# Patient Record
Sex: Male | Born: 1955 | Race: White | Hispanic: No | Marital: Married | Smoking: Never smoker
Health system: Southern US, Community
[De-identification: ages and names within clinical notes are randomized; demographics above are authoritative.]

## PROBLEM LIST (undated history)

## (undated) DIAGNOSIS — M1990 Unspecified osteoarthritis, unspecified site: Secondary | ICD-10-CM

## (undated) DIAGNOSIS — K219 Gastro-esophageal reflux disease without esophagitis: Secondary | ICD-10-CM

## (undated) DIAGNOSIS — E781 Pure hyperglyceridemia: Secondary | ICD-10-CM

## (undated) DIAGNOSIS — G4730 Sleep apnea, unspecified: Secondary | ICD-10-CM

## (undated) DIAGNOSIS — E785 Hyperlipidemia, unspecified: Secondary | ICD-10-CM

## (undated) DIAGNOSIS — I10 Essential (primary) hypertension: Secondary | ICD-10-CM

## (undated) DIAGNOSIS — T7840XA Allergy, unspecified, initial encounter: Secondary | ICD-10-CM

## (undated) DIAGNOSIS — E119 Type 2 diabetes mellitus without complications: Secondary | ICD-10-CM

## (undated) HISTORY — DX: Type 2 diabetes mellitus without complications: E11.9

## (undated) HISTORY — DX: Unspecified osteoarthritis, unspecified site: M19.90

## (undated) HISTORY — DX: Pure hyperglyceridemia: E78.1

## (undated) HISTORY — PX: VASECTOMY: SHX75

## (undated) HISTORY — DX: Hyperlipidemia, unspecified: E78.5

## (undated) HISTORY — DX: Allergy, unspecified, initial encounter: T78.40XA

## (undated) HISTORY — DX: Essential (primary) hypertension: I10

## (undated) HISTORY — DX: Sleep apnea, unspecified: G47.30

## (undated) HISTORY — DX: Gastro-esophageal reflux disease without esophagitis: K21.9

---

## 2002-11-18 HISTORY — PX: KNEE SURGERY: SHX244

## 2017-01-31 ENCOUNTER — Encounter: Payer: Self-pay | Admitting: Medical

## 2017-01-31 ENCOUNTER — Ambulatory Visit: Payer: Self-pay | Admitting: Medical

## 2017-01-31 VITALS — BP 124/84 | HR 81 | Temp 97.7°F | Resp 16 | Ht 71.0 in | Wt 240.0 lb

## 2017-01-31 DIAGNOSIS — I10 Essential (primary) hypertension: Secondary | ICD-10-CM

## 2017-01-31 DIAGNOSIS — E119 Type 2 diabetes mellitus without complications: Secondary | ICD-10-CM | POA: Insufficient documentation

## 2017-01-31 DIAGNOSIS — E1165 Type 2 diabetes mellitus with hyperglycemia: Secondary | ICD-10-CM | POA: Insufficient documentation

## 2017-01-31 DIAGNOSIS — Z760 Encounter for issue of repeat prescription: Secondary | ICD-10-CM

## 2017-01-31 DIAGNOSIS — R7303 Prediabetes: Secondary | ICD-10-CM

## 2017-01-31 MED ORDER — RAMIPRIL 5 MG PO CAPS: 5.0000 mg | ORAL_CAPSULE | Freq: Every day | ORAL | 0 refills | Status: DC

## 2017-01-31 MED ORDER — METFORMIN HCL ER 500 MG PO TB24: 500.0000 mg | ORAL_TABLET | Freq: Every day | ORAL | 0 refills | Status: DC

## 2017-01-31 MED ORDER — AMLODIPINE BESYLATE 10 MG PO TABS: 10.0000 mg | ORAL_TABLET | Freq: Every day | ORAL | 0 refills | Status: DC

## 2017-01-31 NOTE — Progress Notes (Signed)
   Subjective:    Patient ID: Cory Houston, male    DOB: 1956-08-12, 61 y.o.   MRN: [id number]  HPI Here for refill on bp pressure and prediabetic medication. New to [place] moving from [place]. 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     Review of Systems  Constitutional: Negative for chills and fever.  HENT: Negative for postnasal drip, rhinorrhea and sinus pressure.   Respiratory: Negative.   Cardiovascular: Negative.        Objective:   Physical Exam  Constitutional: He appears well-developed and well-nourished.  HENT:  Head: Normocephalic and atraumatic.  Eyes: EOM are normal. Pupils are equal, round, and reactive to light.  Neck: Normal range of motion.  Cardiovascular: Normal rate and regular rhythm.  Exam reveals no gallop and no friction rub.   No murmur heard. Pulmonary/Chest: Effort normal and breath sounds normal.          Assessment & Plan:  Refills completed for 90 days. Altace, metformin, amlodipine. All once a day. Pt setting up blood work next Thursday  And then will set up  primary care appointment. Return to the clinic as needed.

## 2017-02-06 ENCOUNTER — Other Ambulatory Visit: Payer: Self-pay

## 2017-02-06 DIAGNOSIS — R7303 Prediabetes: Secondary | ICD-10-CM

## 2017-02-06 DIAGNOSIS — Z0000 Encounter for general adult medical examination without abnormal findings: Secondary | ICD-10-CM

## 2017-02-06 DIAGNOSIS — I10 Essential (primary) hypertension: Secondary | ICD-10-CM

## 2017-02-06 LAB — POCT URINALYSIS DIPSTICK
Bilirubin, UA: NEGATIVE
Blood, UA: NEGATIVE
Glucose, UA: NEGATIVE
Ketones, UA: NEGATIVE
LEUKOCYTES UA: NEGATIVE
NITRITE UA: NEGATIVE
PROTEIN UA: NEGATIVE
Spec Grav, UA: 1.015 (ref 1.030–1.035)
UROBILINOGEN UA: 0.2 (ref ?–2.0)
pH, UA: 5 (ref 5.0–8.0)

## 2017-02-08 LAB — CMP12+LP+TP+TSH+6AC+PSA+CBC…
ALBUMIN: 4.9 g/dL (ref 3.5–5.5)
ALT: 81 IU/L — AB (ref 0–44)
AST: 40 IU/L (ref 0–40)
Albumin/Globulin Ratio: 1.8 (ref 1.2–2.2)
Alkaline Phosphatase: 163 IU/L — ABNORMAL HIGH (ref 39–117)
BILIRUBIN TOTAL: 0.5 mg/dL (ref 0.0–1.2)
BUN/Creatinine Ratio: 11 (ref 9–20)
BUN: 11 mg/dL (ref 6–24)
Basophils Absolute: 0 10*3/uL (ref 0.0–0.2)
Basos: 0 %
CALCIUM: 9.4 mg/dL (ref 8.7–10.2)
CHLORIDE: 98 mmol/L (ref 96–106)
CHOLESTEROL TOTAL: 192 mg/dL (ref 100–199)
Chol/HDL Ratio: 7.4 ratio units — ABNORMAL HIGH (ref 0.0–5.0)
Creatinine, Ser: 0.96 mg/dL (ref 0.76–1.27)
EOS (ABSOLUTE): 0.2 10*3/uL (ref 0.0–0.4)
Eos: 2 %
Estimated CHD Risk: 1.5 times avg. — ABNORMAL HIGH (ref 0.0–1.0)
FREE THYROXINE INDEX: 2.1 (ref 1.2–4.9)
GFR calc Af Amer: 100 mL/min/{1.73_m2} (ref >59)
GFR calc non Af Amer: 87 mL/min/{1.73_m2} (ref >59)
GGT: 29 IU/L (ref 0–65)
GLOBULIN, TOTAL: 2.8 g/dL (ref 1.5–4.5)
Glucose: 226 mg/dL — ABNORMAL HIGH (ref 65–99)
HDL: 26 mg/dL — ABNORMAL LOW (ref >39)
Hematocrit: 48 % (ref 37.5–51.0)
Hemoglobin: 16.6 g/dL (ref 13.0–17.7)
IRON: 96 ug/dL (ref 38–169)
Immature Grans (Abs): 0 10*3/uL (ref 0.0–0.1)
Immature Granulocytes: 0 %
LDH: 235 IU/L — ABNORMAL HIGH (ref 121–224)
LDL Calculated: 112 mg/dL — ABNORMAL HIGH (ref 0–99)
LYMPHS ABS: 1.4 10*3/uL (ref 0.7–3.1)
Lymphs: 21 %
MCH: 30.1 pg (ref 26.6–33.0)
MCHC: 34.6 g/dL (ref 31.5–35.7)
MCV: 87 fL (ref 79–97)
MONOS ABS: 0.5 10*3/uL (ref 0.1–0.9)
Monocytes: 7 %
NEUTROS ABS: 4.6 10*3/uL (ref 1.4–7.0)
Neutrophils: 70 %
PLATELETS: 205 10*3/uL (ref 150–379)
POTASSIUM: 4.5 mmol/L (ref 3.5–5.2)
PROSTATE SPECIFIC AG, SERUM: 0.3 ng/mL (ref 0.0–4.0)
Phosphorus: 2.5 mg/dL (ref 2.5–4.5)
RBC: 5.52 x10E6/uL (ref 4.14–5.80)
RDW: 13.9 % (ref 12.3–15.4)
Sodium: 137 mmol/L (ref 134–144)
T3 UPTAKE RATIO: 26 % (ref 24–39)
T4, Total: 8.1 ug/dL (ref 4.5–12.0)
TOTAL PROTEIN: 7.7 g/dL (ref 6.0–8.5)
TRIGLYCERIDES: 270 mg/dL — AB (ref 0–149)
TSH: 1.61 u[IU]/mL (ref 0.450–4.500)
Uric Acid: 5 mg/dL (ref 3.7–8.6)
VLDL Cholesterol Cal: 54 mg/dL — ABNORMAL HIGH (ref 5–40)
WBC: 6.7 10*3/uL (ref 3.4–10.8)

## 2017-02-08 LAB — VITAMIN D 25 HYDROXY (VIT D DEFICIENCY, FRACTURES): Vit D, 25-Hydroxy: 22.6 ng/mL — ABNORMAL LOW (ref 30.0–100.0)

## 2017-02-08 LAB — HGB A1C W/O EAG: HEMOGLOBIN A1C: 8.8 % — AB (ref 4.8–5.6)

## 2017-02-14 ENCOUNTER — Telehealth: Payer: Self-pay

## 2017-02-14 NOTE — Telephone Encounter (Signed)
See summary above.

## 2017-02-19 ENCOUNTER — Ambulatory Visit: Payer: Self-pay | Admitting: Medical

## 2017-02-19 ENCOUNTER — Encounter: Payer: Self-pay | Admitting: Medical

## 2017-02-19 VITALS — BP 120/78 | HR 93 | Temp 97.2°F | Ht 71.0 in | Wt 240.0 lb

## 2017-02-19 DIAGNOSIS — Z0000 Encounter for general adult medical examination without abnormal findings: Secondary | ICD-10-CM

## 2017-02-19 DIAGNOSIS — E119 Type 2 diabetes mellitus without complications: Secondary | ICD-10-CM

## 2017-02-19 DIAGNOSIS — G4730 Sleep apnea, unspecified: Secondary | ICD-10-CM

## 2017-02-19 DIAGNOSIS — I10 Essential (primary) hypertension: Secondary | ICD-10-CM

## 2017-02-19 DIAGNOSIS — E782 Mixed hyperlipidemia: Secondary | ICD-10-CM

## 2017-02-19 DIAGNOSIS — Z23 Encounter for immunization: Secondary | ICD-10-CM

## 2017-02-19 NOTE — Progress Notes (Addendum)
No addendum made, dx has patient as my primary care patient.  Subjective:    Patient ID: Cory Houston, male    DOB: 04/25/1956, 61 y.o.   MRN: [id number]  HPI 61 yo comes into day for primary physical and review of labs. 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    Review of Systems  Constitutional: Negative for chills, fatigue and fever.  HENT: Positive for nosebleeds. Negative for congestion, dental problem, ear pain and sneezing.   Eyes: Negative.   Respiratory: Negative.   Cardiovascular: Negative.   Gastrointestinal: Negative.   Endocrine: Negative.   Genitourinary: Negative.   Musculoskeletal: Negative.   Skin: Negative.   Neurological: Negative.   Hematological: Negative.   Psychiatric/Behavioral: Negative.   nosebleeds during the winter with dry heat.     Objective:   Physical Exam  Constitutional: He is oriented to person, place, and time. He appears well-developed and well-nourished.  HENT:  Head: Normocephalic and atraumatic.  Right Ear: External ear normal.  Left Ear: External ear normal.  Nose: Nose normal.  Mouth/Throat: Oropharynx is clear and moist.  Eyes: Conjunctivae and EOM are normal. Pupils are equal, round, and reactive to light. No scleral icterus.  Neck: Normal range of motion. Neck supple. No JVD present.  Cardiovascular: Normal rate, regular rhythm, normal heart sounds and intact distal pulses.  Exam reveals no gallop and no friction rub.   No murmur heard. Pulmonary/Chest: Effort normal and breath sounds normal.  Abdominal: Soft. Bowel sounds are normal.  Musculoskeletal: Normal range of motion.  Lymphadenopathy:    He has no cervical adenopathy.  Neurological: He is alert and oriented to person, place, and time.  Skin: Skin is warm and dry.  Nursing note and vitals reviewed.   Ventral hernia nontender , superior to umbilicus, soft and can be reduced.      Assessment & Plan:  Diabetes mellitus type II on  metformin XR 500mg  / day, elevated glucose and A1C 8.8 previously classified as prediabetic. Will set up referral for lifestyles counseling at  [facility]. Hypertension well controlled on Amlodipine and Ramipril. Hypertriglyceridemia  add Fish oil 3000 mg / day recheck in  6 months Obesity to follow heart healthy low cal diet and exercise at least 3 times/week.. Low vitamin D (22.6 ng/ml) to take otc vitamin D3 4000 IU/day recheck in one year. Sleep apnea will get patient retested last time was 15 yrs ago. Will refer for  sleep study to update patient diagnosis and treatment plan. Tdap updated. Recheck of A1C  And lipid panel and Comprehensive panel in 3 months and then follow up in clinic with me for recheck.

## 2017-02-20 MED ORDER — TETANUS-DIPHTH-ACELL PERTUSSIS 5-2.5-18.5 LF-MCG/0.5 IM SUSP
0.5000 mL | Freq: Once | INTRAMUSCULAR | Status: AC
  Administered 2017-02-19: 0.5 mL via INTRAMUSCULAR

## 2017-02-20 NOTE — Telephone Encounter (Signed)
Cory Houston stopped in the clinic to discuss lab results before going on a trip. Reassured lab results were ok and the PA would discuss them in more detail at his visit next week.

## 2017-02-24 NOTE — Progress Notes (Signed)
02/24/17 confirmed with pt that [facility] had contacted him about his sleep study coming up on 4/24. He is aware.  BA

## 2017-03-06 ENCOUNTER — Ambulatory Visit: Payer: Self-pay | Admitting: *Deleted

## 2017-03-10 ENCOUNTER — Encounter: Payer: BLUE CROSS/BLUE SHIELD | Attending: Medical | Admitting: *Deleted

## 2017-03-10 ENCOUNTER — Encounter: Payer: Self-pay | Admitting: *Deleted

## 2017-03-10 VITALS — BP 110/76 | Ht 71.0 in | Wt 235.0 lb

## 2017-03-10 DIAGNOSIS — E119 Type 2 diabetes mellitus without complications: Secondary | ICD-10-CM | POA: Diagnosis not present

## 2017-03-10 DIAGNOSIS — Z713 Dietary counseling and surveillance: Secondary | ICD-10-CM | POA: Diagnosis not present

## 2017-03-10 NOTE — Patient Instructions (Signed)
Check blood sugars 2 x day before breakfast and 2 hrs after supper every day  Exercise: Continue walking and weights for   40  minutes   3-7  days a week  Eat 3 meals day,  1-2  snacks a day Space meals 4-6 hours apart  Make an eye doctor appointment  Bring blood sugar records to the next /class  Return for classes on:

## 2017-03-11 ENCOUNTER — Ambulatory Visit: Payer: BLUE CROSS/BLUE SHIELD

## 2017-03-11 NOTE — Progress Notes (Signed)
Diabetes Self-Management Education  Visit Type: First/Initial  Appt. Start Time: 1605 Appt. End Time: 1705  03/10/2017  Mr. Cory Houston, identified by name and date of birth, is a 61 y.o. male with a diagnosis of Diabetes: Type 2.   ASSESSMENT  Blood pressure 110/76, height 5\' 11"  (1.803 m), weight 235 lb (106.6 kg). Body mass index is 32.78 kg/m.      Diabetes Self-Management Education - 03/10/17 1720      Visit Information   Visit Type First/Initial     Initial Visit   Diabetes Type Type 2   Are you currently following a meal plan? Yes   What type of meal plan do you follow? less carbs   Are you taking your medications as prescribed? Yes   Date Diagnosed 3 weeks     Health Coping   How would you rate your overall health? Good     Psychosocial Assessment   Patient Belief/Attitude about Diabetes Motivated to manage diabetes  "disappointed in myself"   Self-care barriers None   Self-management support Doctor's office;Family   Patient Concerns Nutrition/Meal planning;Medication;Monitoring;Healthy Lifestyle;Problem Solving;Glycemic Control;Weight Control   Special Needs None   Preferred Learning Style Hands on   Learning Readiness Change in progress   How often do you need to have someone help you when you read instructions, pamphlets, or other written materials from your doctor or pharmacy? 1 - Never   What is the last grade level you completed in school? BS degree     Pre-Education Assessment   Patient understands the diabetes disease and treatment process. Needs Instruction   Patient understands incorporating nutritional management into lifestyle. Needs Instruction   Patient undertands incorporating physical activity into lifestyle. Needs Review   Patient understands using medications safely. Needs Instruction   Patient understands monitoring blood glucose, interpreting and using results Needs Review   Patient understands prevention, detection, and treatment of  acute complications. Needs Instruction   Patient understands prevention, detection, and treatment of chronic complications. Needs Instruction   Patient understands how to develop strategies to address psychosocial issues. Needs Instruction   Patient understands how to develop strategies to promote health/change behavior. Needs Instruction     Complications   Last HgB A1C per patient/outside source 8.8 %  02/06/17   How often do you check your blood sugar? 1-2 times/day   Fasting Blood glucose range (mg/dL) 130-179  pt reports FBG's 130-135 mg/dL   Postprandial Blood glucose range (mg/dL) 130-179  pt reports pp 170's mg/dL   Have you had a dilated eye exam in the past 12 months? No   Have you had a dental exam in the past 12 months? Yes   Are you checking your feet? No     Dietary Intake   Breakfast Greek yogurt with berries; egg with cheese and wheat thins   Snack (morning) [place] yogurt with granola and berries   Lunch grilled chicken or pulled pork with zucchini, squash, asparagus, carrots, broccoli   Dinner same as lunch   Snack (evening) nuts   Beverage(s) water, diet Coke     Exercise   Exercise Type Moderate (swimming / aerobic walking);Light (walking / raking leaves)  walks 8-12 miles per day on his job; weight training 3 x week   How many days per week to you exercise? 3   How many minutes per day do you exercise? 40   Total minutes per week of exercise 120     Patient Education   Previous Diabetes  Education No   Disease state  Definition of diabetes, type 1 and 2, and the diagnosis of diabetes;Factors that contribute to the development of diabetes   Nutrition management  Role of diet in the treatment of diabetes and the relationship between the three main macronutrients and blood glucose level;Reviewed blood glucose goals for pre and post meals and how to evaluate the patients' food intake on their blood glucose level.   Physical activity and exercise  Role of exercise on  diabetes management, blood pressure control and cardiac health.   Medications Reviewed patients medication for diabetes, action, purpose, timing of dose and side effects.   Monitoring Taught/discussed recording of test results and interpretation of SMBG.;Identified appropriate SMBG and/or A1C goals.   Chronic complications Relationship between chronic complications and blood glucose control   Psychosocial adjustment Identified and addressed patients feelings and concerns about diabetes     Individualized Goals (developed by patient)   Reducing Risk Improve blood sugars Decrease medications Prevent diabetes complications Lose weight Lead a healthier lifestyle Become more fit     Outcomes   Expected Outcomes Demonstrated interest in learning. Expect positive outcomes   Future DMSE 2 wks      Individualized Plan for Diabetes Self-Management Training:   Learning Objective:  Patient will have a greater understanding of diabetes self-management. Patient education plan is to attend individual and/or group sessions per assessed needs and concerns.   Plan:   Patient Instructions  Check blood sugars 2 x day before breakfast and 2 hrs after supper every day Exercise: Continue walking and weights for   40  minutes   3-7  days a week Eat 3 meals day,  1-2  snacks a day Space meals 4-6 hours apart Make an eye doctor appointment Bring blood sugar records to the next /class  Expected Outcomes:  Demonstrated interest in learning. Expect positive outcomes  Education material provided:  General Meal Planning Guidelines Simple Meal Plan  If problems or questions, patient to contact team via:  Johny Drilling, RN, [place], CDE [phone]  Future DSME appointment: 2 wks  March 17, 2017 for Diabetes Class 1

## 2017-03-17 ENCOUNTER — Encounter: Payer: BLUE CROSS/BLUE SHIELD | Admitting: Dietician

## 2017-03-17 ENCOUNTER — Encounter: Payer: Self-pay | Admitting: Dietician

## 2017-03-17 VITALS — Ht 71.0 in | Wt 233.3 lb

## 2017-03-17 DIAGNOSIS — Z713 Dietary counseling and surveillance: Secondary | ICD-10-CM | POA: Diagnosis not present

## 2017-03-17 DIAGNOSIS — E119 Type 2 diabetes mellitus without complications: Secondary | ICD-10-CM

## 2017-03-17 NOTE — Progress Notes (Signed)

## 2017-03-24 ENCOUNTER — Encounter: Payer: BLUE CROSS/BLUE SHIELD | Attending: Medical | Admitting: Dietician

## 2017-03-24 VITALS — Wt 234.0 lb

## 2017-03-24 DIAGNOSIS — E119 Type 2 diabetes mellitus without complications: Secondary | ICD-10-CM | POA: Diagnosis not present

## 2017-03-24 DIAGNOSIS — Z713 Dietary counseling and surveillance: Secondary | ICD-10-CM | POA: Insufficient documentation

## 2017-03-24 NOTE — Progress Notes (Signed)

## 2017-03-31 ENCOUNTER — Encounter: Payer: Self-pay | Admitting: Dietician

## 2017-03-31 ENCOUNTER — Encounter: Payer: BLUE CROSS/BLUE SHIELD | Admitting: Dietician

## 2017-03-31 VITALS — BP 126/74 | Ht 71.0 in | Wt 228.4 lb

## 2017-03-31 DIAGNOSIS — E119 Type 2 diabetes mellitus without complications: Secondary | ICD-10-CM

## 2017-03-31 DIAGNOSIS — Z713 Dietary counseling and surveillance: Secondary | ICD-10-CM | POA: Diagnosis not present

## 2017-03-31 NOTE — Progress Notes (Signed)

## 2017-04-03 ENCOUNTER — Encounter: Payer: Self-pay | Admitting: *Deleted

## 2017-04-22 ENCOUNTER — Other Ambulatory Visit: Payer: Self-pay | Admitting: Medical

## 2017-04-23 ENCOUNTER — Other Ambulatory Visit: Payer: Self-pay | Admitting: Medical

## 2017-04-23 DIAGNOSIS — E119 Type 2 diabetes mellitus without complications: Secondary | ICD-10-CM

## 2017-04-23 MED ORDER — METFORMIN HCL ER 500 MG PO TB24: 500.0000 mg | ORAL_TABLET | Freq: Two times a day (BID) | ORAL | 0 refills | Status: DC

## 2017-04-23 NOTE — Telephone Encounter (Signed)
Patient returned my call. To have recheck of A1C in July 23 and lipid panel and kidney function ( will most likely start on lipid medication if elevated). He found Lifestyles  Counseling very helpful and has  Lost  20 lbs since his visit with me in April.  He is walking  8-12 miles /day for his exercise and he is watching his diet.appointment scheduled for follow up today.

## 2017-05-05 ENCOUNTER — Other Ambulatory Visit: Payer: Self-pay | Admitting: Medical

## 2017-05-05 DIAGNOSIS — I10 Essential (primary) hypertension: Secondary | ICD-10-CM

## 2017-05-05 MED ORDER — AMLODIPINE BESYLATE 10 MG PO TABS: 10.0000 mg | ORAL_TABLET | Freq: Every day | ORAL | 0 refills | Status: DC

## 2017-05-06 ENCOUNTER — Other Ambulatory Visit: Payer: Self-pay | Admitting: Medical

## 2017-05-06 DIAGNOSIS — I10 Essential (primary) hypertension: Secondary | ICD-10-CM

## 2017-05-06 MED ORDER — RAMIPRIL 5 MG PO CAPS: 5.0000 mg | ORAL_CAPSULE | Freq: Every day | ORAL | 0 refills | Status: DC

## 2017-05-06 NOTE — Telephone Encounter (Signed)
3 month refill on ramipril and amlodipine. It is time to check his A1C and recheck on his bp. Will have Belinda call patient for follow up appointment.

## 2017-05-07 ENCOUNTER — Encounter: Payer: Self-pay | Admitting: Medical

## 2017-06-06 ENCOUNTER — Other Ambulatory Visit: Payer: Self-pay

## 2017-06-09 ENCOUNTER — Ambulatory Visit: Payer: Self-pay | Admitting: Medical

## 2017-06-09 ENCOUNTER — Other Ambulatory Visit: Payer: Self-pay

## 2017-06-12 ENCOUNTER — Other Ambulatory Visit: Payer: Self-pay

## 2017-06-12 DIAGNOSIS — E782 Mixed hyperlipidemia: Secondary | ICD-10-CM

## 2017-06-12 DIAGNOSIS — E119 Type 2 diabetes mellitus without complications: Secondary | ICD-10-CM

## 2017-06-13 LAB — LIPID PANEL
Chol/HDL Ratio: 6.6 ratio — ABNORMAL HIGH (ref 0.0–5.0)
Cholesterol, Total: 178 mg/dL (ref 100–199)
HDL: 27 mg/dL — AB (ref >39)
LDL CALC: 118 mg/dL — AB (ref 0–99)
Triglycerides: 167 mg/dL — ABNORMAL HIGH (ref 0–149)
VLDL CHOLESTEROL CAL: 33 mg/dL (ref 5–40)

## 2017-06-13 LAB — HEMOGLOBIN A1C
ESTIMATED AVERAGE GLUCOSE: 140 mg/dL
Hgb A1c MFr Bld: 6.5 % — ABNORMAL HIGH (ref 4.8–5.6)

## 2017-06-17 ENCOUNTER — Encounter: Payer: Self-pay | Admitting: Medical

## 2017-06-17 ENCOUNTER — Ambulatory Visit: Payer: Self-pay | Admitting: Medical

## 2017-06-17 VITALS — BP 132/84 | HR 95 | Temp 96.9°F | Resp 16 | Ht 70.0 in | Wt 228.0 lb

## 2017-06-17 DIAGNOSIS — E785 Hyperlipidemia, unspecified: Secondary | ICD-10-CM

## 2017-06-17 DIAGNOSIS — E119 Type 2 diabetes mellitus without complications: Secondary | ICD-10-CM

## 2017-06-17 DIAGNOSIS — I10 Essential (primary) hypertension: Secondary | ICD-10-CM

## 2017-06-17 NOTE — Progress Notes (Signed)
   Subjective:    Patient ID: Cory Houston, male    DOB: Dec 22, 1955, 61 y.o.   MRN: [id number]  HPI  61 yo male Here for  Lab review of labs . Walking  8-9 miles through work, lost  12 lbs.since last visit. Has been moving and diet hasnot been the best over the last  2 weeks,  Moving from [place].  Wife now here in [place] and is now doing home cooking. Patient says he feels good.  Review of Systems  Constitutional: Negative.   HENT: Positive for nosebleeds.   Eyes: Negative.   Respiratory: Negative.   Cardiovascular: Negative.   Gastrointestinal: Negative for abdominal pain.  Genitourinary: Negative for dysuria and hematuria.  Musculoskeletal: Negative.   Skin: Negative.   Neurological: Negative.   Psychiatric/Behavioral: Negative.    Last episode one month ago when heat was on. Gets them with dry heat. " my nose just dries out".    Objective:   Physical Exam  Constitutional: He is oriented to person, place, and time. He appears well-developed and well-nourished.  HENT:  Head: Normocephalic and atraumatic.  Eyes: Pupils are equal, round, and reactive to light. Conjunctivae and EOM are normal.  Neck: Normal range of motion.  Neurological: He is alert and oriented to person, place, and time.  Skin: Skin is warm and dry.  Psychiatric: He has a normal mood and affect. His behavior is normal. Judgment and thought content normal.  Nursing note and vitals reviewed.         Assessment & Plan:  Diabetes Mellitus  Type 2 follow up appointment. A1C from 8.8 down to 6.5  Pt with 12 lb weight loss since last visit 3 months ago. Mixed dyslipidemia  improved  Saline Nasal spray for dry nose when using heat in house to moisturize nose.  Using an app called blood glucose tracker ./ blood pressure and HR   yesterday after eating 1. 5 hr was 88   Ate  [place] vegtables with chicken sausage.  Reviewed Heart rate and blood sugars and blood pressure and may have to adjust medication due to  patient losing weight.  Patient denies any weakness, dizziness, headache or presnyncope or syncope. If numbers run low he is to follow up sooner with me than 3 month follow up, reviewed this with the patient. Male exec panel placed in chart for  3 month follow up. Metabolic C panel not done on for this visit patient to return for blood work , will call patient with results.

## 2017-06-17 NOTE — Patient Instructions (Addendum)
Return to clinc for comprehensive metabolic panel.   Diabetes Mellitus and Exercise Exercising regularly is important for your overall health, especially when you have diabetes (diabetes mellitus). Exercising is not only about losing weight. It has many health benefits, such as increasing muscle strength and bone density and reducing body fat and stress. This leads to improved fitness, flexibility, and endurance, all of which result in better overall health. Exercise has additional benefits for people with diabetes, including:  Reducing appetite.  Helping to lower and control blood glucose.  Lowering blood pressure.  Helping to control amounts of fatty substances (lipids) in the blood, such as cholesterol and triglycerides.  Helping the body to respond better to insulin (improving insulin sensitivity).  Reducing how much insulin the body needs.  Decreasing the risk for heart disease by: ? Lowering cholesterol and triglyceride levels. ? Increasing the levels of good cholesterol. ? Lowering blood glucose levels.  What is my activity plan? Your health care provider or certified diabetes educator can help you make a plan for the type and frequency of exercise (activity plan) that works for you. Make sure that you:  Do at least 150 minutes of moderate-intensity or vigorous-intensity exercise each week. This could be brisk walking, biking, or water aerobics. ? Do stretching and strength exercises, such as yoga or weightlifting, at least 2 times a week. ? Spread out your activity over at least 3 days of the week.  Get some form of physical activity every day. ? Do not go more than 2 days in a row without some kind of physical activity. ? Avoid being inactive for more than 90 minutes at a time. Take frequent breaks to walk or stretch.  Choose a type of exercise or activity that you enjoy, and set realistic goals.  Start slowly, and gradually increase the intensity of your exercise over  time.  What do I need to know about managing my diabetes?  Check your blood glucose before and after exercising. ? If your blood glucose is higher than 240 mg/dL (13.3 mmol/L) before you exercise, check your urine for ketones. If you have ketones in your urine, do not exercise until your blood glucose returns to normal.  Know the symptoms of low blood glucose (hypoglycemia) and how to treat it. Your risk for hypoglycemia increases during and after exercise. Common symptoms of hypoglycemia can include: ? Hunger. ? Anxiety. ? Sweating and feeling clammy. ? Confusion. ? Dizziness or feeling light-headed. ? Increased heart rate or palpitations. ? Blurry vision. ? Tingling or numbness around the mouth, lips, or tongue. ? Tremors or shakes. ? Irritability.  Keep a rapid-acting carbohydrate snack available before, during, and after exercise to help prevent or treat hypoglycemia.  Avoid injecting insulin into areas of the body that are going to be exercised. For example, avoid injecting insulin into: ? The arms, when playing tennis. ? The legs, when jogging.  Keep records of your exercise habits. Doing this can help you and your health care provider adjust your diabetes management plan as needed. Write down: ? Food that you eat before and after you exercise. ? Blood glucose levels before and after you exercise. ? The type and amount of exercise you have done. ? When your insulin is expected to peak, if you use insulin. Avoid exercising at times when your insulin is peaking.  When you start a new exercise or activity, work with your health care provider to make sure the activity is safe for you, and to adjust  your insulin, medicines, or food intake as needed.  Drink plenty of water while you exercise to prevent dehydration or heat stroke. Drink enough fluid to keep your urine clear or pale yellow. This information is not intended to replace advice given to you by your health care provider.  Make sure you discuss any questions you have with your health care provider. Document Released: 01/25/2004 Document Revised: 05/24/2016 Document Reviewed: 04/15/2016 Elsevier Interactive Patient Education  2018 [employer].

## 2017-06-20 ENCOUNTER — Other Ambulatory Visit: Payer: Self-pay | Admitting: Medical

## 2017-06-20 DIAGNOSIS — E119 Type 2 diabetes mellitus without complications: Secondary | ICD-10-CM

## 2017-06-25 ENCOUNTER — Other Ambulatory Visit: Payer: Self-pay

## 2017-07-01 ENCOUNTER — Encounter: Payer: Self-pay | Admitting: Medical

## 2017-07-01 NOTE — Telephone Encounter (Signed)
Refilled 1 year supply metformin ER 500mg  po BID #180 RF0 for patient electronic Rx pharmacy of choice.  Patient to follow up with PCM in 6-12 months as recently had visit last month and HgbA1c improved from 8.8 to 6.5.  Discussed diet, continuing exercise and medications as prescribed along with avoiding weight gain via telephone.  Patient verbalized understanding information/instructions, agreed with plan of care and had no further questions at this time.

## 2017-07-08 NOTE — Addendum Note (Signed)
Addended by: RATCLIFFE, Nira Conn R on: 07/08/2017 11:12 AM   Modules accepted: Orders

## 2017-08-06 ENCOUNTER — Other Ambulatory Visit: Payer: Self-pay | Admitting: Medical

## 2017-08-06 DIAGNOSIS — I10 Essential (primary) hypertension: Secondary | ICD-10-CM

## 2017-08-08 ENCOUNTER — Other Ambulatory Visit: Payer: Self-pay | Admitting: Adult Health

## 2017-08-08 DIAGNOSIS — I10 Essential (primary) hypertension: Secondary | ICD-10-CM

## 2017-08-08 MED ORDER — RAMIPRIL 5 MG PO CAPS: 5.0000 mg | ORAL_CAPSULE | Freq: Every day | ORAL | 0 refills | Status: DC

## 2017-08-08 MED ORDER — AMLODIPINE BESYLATE 10 MG PO TABS: 10.0000 mg | ORAL_TABLET | Freq: Every day | ORAL | 0 refills | Status: DC

## 2017-08-08 NOTE — Progress Notes (Signed)
Meds ordered this encounter  Medications  . amLODipine (NORVASC) 10 MG tablet    Sig: Take 1 tablet (10 mg total) by mouth daily.    Dispense:  30 tablet    Refill:  0  . ramipril (ALTACE) 5 MG capsule    Sig: Take 1 capsule (5 mg total) by mouth daily.    Dispense:  30 capsule    Refill:  0    Discussed with provider Heather Ratcliiff  PA and will refill as above - E prescribed to pharmacy on file . Patient will need an appointment for labs and appointment with [name], PA-C .[name], PA-C will have secretary call patient for appointment.

## 2017-08-11 ENCOUNTER — Encounter: Payer: Self-pay | Admitting: Medical

## 2017-08-11 ENCOUNTER — Telehealth: Payer: Self-pay | Admitting: Medical

## 2017-08-11 DIAGNOSIS — I10 Essential (primary) hypertension: Secondary | ICD-10-CM

## 2017-08-11 DIAGNOSIS — E119 Type 2 diabetes mellitus without complications: Secondary | ICD-10-CM

## 2017-08-11 NOTE — Telephone Encounter (Signed)
Patient  3 month follow up needed end of October. MyChart message sent. Labs placed (fasting) , to be done before appointment.

## 2017-08-14 ENCOUNTER — Telehealth: Payer: Self-pay | Admitting: Adult Health

## 2017-08-14 ENCOUNTER — Other Ambulatory Visit: Payer: Self-pay | Admitting: Medical

## 2017-08-14 DIAGNOSIS — I10 Essential (primary) hypertension: Secondary | ICD-10-CM

## 2017-08-14 NOTE — Telephone Encounter (Addendum)
Called patient on 08/14/17 At 2:30 pm received refill for Altace from pharmacy. Called patient he has one week of Altace left.  Per [name], PA-C note patient is due for labs at the end of October. He did not receive my-chart message but is in agreement to schedule labs and follow up appointment one week after with Lily Peer, Estill Dooms, PA-C.   Last labs 02/06/17 as below.  BUN 6 - 24 mg/dL 11   Creatinine, Ser 0.76 - 1.27 mg/dL 0.96   GFR calc non Af Amer >59 mL/min/1.73 87   GFR calc Af Amer >59 mL/min/1.73 100   BUN/Creatinine Ratio 9 - 20 11   Sodium 134 - 144 mmol/L 137   Potassium 3.5 - 5.2 mmol/L 4.5    Refills as below Meds ordered this encounter  Medications  . ramipril (ALTACE) 5 MG capsule    Sig: Take 1 capsule (5 mg total) by mouth daily.    Dispense:  30 capsule    Refill:  0    Appointment next scheduled for 09/12/17  Labs have already been pre-ordered for future in EPIC by [name], PA-C   Patient verbalized understanding to keep his lab appointment and follow up with [name], PA-C  And report any new or changing symptoms of instructions and denies any further questions at this time.

## 2017-08-15 MED ORDER — RAMIPRIL 5 MG PO CAPS: 5.0000 mg | ORAL_CAPSULE | Freq: Every day | ORAL | 0 refills | Status: DC

## 2017-08-19 ENCOUNTER — Telehealth: Payer: Self-pay

## 2017-08-19 NOTE — Telephone Encounter (Signed)
Called patient confirmed that he has lab draw appointment on 10/26 and follow up with provider in November to review results.  Patient has enough medication to last until next appointment.

## 2017-08-19 NOTE — Addendum Note (Signed)
Addended by: Minnie Legros, Nira Conn R on: 08/19/2017 10:29 AM   Modules accepted: Orders

## 2017-09-12 ENCOUNTER — Other Ambulatory Visit: Payer: Self-pay

## 2017-09-12 ENCOUNTER — Telehealth: Payer: Self-pay

## 2017-09-12 DIAGNOSIS — E119 Type 2 diabetes mellitus without complications: Secondary | ICD-10-CM

## 2017-09-12 DIAGNOSIS — I10 Essential (primary) hypertension: Secondary | ICD-10-CM

## 2017-09-12 DIAGNOSIS — E785 Hyperlipidemia, unspecified: Secondary | ICD-10-CM

## 2017-09-12 NOTE — Telephone Encounter (Signed)
Pt states that his CPAP has died and is no longer working. Pt states that he has had a CPAP for 14 years. Pt states that Daryll Drown, PA-C has requested a Sleep study, however; the pt is not able to afford one at this time. He is requesting an Rx for the CPAP.

## 2017-09-12 NOTE — Telephone Encounter (Signed)
Phone call to follow up with patient about broken CPAP machine and sleep study. Pt was instructed to come in Monday morning for an appointment with our PA -  [facility]. Office appointment scheduled for Monday 09/15/2017 at 11:00 am.

## 2017-09-13 LAB — COMPREHENSIVE METABOLIC PANEL
A/G RATIO: 1.8 (ref 1.2–2.2)
ALBUMIN: 4.8 g/dL (ref 3.5–5.5)
ALT: 39 IU/L (ref 0–44)
AST: 24 IU/L (ref 0–40)
Alkaline Phosphatase: 132 IU/L — ABNORMAL HIGH (ref 39–117)
BILIRUBIN TOTAL: 0.4 mg/dL (ref 0.0–1.2)
BUN / CREAT RATIO: 14 (ref 9–20)
BUN: 15 mg/dL (ref 6–24)
CHLORIDE: 101 mmol/L (ref 96–106)
CO2: 23 mmol/L (ref 20–29)
Calcium: 9.2 mg/dL (ref 8.7–10.2)
Creatinine, Ser: 1.04 mg/dL (ref 0.76–1.27)
GFR calc non Af Amer: 78 mL/min/{1.73_m2} (ref >59)
GFR, EST AFRICAN AMERICAN: 90 mL/min/{1.73_m2} (ref >59)
Globulin, Total: 2.7 g/dL (ref 1.5–4.5)
Glucose: 145 mg/dL — ABNORMAL HIGH (ref 65–99)
POTASSIUM: 4.5 mmol/L (ref 3.5–5.2)
SODIUM: 139 mmol/L (ref 134–144)
TOTAL PROTEIN: 7.5 g/dL (ref 6.0–8.5)

## 2017-09-13 LAB — HGB A1C W/O EAG: Hgb A1c MFr Bld: 6.1 % — ABNORMAL HIGH (ref 4.8–5.6)

## 2017-09-15 ENCOUNTER — Ambulatory Visit: Payer: Self-pay | Admitting: Medical

## 2017-09-15 NOTE — Telephone Encounter (Signed)
Patient to make appointment.

## 2017-09-17 ENCOUNTER — Other Ambulatory Visit: Payer: Self-pay

## 2017-09-23 ENCOUNTER — Ambulatory Visit: Payer: Self-pay | Admitting: Medical

## 2017-09-23 ENCOUNTER — Encounter: Payer: Self-pay | Admitting: Medical

## 2017-09-23 VITALS — BP 112/82 | HR 93 | Temp 98.1°F | Resp 16 | Ht 70.0 in | Wt 231.0 lb

## 2017-09-23 DIAGNOSIS — E782 Mixed hyperlipidemia: Secondary | ICD-10-CM

## 2017-09-23 DIAGNOSIS — I10 Essential (primary) hypertension: Secondary | ICD-10-CM

## 2017-09-23 DIAGNOSIS — G4730 Sleep apnea, unspecified: Secondary | ICD-10-CM

## 2017-09-23 DIAGNOSIS — E559 Vitamin D deficiency, unspecified: Secondary | ICD-10-CM

## 2017-09-23 DIAGNOSIS — E119 Type 2 diabetes mellitus without complications: Secondary | ICD-10-CM

## 2017-09-23 NOTE — Patient Instructions (Addendum)
Health Maintenance, Male A healthy lifestyle and preventive care is important for your health and wellness. Ask your health care provider about what schedule of regular examinations is right for you. What should I know about weight and diet? Eat a Healthy Diet  Eat plenty of vegetables, fruits, whole grains, low-fat dairy products, and lean protein.  Do not eat a lot of foods high in solid fats, added sugars, or salt.  Maintain a Healthy Weight Regular exercise can help you achieve or maintain a healthy weight. You should:  Do at least 150 minutes of exercise each week. The exercise should increase your heart rate and make you sweat (moderate-intensity exercise).  Do strength-training exercises at least twice a week.  Watch Your Levels of Cholesterol and Blood Lipids  Have your blood tested for lipids and cholesterol every 5 years starting at 59 years of age. If you are at high risk for heart disease, you should start having your blood tested when you are 59 years old. You may need to have your cholesterol levels checked more often if: ? Your lipid or cholesterol levels are high. ? You are older than 59 years of age. ? You are at high risk for heart disease.  What should I know about cancer screening? Many types of cancers can be detected early and may often be prevented. Lung Cancer  You should be screened every year for lung cancer if: ? You are a current smoker who has smoked for at least 30 years. ? You are a former smoker who has quit within the past 15 years.  Talk to your health care provider about your screening options, when you should start screening, and how often you should be screened.  Colorectal Cancer  Routine colorectal cancer screening usually begins at 59 years of age and should be repeated every 5-10 years until you are 59 years old. You may need to be screened more often if early forms of precancerous polyps or small growths are found. Your health care provider  may recommend screening at an earlier age if you have risk factors for colon cancer.  Your health care provider may recommend using home test kits to check for hidden blood in the stool.  A small camera at the end of a tube can be used to examine your colon (sigmoidoscopy or colonoscopy). This checks for the earliest forms of colorectal cancer.  Prostate and Testicular Cancer  Depending on your age and overall health, your health care provider may do certain tests to screen for prostate and testicular cancer.  Talk to your health care provider about any symptoms or concerns you have about testicular or prostate cancer.  Skin Cancer  Check your skin from head to toe regularly.  Tell your health care provider about any new moles or changes in moles, especially if: ? There is a change in a mole's size, shape, or color. ? You have a mole that is larger than a pencil eraser.  Always use sunscreen. Apply sunscreen liberally and repeat throughout the day.  Protect yourself by wearing long sleeves, pants, a wide-brimmed hat, and sunglasses when outside.  What should I know about heart disease, diabetes, and high blood pressure?  If you are 18-39 years of age, have your blood pressure checked every 3-5 years. If you are 40 years of age or older, have your blood pressure checked every year. You should have your blood pressure measured twice-once when you are at a hospital or clinic, and once when   you are not at a hospital or clinic. Record the average of the two measurements. To check your blood pressure when you are not at a hospital or clinic, you can use: ? An automated blood pressure machine at a pharmacy. ? A home blood pressure monitor.  Talk to your health care provider about your target blood pressure.  If you are between 88-91 years old, ask your health care provider if you should take aspirin to prevent heart disease.  Have regular diabetes screenings by checking your fasting blood  sugar level. ? If you are at a normal weight and have a low risk for diabetes, have this test once every three years after the age of 89. ? If you are overweight and have a high risk for diabetes, consider being tested at a younger age or more often.  A one-time screening for abdominal aortic aneurysm (AAA) by ultrasound is recommended for men aged 24-75 years who are current or former smokers. What should I know about preventing infection? Hepatitis B If you have a higher risk for hepatitis B, you should be screened for this virus. Talk with your health care provider to find out if you are at risk for hepatitis B infection. Hepatitis C Blood testing is recommended for:  Everyone born from [birth year] through 1963.  Anyone with known risk factors for hepatitis C.  Sexually Transmitted Diseases (STDs)  You should be screened each year for STDs including gonorrhea and chlamydia if: ? You are sexually active and are younger than 59 years of age. ? You are older than 59 years of age and your health care provider tells you that you are at risk for this type of infection. ? Your sexual activity has changed since you were last screened and you are at an increased risk for chlamydia or gonorrhea. Ask your health care provider if you are at risk.  Talk with your health care provider about whether you are at high risk of being infected with HIV. Your health care provider may recommend a prescription medicine to help prevent HIV infection.  What else can I do?  Schedule regular health, dental, and eye exams.  Stay current with your vaccines (immunizations).  Do not use any tobacco products, such as cigarettes, chewing tobacco, and e-cigarettes. If you need help quitting, ask your health care provider.  Limit alcohol intake to no more than 2 drinks per day. One drink equals 12 ounces of beer, 5 ounces of wine, or 1 ounces of hard liquor.  Do not use street drugs.  Do not share needles.  Ask your  health care provider for help if you need support or information about quitting drugs.  Tell your health care provider if you often feel depressed.  Tell your health care provider if you have ever been abused or do not feel safe at home. This information is not intended to replace advice given to you by your health care provider. Make sure you discuss any questions you have with your health care provider. Document Released: 05/02/2008 Document Revised: 07/03/2016 Document Reviewed: 08/08/2015 Elsevier Interactive Patient Education  2018 [employer]. Sleep Apnea Sleep apnea is a condition that affects breathing. People with sleep apnea have moments during sleep when their breathing pauses briefly or gets shallow. Sleep apnea can cause these symptoms:  Trouble staying asleep.  Sleepiness or tiredness during the day.  Irritability.  Loud snoring.  Morning headaches.  Trouble concentrating.  Forgetting things.  Less interest in sex.  Being sleepy  for no reason.  Mood swings.  Personality changes.  Depression.  Waking up a lot during the night to pee (urinate).  Dry mouth.  Sore throat.  Follow these instructions at home:  Make any changes in your routine that your doctor recommends.  Eat a healthy, well-balanced diet.  Take over-the-counter and prescription medicines only as told by your doctor.  Avoid using alcohol, calming medicines (sedatives), and narcotic medicines.  Take steps to lose weight if you are overweight.  If you were given a machine (device) to use while you sleep, use it only as told by your doctor.  Do not use any tobacco products, such as cigarettes, chewing tobacco, and e-cigarettes. If you need help quitting, ask your doctor.  Keep all follow-up visits as told by your doctor. This is important. Contact a doctor if:  The machine that you were given to use during sleep is uncomfortable or does not seem to be working.  Your symptoms do not  get better.  Your symptoms get worse. Get help right away if:  Your chest hurts.  You have trouble breathing in enough air (shortness of breath).  You have an uncomfortable feeling in your back, arms, or stomach.  You have trouble talking.  One side of your body feels weak.  A part of your face is hanging down (drooping). These symptoms may be an emergency. Do not wait to see if the symptoms will go away. Get medical help right away. Call your local emergency services (911 in the [place] Do not drive yourself to the hospital. This information is not intended to replace advice given to you by your health care provider. Make sure you discuss any questions you have with your health care provider. Document Released: 08/13/2008 Document Revised: 06/30/2016 Document Reviewed: 08/14/2015 Elsevier Interactive Patient Education  [employer].

## 2017-09-23 NOTE — Progress Notes (Signed)
   Subjective:    Patient ID: Cory Houston, male    DOB: [birth year], 61 y.o.   MRN: [id number]  HPI  Here to review labs Imporved A1C Was in the 115-120 now since beginning of September 125-140's in the mornings. Snacks don't seem to matter, eating cooks and sometimes berries for evening snack.. Checking after dinnner 2 hours post prandial 140-155 depending on what he eats he avoids pasta. Increased stress last 4 weeks with teenagers girl 59 yo/ boy 59 yo  (with some problems of drinking and drugs)  61 yo machine got it when first diagnosed, Broke last week. Has never been retested or titratied since then..     Review of Systems  Constitutional: Negative for chills and fever.  HENT: Negative for congestion.   Eyes: Negative for discharge and itching.  Respiratory: Negative for cough and shortness of breath.   Cardiovascular: Negative for chest pain, palpitations and leg swelling.  Gastrointestinal: Negative for abdominal pain.  Endocrine: Negative for cold intolerance and heat intolerance.  Genitourinary: Negative for dysuria.  Musculoskeletal: Negative for myalgias.  Skin: Negative for rash.  Allergic/Immunologic: Positive for environmental allergies, food allergies and immunocompromised state.  Neurological: Negative for dizziness, speech difficulty, light-headedness and headaches.  Hematological: Negative for adenopathy.  Psychiatric/Behavioral: Negative for behavioral problems, decreased concentration, self-injury and suicidal ideas. The patient is not nervous/anxious.        Objective:   Physical Exam  Constitutional: He is oriented to person, place, and time. He appears well-developed and well-nourished.  HENT:  Head: Normocephalic and atraumatic.  Eyes: Conjunctivae and EOM are normal. Pupils are equal, round, and reactive to light.  Neck: Normal range of motion.  Cardiovascular: Normal rate, regular rhythm and normal heart sounds.  Pulmonary/Chest: Effort normal and  breath sounds normal.  Neurological: He is alert and oriented to person, place, and time.  Skin: Skin is warm and dry.  Psychiatric: He has a normal mood and affect. His behavior is normal. Judgment and thought content normal.  Nursing note and vitals reviewed.         Assessment & Plan:  Hypertension  Well controlled,  reviewed labs. Diabetes Type 2 A1C continuing to trend downward. Sleep Apnea will discuss with Dr. Rosanna Randy.titration study is fine. Form completed and Faxed.  Eye  Exam patient will schedule last eye exam  2 years ago given eye glasses, with bifocals , slight magnification on upper glass.given by [facility] number.  Colonoscopy patient would like to wait till Jan 2019 due to insurance,  Next set of labs needs CMP, Vitamin D level and lipids levels. Reviewed how much in berries patient can have ,  3/4 of a cup sliced or a cup raw. Avoid cookies. Patient Verbalizes understanding and has no questions at discharge.

## 2017-09-24 ENCOUNTER — Telehealth: Payer: Self-pay

## 2017-09-24 NOTE — Telephone Encounter (Signed)
Called patient advised that paperwork for sleep study was faxed to [facility].  He will be contacted by [facility] directly to schedule appointment.  I told him that if he hadn't heard from [facility] in 2 weeks to call our office to follow up.  Patient verbalized understanding.

## 2017-10-07 ENCOUNTER — Encounter: Payer: Self-pay | Admitting: Medical

## 2017-10-31 ENCOUNTER — Encounter: Payer: Self-pay | Admitting: Adult Health

## 2017-10-31 ENCOUNTER — Telehealth: Payer: Self-pay | Admitting: Adult Health

## 2017-10-31 ENCOUNTER — Ambulatory Visit: Payer: Self-pay | Admitting: Adult Health

## 2017-10-31 VITALS — BP 130/78 | HR 65 | Temp 98.2°F | Resp 18 | Wt 236.8 lb

## 2017-10-31 DIAGNOSIS — H6501 Acute serous otitis media, right ear: Secondary | ICD-10-CM

## 2017-10-31 MED ORDER — FLUTICASONE PROPIONATE 50 MCG/ACT NA SUSP: 2.0000 | Freq: Every day | NASAL | 6 refills | Status: DC

## 2017-10-31 MED ORDER — AMOXICILLIN-POT CLAVULANATE 875-125 MG PO TABS: 1.0000 | ORAL_TABLET | Freq: Two times a day (BID) | ORAL | 0 refills | Status: DC

## 2017-10-31 MED ORDER — LORATADINE 10 MG PO TABS: 10.0000 mg | ORAL_TABLET | Freq: Every day | ORAL | 11 refills | Status: DC

## 2017-10-31 NOTE — Progress Notes (Addendum)
Subjective:     Patient ID: Cory Houston, male   DOB: 1955/12/22, 61 y.o.   MRN: [id number]  HPI  Patient is a 61 year old male in no acute distress who presents with post nasal drip, coughing due to postnasal drip. Denies chest congestion.  He reports constantly clearing your throat.  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 He reports doing this near 80 hours this week.   He reports he kept his wife awake last night with symptoms but he reports he slept well.  He wears CPAP machine nightly per his report though he is waiting on a new machine and was referred to sleep medication. His wife reported he was having some mild snoring while wearing his machine last night. He reports he has not heard from sleep center on his referral.   Patient  denies any fever, chills, rash, chest pain, shortness of breath, nausea, vomiting, or diarrhea.   Blood pressure 130/78, pulse 65, temperature 98.2 F (36.8 C), temperature source Tympanic, resp. rate 18, weight 236 lb 12.8 oz (107.4 kg), SpO2 99 %.    Current Outpatient Medications:  .  amLODipine (NORVASC) 10 MG tablet, Take 1 tablet (10 mg total) by mouth daily., Disp: 30 tablet, Rfl: 0 .  Cholecalciferol (VITAMIN D-3) 5000 units TABS, Take 1 tablet by mouth daily., Disp: , Rfl:  .  metFORMIN (GLUCOPHAGE-XR) 500 MG 24 hr tablet, Take 1 tablet (500 mg total) by mouth 2 (two) times daily., Disp: 720 tablet, Rfl: 0 .  Multiple Vitamin (MULTIVITAMIN) tablet, Take 1 tablet by mouth daily., Disp: , Rfl:  .  Omega-3 Fatty Acids (FISH OIL) 1200 MG CAPS, Take 2,400 mg by mouth daily after breakfast. 1200 mg in the evening after supper, Disp: , Rfl:  .  omeprazole (PRILOSEC) 20 MG capsule, Take 20 mg by mouth daily., Disp: , Rfl:  .  ramipril (ALTACE) 5 MG capsule, Take 1 capsule (5 mg total) by mouth daily., Disp: 30 capsule, Rfl: 0  Review of Systems  HENT: Positive for nosebleeds (occaional  with heat in house/ had one yesterday stopped easily he has not used saline yet this year. ), postnasal drip and rhinorrhea. Negative for congestion, dental problem, drooling, ear discharge, ear pain (fullness right ear ), facial swelling, hearing loss, mouth sores, sinus pressure, sinus pain, sneezing, sore throat, tinnitus, trouble swallowing and voice change.   Eyes: Negative.   Respiratory: Negative.   Cardiovascular: Negative.   Gastrointestinal: Negative.   Musculoskeletal: Negative.        Mild upper body aches   Psychiatric/Behavioral: Negative.        Objective:   Physical Exam  Constitutional: He is oriented to person, place, and time. Vital signs are normal. He appears well-developed and well-nourished. He is active.  Non-toxic appearance. He does not have a sickly appearance. He does not appear ill. No distress.  HENT:  Head: Normocephalic and atraumatic.  Right Ear: There is drainage. No swelling. Tympanic membrane is erythematous. A middle ear effusion is present. Decreased hearing (muffled/full  at times / able to hear normally per patient ) is noted.  Left Ear: Hearing, tympanic membrane, external ear and ear canal normal. No swelling. Tympanic membrane is not erythematous.  No middle ear effusion.  Nose: Rhinorrhea present. No mucosal edema. Right sinus exhibits no maxillary sinus tenderness and no frontal sinus tenderness. Left sinus exhibits no maxillary sinus tenderness and no  frontal sinus tenderness.  Mouth/Throat: Uvula is midline and mucous membranes are normal. Posterior oropharyngeal erythema present. No oropharyngeal exudate, posterior oropharyngeal edema or tonsillar abscesses.  Right ear hearing muffled at times per patient for a few weeks.  Large amount of cerumen in ear canal able to partially visualize erythema of right tympanic membrane/ scant amount of yellow drainage around on tympanic membrane in areas   He denies any trauma.  Denies pain in ear just  fullness.   Left ear normal exam     Eyes: Conjunctivae and EOM are normal. Pupils are equal, round, and reactive to light. Right eye exhibits no discharge. Left eye exhibits no discharge. No scleral icterus.  Neck: Normal range of motion. Neck supple. No JVD present. No tracheal deviation present.  Cardiovascular: Normal rate, regular rhythm, normal heart sounds and intact distal pulses. Exam reveals no gallop and no friction rub.  No murmur heard. Pulmonary/Chest: Effort normal and breath sounds normal. No stridor. No respiratory distress. He has no wheezes. He has no rales. He exhibits no tenderness.  Abdominal: Soft. Bowel sounds are normal. He exhibits no distension.  Musculoskeletal: Normal range of motion.  Patient moves on and off of exam table and in room without difficulty. Gait is normal in hall and in room. Patient is oriented to person place time and situation. Patient answers questions appropriately and engages in conversation.   Lymphadenopathy:       Head (right side): Preauricular (minimal enlargement ) adenopathy present.    He has no cervical adenopathy.    He has no axillary adenopathy.  Neurological: He is alert and oriented to person, place, and time. He has normal strength.  Skin: Skin is warm and dry. No rash noted. He is not diaphoretic. No cyanosis or erythema. No pallor. Nails show no clubbing.  Psychiatric: He has a normal mood and affect. His speech is normal and behavior is normal. Judgment and thought content normal. Cognition and memory are normal.  Nursing note and vitals reviewed.      Assessment:     Right acute serous otitis media, recurrence not specified      Plan:     Meds ordered this encounter  Medications  . amoxicillin-clavulanate (AUGMENTIN) 875-125 MG tablet    Sig: Take 1 tablet by mouth 2 (two) times daily.    Dispense:  20 tablet    Refill:  0  . fluticasone (FLONASE) 50 MCG/ACT nasal spray    Sig: Place 2 sprays into both  nostrils daily.    Dispense:  16 g    Refill:  6  . loratadine (CLARITIN) 10 MG tablet    Sig: Take 1 tablet (10 mg total) by mouth daily.    Dispense:  30 tablet    Refill:  11   Right otitis media / large amount of dried was present in ear canal - recommend recheck of right ear and possible ear lavage if present at that time. He will schedule an appointment for after the office reopens after Christmas and otherwise follow the instructions below .Marland Kitchen No sudafed or equivalent with hypertension.  Education sheet ( AVS) given.   Will have nursing follow up on Sleep center referral as he says he has not been contacted by sleep center for appointment. He is advised he needs to be seen by this specialist as untreated sleep apnea or wearing a broken machine can cause serious side effects not limited to cardiomegaly, heart attack, lung damage and even death. Referral  was received by [facility] on 09/29/17.    Advised to return to clinic for an appointment if no improvement within 72 hours or if any symptoms change or worsen. Advised ER or urgent Care if after hours or on weekend. 911 for emergency symptoms at any time.      Patient verbalized understanding of all instructions given and denies any further questions at this time.

## 2017-10-31 NOTE — Progress Notes (Signed)
Pt presents to clinic for c/o post nasal drip for the past week. Denies sore throat, cough, nasal drainage/congestion, or fever.   Subjective:    Patient ID: Cory Houston, male    DOB: 08/24/1956, 61 y.o.   MRN: [id number]  HPI    Review of Systems     Objective:   Physical Exam        Assessment & Plan:

## 2017-10-31 NOTE — Telephone Encounter (Signed)
10/31/17 provider asked clinic manager to follow up on referral  As patient reported he has not received a call from the sleep study referral.  Office note book shows referral was received by  [facility] at time provider sent and form was faxed over as office had requested.   Call was made by Mallory Shirk RN clinical manager  to [facility] [phone] ext [phone]  Reports patient was left a message on  11/12,   11/16 and 11/27 and mailed letter to  patient on 11/30 as he had no responded. Juliann Pulse and provider verified patients contact number is the same in Epic and on referral form and it is correct and the number patient answered on today.   Call was then made to patient by this provider and informed  [facility] staff Mendel Ryder [phone] ext [phone]  Reports patient was left a message on  11/12,   11/16 and 11/27 and mailed letter to  patient on 11/30 as he had no responded.   He also had a referral from April Ratcliffe, [place], [place] and it is documented he did not go due to cost.  Patient states " ummm I will check on this"  He also reports that " my machine is now fixed " . He did report snoring this week on CPAP could be due to post nasal drip however could also be cause by poor mask or poor titration  it is the recommendation of this provider that he be seen by the sleep med center for evaluation.  He tells provider on the phone that he will follow up with [facility]- telephone number and address given to patient. He is advised to contact this office if he has any difficulty scheduling this appointment.   Patient verbalized understanding of all instructions given and denies any further questions at this time.

## 2017-10-31 NOTE — Patient Instructions (Signed)
Cold Sore A cold sore, also called a fever blister, is a skin infection that is caused by a virus. This infection causes small, fluid-filled sores to form inside of the mouth or on the lips, gums, nose, chin, or cheeks. Cold sores can spread to other parts of the body, such as the eyes or fingers. Cold sores can be spread or passed from person to person (contagious) until the sores crust over completely. Cold sores can be spread through close contact, such as kissing or sharing a drinking glass. Follow these instructions at home: Medicines  Take or apply over-the-counter and prescription medicines only as told by your doctor.  Use a cotton-tip swab to apply creams or gels to your sores. Sore Care  Do not touch the sores or pick the scabs.  Wash your hands often. Do not touch your eyes without washing your hands first.  Keep the sores clean and dry.  If directed, apply ice to the sores:  Put ice in a plastic bag.  Place a towel between your skin and the bag.  Leave the ice on for 20 minutes, 2-3 times per day. Lifestyle  Do not kiss, have oral sex, or share personal items until your sores heal.  Eat a soft, bland diet. Avoid eating hot, cold, or salty foods. These can hurt your mouth.  Use a straw if it hurts to drink out of a glass.  Avoid the sun and limit your stress if these things trigger outbreaks. If sun causes cold sores, apply sunscreen on your lips before being out in the sun. Contact a doctor if:  You have symptoms for more than two weeks.  You have pus coming from the sores.  You have redness that is spreading.  You have pain or irritation in your eye.  You get sores on your genitals.  Your sores do not heal within two weeks.  You get cold sores often. Get help right away if:  You have a fever and your symptoms suddenly get worse.  You have a headache and confusion. This information is not intended to replace advice given to you by your health care  provider. Make sure you discuss any questions you have with your health care provider. Document Released: 05/05/2012 Document Revised: 04/11/2016 Document Reviewed: 08/25/2015 Elsevier Interactive Patient Education  2018 [employer]. Otitis Media, Adult Otitis media is redness, soreness, and puffiness (swelling) in the space just behind your eardrum (middle ear). It may be caused by allergies or infection. It often happens along with a cold. Follow these instructions at home:  Take your medicine as told. Finish it even if you start to feel better.  Only take over-the-counter or prescription medicines for pain, discomfort, or fever as told by your doctor.  Follow up with your doctor as told. Contact a doctor if:  You have otitis media only in one ear, or bleeding from your nose, or both.  You notice a lump on your neck.  You are not getting better in 3-5 days.  You feel worse instead of better. Get help right away if:  You have pain that is not helped with medicine.  You have puffiness, redness, or pain around your ear.  You get a stiff neck.  You cannot move part of your face (paralysis).  You notice that the bone behind your ear hurts when you touch it. This information is not intended to replace advice given to you by your health care provider. Make sure you discuss any questions  you have with your health care provider. Document Released: 04/22/2008 Document Revised: 04/11/2016 Document Reviewed: 06/01/2013 Elsevier Interactive Patient Education  2017 [employer].

## 2017-11-03 NOTE — Telephone Encounter (Signed)
Thank you :)

## 2017-12-01 ENCOUNTER — Other Ambulatory Visit: Payer: Self-pay | Admitting: Medical

## 2017-12-01 ENCOUNTER — Telehealth: Payer: Self-pay | Admitting: Medical

## 2017-12-01 DIAGNOSIS — I10 Essential (primary) hypertension: Secondary | ICD-10-CM

## 2017-12-01 MED ORDER — AMLODIPINE BESYLATE 10 MG PO TABS: 10.0000 mg | ORAL_TABLET | Freq: Every day | ORAL | 0 refills | Status: DC

## 2017-12-01 NOTE — Telephone Encounter (Signed)
Refill for Amlodipine 10mg  90d supply given to H. Ratcliff PA-C. Pt notified refill was done.

## 2017-12-03 ENCOUNTER — Ambulatory Visit: Payer: Self-pay | Admitting: Adult Health

## 2017-12-26 ENCOUNTER — Other Ambulatory Visit: Payer: Self-pay | Admitting: Adult Health

## 2017-12-26 DIAGNOSIS — I10 Essential (primary) hypertension: Secondary | ICD-10-CM

## 2017-12-26 MED ORDER — RAMIPRIL 5 MG PO CAPS: 5.0000 mg | ORAL_CAPSULE | Freq: Every day | ORAL | 0 refills | Status: DC

## 2017-12-26 NOTE — Progress Notes (Signed)
Meds ordered this encounter  Medications  . ramipril (ALTACE) 5 MG capsule    Sig: Take 1 capsule (5 mg total) by mouth daily. Please call office to schedule labs now and blood pressure recheck in after    Dispense:  30 capsule    Refill:  0    30 day refill/ Patient is due for labs and  will need blood pressure recheck appointment and evaluation of blood pressure.  Nelda Severe RN called patient to inform and patient verbalized understanding and denies any symptoms currently.   [place] [employer] secretary will call patient on Monday to schedule labs and appointment. He needs labs now and appointment in March.  Heather PA has already placed labs as future order see her last encounter if questions.  Last two office visits blood pressures have been normal.

## 2018-01-06 NOTE — Progress Notes (Signed)
[place] is coming in on Friday for lab draw and appointment to see Sharyn Lull

## 2018-01-09 ENCOUNTER — Other Ambulatory Visit: Payer: Self-pay

## 2018-01-09 ENCOUNTER — Encounter: Payer: Self-pay | Admitting: Adult Health

## 2018-01-09 ENCOUNTER — Ambulatory Visit: Payer: Self-pay | Admitting: Adult Health

## 2018-01-09 VITALS — BP 138/77 | HR 73 | Temp 97.6°F | Resp 16 | Ht 71.0 in | Wt 230.0 lb

## 2018-01-09 DIAGNOSIS — Z9114 Patient's other noncompliance with medication regimen: Secondary | ICD-10-CM

## 2018-01-09 DIAGNOSIS — E559 Vitamin D deficiency, unspecified: Secondary | ICD-10-CM

## 2018-01-09 DIAGNOSIS — E139 Other specified diabetes mellitus without complications: Secondary | ICD-10-CM

## 2018-01-09 DIAGNOSIS — E782 Mixed hyperlipidemia: Secondary | ICD-10-CM

## 2018-01-09 DIAGNOSIS — I10 Essential (primary) hypertension: Secondary | ICD-10-CM

## 2018-01-09 DIAGNOSIS — Z91199 Patient's noncompliance with other medical treatment and regimen due to unspecified reason: Secondary | ICD-10-CM | POA: Insufficient documentation

## 2018-01-09 DIAGNOSIS — H6123 Impacted cerumen, bilateral: Secondary | ICD-10-CM

## 2018-01-09 DIAGNOSIS — Z0000 Encounter for general adult medical examination without abnormal findings: Secondary | ICD-10-CM

## 2018-01-09 NOTE — Progress Notes (Addendum)
Subjective:     Patient ID: Cory Houston, male   DOB: [birth year], 62 y.o.   MRN: [id number]  HPI  Patient is a 62 year old male in no acute distress who returns to the clinic for a blood pressure recheck and wants to have ears rechecked if still need irrigation. He reports his right ear feels clogged at times. Denies any pain or drainage.   He reports his ears are feeling much better since he was treated with Antibiotics. He reports he missed his last appointment to to traveling to [place].  Patient  denies any fever, chills, rash, chest pain, shortness of breath, nausea, vomiting, or diarrhea.   He also reports his Hemoglobin A 1C  may not be great as he did not do with the holidays and has not been walking 10-12 miles a day and now only 2-3 miles in winter with work. He reports he will pick back up his walking when the weather starts to get dry and warmer. He will have A1C checked at [facility] .  He also reports he  Did not go for his  Sleep Study as his machine started working again- he has been and was advised that he should be tested again as it has been " years " and of the importance of the right air flow settings, mask fitting and new/ clean supplies/ and hygiene. He verbalizes understanding though declines at this time due to cost. He is aware and has been educated of risks versus benefits.  He denies any other symptoms or concerns at this time.   Patient Active Problem List   Diagnosis Date Noted  . Prediabetes 01/31/2017  . Hypertension 01/31/2017   Review of Systems  Constitutional: Positive for activity change (decreased walking with winter weather at work still walks 2 to 3 miles daily was walking 10-12 miles ) and appetite change (" ate a little worse for the holidays".). Negative for chills, diaphoresis, fatigue, fever and unexpected weight change.  HENT: Negative.   Respiratory: Negative.   Genitourinary: Negative.   Musculoskeletal: Negative.   Neurological:  Negative.   Hematological: Negative.   Psychiatric/Behavioral: Negative.        Objective:   Physical Exam  Constitutional: He is oriented to person, place, and time. He appears well-developed and well-nourished. No distress.  HENT:  Head: Normocephalic and atraumatic.  Right Ear: Hearing, tympanic membrane, external ear and ear canal normal. No drainage. No foreign bodies. Tympanic membrane is not injected, not perforated, not erythematous and not retracted. No middle ear effusion.  Left Ear: Hearing, tympanic membrane, external ear and ear canal normal. No drainage. No foreign bodies. Tympanic membrane is not injected, not perforated, not erythematous and not retracted.  No middle ear effusion.  Nose: Nose normal.  Mouth/Throat: Oropharynx is clear and moist. No oropharyngeal exudate.  Left ear partial cerumen impaction able to visualize tympanic membrane and is normal as documented.    Right ear full cerumen impaction denies any pain or any discharge. C/O muffled hearing right ear.   Patient aware  of risks and benefits of cerumen irrigation and would like to proceed with bilateral ear irrigation.   Nelda Severe RN irrigated bilateral ears without difficulty. Large amount of wax flushed out and provider was able to remove large amount of hard cerumen sitting at the outer entrance of canal. Patient reports he can now hear. He denies any pain  right tympanic membrane was then clearly visualized and normal as documented.  Eyes:  Conjunctivae and EOM are normal. Pupils are equal, round, and reactive to light. Right eye exhibits no discharge. Left eye exhibits no discharge. No scleral icterus.  Neck: Normal range of motion. Neck supple. No JVD present. No tracheal deviation present.  Cardiovascular: Normal rate, regular rhythm, normal heart sounds and intact distal pulses. Exam reveals no gallop and no friction rub.  No murmur heard. Pulmonary/Chest: Effort normal and breath sounds normal. No  stridor. No respiratory distress. He has no wheezes. He has no rales. He exhibits no tenderness.  Abdominal: Soft. Bowel sounds are normal.  Musculoskeletal: Normal range of motion.  Lymphadenopathy:    He has no cervical adenopathy.  Neurological: He is alert and oriented to person, place, and time. He displays normal reflexes. No cranial nerve deficit. He exhibits normal muscle tone. Coordination normal.  Skin: Skin is warm and dry. No rash noted. He is not diaphoretic. No erythema. No pallor.  Psychiatric: He has a normal mood and affect. His behavior is normal. Judgment and thought content normal.  Vitals reviewed.      Assessment:     Bilateral impacted cerumen - Plan: Ear Lavage, Ear Lavage  Diabetes mellitus of other type without complication, unspecified whether long term insulin use (HCC) - Plan: HgB A1c  Noncompliance with CPAP treatment      Plan:      Will wait for labs to return and call if abnormal.  CMP /lipid panel was completed / drawn today.  He is aware and in agreement to schedule and come for labs and in May for complete physical as last was in April 2018.  He is aware of providers desire for him to have a sleep study but he declines.  He requests 90 days of Ramipril when labs return. Would like 90 days as cheaper. He reports he has all other medications   Advised to return to clinic for an appointment if no improvement within 72 hours or if any symptoms change or worsen. Advised ER or urgent Care if after hours or on weekend. 911 for emergency symptoms at any time.    Patient verbalized understanding of all instructions given and denies any further questions at this time.     Lab appointment scheduled for May 8th and he will schedule physical at that time he says.  I have already ordered this as a future order in the computer. Orders Placed This Encounter  Procedures  . CMP12+LP+TP+TSH+6AC+PSA+CBC.    Standing Status:   Future    Standing Expiration  Date:   05/09/2018  . HgB A1c    Patient should be fasting    Standing Status:   Future    Standing Expiration Date:   05/09/2018  .  Marland Kitchen

## 2018-01-09 NOTE — Patient Instructions (Addendum)
Your yearly physical is due with [name], PA-C after April - you can do labs in May with physical appointment May 2019.    Earwax Buildup, Adult The ears produce a substance called earwax that helps keep bacteria out of the ear and protects the skin in the ear canal. Occasionally, earwax can build up in the ear and cause discomfort or hearing loss. What increases the risk? This condition is more likely to develop in people who:  Are male.  Are elderly.  Naturally produce more earwax.  Clean their ears often with cotton swabs.  Use earplugs often.  Use in-ear headphones often.  Wear hearing aids.  Have narrow ear canals.  Have earwax that is overly thick or sticky.  Have eczema.  Are dehydrated.  Have excess hair in the ear canal.  What are the signs or symptoms? Symptoms of this condition include:  Reduced or muffled hearing.  A feeling of fullness in the ear or feeling that the ear is plugged.  Fluid coming from the ear.  Ear pain.  Ear itch.  Ringing in the ear.  Coughing.  An obvious piece of earwax that can be seen inside the ear canal.  How is this diagnosed? This condition may be diagnosed based on:  Your symptoms.  Your medical history.  An ear exam. During the exam, your health care provider will look into your ear with an instrument called an otoscope.  You may have tests, including a hearing test. How is this treated? This condition may be treated by:  Using ear drops to soften the earwax.  Having the earwax removed by a health care provider. The health care provider may: ? Flush the ear with water. ? Use an instrument that has a loop on the end (curette). ? Use a suction device.  Surgery to remove the wax buildup. This may be done in severe cases.  Follow these instructions at home:  Take over-the-counter and prescription medicines only as told by your health care provider.  Do not put any objects, including cotton  swabs, into your ear. You can clean the opening of your ear canal with a washcloth or facial tissue.  Follow instructions from your health care provider about cleaning your ears. Do not over-clean your ears.  Drink enough fluid to keep your urine clear or pale yellow. This will help to thin the earwax.  Keep all follow-up visits as told by your health care provider. If earwax builds up in your ears often or if you use hearing aids, consider seeing your health care provider for routine, preventive ear cleanings. Ask your health care provider how often you should schedule your cleanings.  If you have hearing aids, clean them according to instructions from the manufacturer and your health care provider. Contact a health care provider if:  You have ear pain.  You develop a fever.  You have blood, pus, or other fluid coming from your ear.  You have hearing loss.  You have ringing in your ears that does not go away.  Your symptoms do not improve with treatment.  You feel like the room is spinning (vertigo). Summary  Earwax can build up in the ear and cause discomfort or hearing loss.  The most common symptoms of this condition include reduced or muffled hearing and a feeling of fullness in the ear or feeling that the ear is plugged.  This condition may be diagnosed based on your symptoms, your medical history, and an ear exam.  This condition may be treated by using ear drops to soften the earwax or by having the earwax removed by a health care provider.  Do not put any objects, including cotton swabs, into your ear. You can clean the opening of your ear canal with a washcloth or facial tissue. This information is not intended to replace advice given to you by your health care provider. Make sure you discuss any questions you have with your health care provider. Document Released: 12/12/2004 Document Revised: 01/15/2017 Document Reviewed: 01/15/2017 Elsevier Interactive Patient  Education  2018 [employer]. Hypertension Hypertension is another name for high blood pressure. High blood pressure forces your heart to work harder to pump blood. This can cause problems over time. There are two numbers in a blood pressure reading. There is a top number (systolic) over a bottom number (diastolic). It is best to have a blood pressure below 120/80. Healthy choices can help lower your blood pressure. You may need medicine to help lower your blood pressure if:  Your blood pressure cannot be lowered with healthy choices.  Your blood pressure is higher than 130/80.  Follow these instructions at home: Eating and drinking  If directed, follow the DASH eating plan. This diet includes: ? Filling half of your plate at each meal with fruits and vegetables. ? Filling one quarter of your plate at each meal with whole grains. Whole grains include whole wheat pasta, brown rice, and whole grain bread. ? Eating or drinking low-fat dairy products, such as skim milk or low-fat yogurt. ? Filling one quarter of your plate at each meal with low-fat (lean) proteins. Low-fat proteins include fish, skinless chicken, eggs, beans, and tofu. ? Avoiding fatty meat, cured and processed meat, or chicken with skin. ? Avoiding premade or processed food.  Eat less than 1,500 mg of salt (sodium) a day.  Limit alcohol use to no more than 1 drink a day for nonpregnant women and 2 drinks a day for men. One drink equals 12 oz of beer, 5 oz of wine, or 1 oz of hard liquor. Lifestyle  Work with your doctor to stay at a healthy weight or to lose weight. Ask your doctor what the best weight is for you.  Get at least 30 minutes of exercise that causes your heart to beat faster (aerobic exercise) most days of the week. This may include walking, swimming, or biking.  Get at least 30 minutes of exercise that strengthens your muscles (resistance exercise) at least 3 days a week. This may include lifting weights or  pilates.  Do not use any products that contain nicotine or tobacco. This includes cigarettes and e-cigarettes. If you need help quitting, ask your doctor.  Check your blood pressure at home as told by your doctor.  Keep all follow-up visits as told by your doctor. This is important. Medicines  Take over-the-counter and prescription medicines only as told by your doctor. Follow directions carefully.  Do not skip doses of blood pressure medicine. The medicine does not work as well if you skip doses. Skipping doses also puts you at risk for problems.  Ask your doctor about side effects or reactions to medicines that you should watch for. Contact a doctor if:  You think you are having a reaction to the medicine you are taking.  You have headaches that keep coming back (recurring).  You feel dizzy.  You have swelling in your ankles.  You have trouble with your vision. Get help right away if:  You get a very bad headache.  You start to feel confused.  You feel weak or numb.  You feel faint.  You get very bad pain in your: ? Chest. ? Belly (abdomen).  You throw up (vomit) more than once.  You have trouble breathing. Summary  Hypertension is another name for high blood pressure.  Making healthy choices can help lower blood pressure. If your blood pressure cannot be controlled with healthy choices, you may need to take medicine. This information is not intended to replace advice given to you by your health care provider. Make sure you discuss any questions you have with your health care provider. Document Released: 04/22/2008 Document Revised: 10/02/2016 Document Reviewed: 10/02/2016 Elsevier Interactive Patient Education  2018 [employer]. Diabetes Mellitus and Nutrition When you have diabetes (diabetes mellitus), it is very important to have healthy eating habits because your blood sugar (glucose) levels are greatly affected by what you eat and drink. Eating healthy  foods in the appropriate amounts, at about the same times every day, can help you:  Control your blood glucose.  Lower your risk of heart disease.  Improve your blood pressure.  Reach or maintain a healthy weight.  Every person with diabetes is different, and each person has different needs for a meal plan. Your health care provider may recommend that you work with a diet and nutrition specialist (dietitian) to make a meal plan that is best for you. Your meal plan may vary depending on factors such as:  The calories you need.  The medicines you take.  Your weight.  Your blood glucose, blood pressure, and cholesterol levels.  Your activity level.  Other health conditions you have, such as heart or kidney disease.  How do carbohydrates affect me? Carbohydrates affect your blood glucose level more than any other type of food. Eating carbohydrates naturally increases the amount of glucose in your blood. Carbohydrate counting is a method for keeping track of how many carbohydrates you eat. Counting carbohydrates is important to keep your blood glucose at a healthy level, especially if you use insulin or take certain oral diabetes medicines. It is important to know how many carbohydrates you can safely have in each meal. This is different for every person. Your dietitian can help you calculate how many carbohydrates you should have at each meal and for snack. Foods that contain carbohydrates include:  Bread, cereal, rice, pasta, and crackers.  Potatoes and corn.  Peas, beans, and lentils.  Milk and yogurt.  Fruit and juice.  Desserts, such as cakes, cookies, ice cream, and candy.  How does alcohol affect me? Alcohol can cause a sudden decrease in blood glucose (hypoglycemia), especially if you use insulin or take certain oral diabetes medicines. Hypoglycemia can be a life-threatening condition. Symptoms of hypoglycemia (sleepiness, dizziness, and confusion) are similar to symptoms  of having too much alcohol. If your health care provider says that alcohol is safe for you, follow these guidelines:  Limit alcohol intake to no more than 1 drink per day for nonpregnant women and 2 drinks per day for men. One drink equals 12 oz of beer, 5 oz of wine, or 1 oz of hard liquor.  Do not drink on an empty stomach.  Keep yourself hydrated with water, diet soda, or unsweetened iced tea.  Keep in mind that regular soda, juice, and other mixers may contain a lot of sugar and must be counted as carbohydrates.  What are tips for following this plan? Reading food labels  Start by checking the serving size on the label. The amount of calories, carbohydrates, fats, and other nutrients listed on the label are based on one serving of the food. Many foods contain more than one serving per package.  Check the total grams (g) of carbohydrates in one serving. You can calculate the number of servings of carbohydrates in one serving by dividing the total carbohydrates by 15. For example, if a food has 30 g of total carbohydrates, it would be equal to 2 servings of carbohydrates.  Check the number of grams (g) of saturated and trans fats in one serving. Choose foods that have low or no amount of these fats.  Check the number of milligrams (mg) of sodium in one serving. Most people should limit total sodium intake to less than 2,300 mg per day.  Always check the nutrition information of foods labeled as "low-fat" or "nonfat". These foods may be higher in added sugar or refined carbohydrates and should be avoided.  Talk to your dietitian to identify your daily goals for nutrients listed on the label. Shopping  Avoid buying canned, premade, or processed foods. These foods tend to be high in fat, sodium, and added sugar.  Shop around the outside edge of the grocery store. This includes fresh fruits and vegetables, bulk grains, fresh meats, and fresh dairy. Cooking  Use low-heat cooking  methods, such as baking, instead of high-heat cooking methods like deep frying.  Cook using healthy oils, such as olive, canola, or sunflower oil.  Avoid cooking with butter, cream, or high-fat meats. Meal planning  Eat meals and snacks regularly, preferably at the same times every day. Avoid going long periods of time without eating.  Eat foods high in fiber, such as fresh fruits, vegetables, beans, and whole grains. Talk to your dietitian about how many servings of carbohydrates you can eat at each meal.  Eat 4-6 ounces of lean protein each day, such as lean meat, chicken, fish, eggs, or tofu. 1 ounce is equal to 1 ounce of meat, chicken, or fish, 1 egg, or 1/4 cup of tofu.  Eat some foods each day that contain healthy fats, such as avocado, nuts, seeds, and fish. Lifestyle   Check your blood glucose regularly.  Exercise at least 30 minutes 5 or more days each week, or as told by your health care provider.  Take medicines as told by your health care provider.  Do not use any products that contain nicotine or tobacco, such as cigarettes and e-cigarettes. If you need help quitting, ask your health care provider.  Work with a Social worker or diabetes educator to identify strategies to manage stress and any emotional and social challenges. What are some questions to ask my health care provider?  Do I need to meet with a diabetes educator?  Do I need to meet with a dietitian?  What number can I call if I have questions?  When are the best times to check my blood glucose? Where to find more information:  American Diabetes Association: diabetes.org/food-and-fitness/food  Academy of Nutrition and Dietetics: PokerClues.dk  [employer] of Diabetes and Digestive and Kidney Diseases (NIH): ContactWire.be Summary  A healthy meal plan will help you control  your blood glucose and maintain a healthy lifestyle.  Working with a diet and nutrition specialist (dietitian) can help you make a meal plan that is best for you.  Keep in mind that carbohydrates and alcohol have immediate effects on your blood glucose levels. It is important to count  carbohydrates and to use alcohol carefully. This information is not intended to replace advice given to you by your health care provider. Make sure you discuss any questions you have with your health care provider. Document Released: 08/01/2005 Document Revised: 12/09/2016 Document Reviewed: 12/09/2016 Elsevier Interactive Patient Education  [employer].

## 2018-01-09 NOTE — Addendum Note (Signed)
Addended by: Doreen Beam on: 01/09/2018 10:25 AM   Modules accepted: Orders

## 2018-01-09 NOTE — Progress Notes (Deleted)
   Allergies  Allergen Reactions  . Codeine Anaphylaxis    Patient Active Problem List   Diagnosis Date Noted  . Prediabetes 01/31/2017  . Hypertension 01/31/2017

## 2018-01-10 LAB — COMPREHENSIVE METABOLIC PANEL
ALBUMIN: 4.7 g/dL (ref 3.5–5.5)
ALK PHOS: 136 IU/L — AB (ref 39–117)
ALT: 47 IU/L — ABNORMAL HIGH (ref 0–44)
AST: 31 IU/L (ref 0–40)
Albumin/Globulin Ratio: 1.9 (ref 1.2–2.2)
BILIRUBIN TOTAL: 0.8 mg/dL (ref 0.0–1.2)
BUN / CREAT RATIO: 13 (ref 9–20)
BUN: 13 mg/dL (ref 6–24)
CO2: 20 mmol/L (ref 20–29)
Calcium: 9.3 mg/dL (ref 8.7–10.2)
Chloride: 102 mmol/L (ref 96–106)
Creatinine, Ser: 0.99 mg/dL (ref 0.76–1.27)
GFR calc Af Amer: 96 mL/min/{1.73_m2} (ref >59)
GFR calc non Af Amer: 83 mL/min/{1.73_m2} (ref >59)
GLUCOSE: 177 mg/dL — AB (ref 65–99)
Globulin, Total: 2.5 g/dL (ref 1.5–4.5)
Potassium: 4.6 mmol/L (ref 3.5–5.2)
Sodium: 136 mmol/L (ref 134–144)
Total Protein: 7.2 g/dL (ref 6.0–8.5)

## 2018-01-10 LAB — LIPID PANEL
CHOL/HDL RATIO: 7.1 ratio — AB (ref 0.0–5.0)
Cholesterol, Total: 200 mg/dL — ABNORMAL HIGH (ref 100–199)
HDL: 28 mg/dL — AB (ref >39)
LDL Calculated: 139 mg/dL — ABNORMAL HIGH (ref 0–99)
Triglycerides: 165 mg/dL — ABNORMAL HIGH (ref 0–149)
VLDL CHOLESTEROL CAL: 33 mg/dL (ref 5–40)

## 2018-01-10 LAB — VITAMIN D 25 HYDROXY (VIT D DEFICIENCY, FRACTURES): VIT D 25 HYDROXY: 42.8 ng/mL (ref 30.0–100.0)

## 2018-01-16 ENCOUNTER — Telehealth: Payer: Self-pay | Admitting: Adult Health

## 2018-01-16 ENCOUNTER — Encounter: Payer: Self-pay | Admitting: Adult Health

## 2018-01-16 DIAGNOSIS — I10 Essential (primary) hypertension: Secondary | ICD-10-CM

## 2018-01-16 MED ORDER — AMLODIPINE BESYLATE 10 MG PO TABS: 10.0000 mg | ORAL_TABLET | Freq: Every day | ORAL | 0 refills | Status: DC

## 2018-01-16 NOTE — Telephone Encounter (Addendum)
7:55 am patient called by provider he was unable to talk at the moment and reports he will return call within 2 minutes.   Called to discuss labs below, patient returned call at 8: 01 am, provider discussed labs below. LDL elevated from last check at 139, Total cholesterol increased 200, HDL remains low at 28. Alk Phos elevated 136, ALT 47 increased.   Vitamin D is improved he will remain on Vitamin D3  At 2,000 IU daily.  He reports he is taking medications as below:   Current Outpatient Medications:  .  amLODipine (NORVASC) 10 MG tablet, Take 1 tablet (10 mg total) by mouth daily., Disp: 90 tablet, Rfl: 0 .  Cholecalciferol (VITAMIN D-3) 5000 units TABS, Take 1 tablet by mouth daily., Disp: , Rfl:  .  fluticasone (FLONASE) 50 MCG/ACT nasal spray, Place 2 sprays into both nostrils daily., Disp: 16 g, Rfl: 6 .  loratadine (CLARITIN) 10 MG tablet, Take 1 tablet (10 mg total) by mouth daily., Disp: 30 tablet, Rfl: 11 .  metFORMIN (GLUCOPHAGE-XR) 500 MG 24 hr tablet, Take 1 tablet (500 mg total) by mouth 2 (two) times daily., Disp: 720 tablet, Rfl: 0 .  Multiple Vitamin (MULTIVITAMIN) tablet, Take 1 tablet by mouth daily., Disp: , Rfl:  .  Omega-3 Fatty Acids (FISH OIL) 1200 MG CAPS, Take 2,400 mg by mouth daily after breakfast. 1200 mg in the evening after supper, Disp: , Rfl:  .  omeprazole (PRILOSEC) 20 MG capsule, Take 20 mg by mouth daily., Disp: , Rfl:  .  ramipril (ALTACE) 5 MG capsule, Take 1 capsule (5 mg total) by mouth daily. Please call office to schedule labs now and blood pressure recheck in after, Disp: 30 capsule, Rfl: 0  He denies any tylenol containing products, reports rare alcohol use.  He is advised he may take 3,000 mg of Fish oil daily, he declines statin therapy at this time.   He also declines increasing dosage of Metformin at this time and reports he is getting back on his diet and exercise program starting now. He prefers to try this and have his labs rechecked in May to  see how he has done. He declines any other medical management at this time.  Provider discussed thoroughly diet and exercise guidelines.   He is advised to keep a diary of diet/ exercise and a log of his blood sugar reading checked daily and blood pressure readings.  He is to call the office for an appointment if any new concerns or medical problems arise. He is advised to seek urgent/ emergent medical care in Emergency room or Urgent care if after hours at anytime.   Patient verbalized understanding of all instructions given and denies any further questions at this time.   Will refill 90 days of amlodipine as patient reports this is the only one he needs refill on at this time.   Meds ordered this encounter  Medications  . amLODipine (NORVASC) 10 MG tablet    Sig: Take 1 tablet (10 mg total) by mouth daily.    Dispense:  90 tablet    Refill:  0    Recent Results (from the past 2160 hour(s))  VITAMIN D 25 Hydroxy (Vit-D Deficiency, Fractures)     Status: None   Collection Time: 01/09/18  8:30 AM  Result Value Ref Range   Vit D, 25-Hydroxy 42.8 30.0 - 100.0 ng/mL    Comment: Vitamin D deficiency has been defined by the Institute of Medicine and an  Endocrine Society practice guideline as a level of serum 25-OH vitamin D less than 20 ng/mL (1,2). The Endocrine Society went on to further define vitamin D insufficiency as a level between 21 and 29 ng/mL (2). 1. IOM (Institute of Medicine). 2010. Dietary reference    intakes for calcium and D. [place]: The    [employer]. 2. Holick MF, Binkley Woodworth, Bischoff-Ferrari HA, et al.    Evaluation, treatment, and prevention of vitamin D    deficiency: an Endocrine Society clinical practice    guideline. JCEM. 2011 Jul; 96(7):1911-30.   Lipid panel     Status: Abnormal   Collection Time: 01/09/18  8:30 AM  Result Value Ref Range   Cholesterol, Total 200 (H) 100 - 199 mg/dL   Triglycerides 165 (H) 0 - 149 mg/dL   HDL 28 (L)  >39 mg/dL   VLDL Cholesterol Cal 33 5 - 40 mg/dL   LDL Calculated 139 (H) 0 - 99 mg/dL   Chol/HDL Ratio 7.1 (H) 0.0 - 5.0 ratio    Comment:                                   T. Chol/HDL Ratio                                             Men  Women                               1/2 Avg.Risk  3.4    3.3                                   Avg.Risk  5.0    4.4                                2X Avg.Risk  9.6    7.1                                3X Avg.Risk 23.4   11.0   Comprehensive metabolic panel     Status: Abnormal   Collection Time: 01/09/18  8:30 AM  Result Value Ref Range   Glucose 177 (H) 65 - 99 mg/dL   BUN 13 6 - 24 mg/dL   Creatinine, Ser 0.99 0.76 - 1.27 mg/dL   GFR calc non Af Amer 83 >59 mL/min/1.73   GFR calc Af Amer 96 >59 mL/min/1.73   BUN/Creatinine Ratio 13 9 - 20   Sodium 136 134 - 144 mmol/L   Potassium 4.6 3.5 - 5.2 mmol/L   Chloride 102 96 - 106 mmol/L   CO2 20 20 - 29 mmol/L   Calcium 9.3 8.7 - 10.2 mg/dL   Total Protein 7.2 6.0 - 8.5 g/dL   Albumin 4.7 3.5 - 5.5 g/dL   Globulin, Total 2.5 1.5 - 4.5 g/dL   Albumin/Globulin Ratio 1.9 1.2 - 2.2   Bilirubin Total 0.8 0.0 - 1.2 mg/dL   Alkaline Phosphatase 136 (H) 39 - 117 IU/L   AST 31 0 - 40 IU/L  ALT 47 (H) 0 - 44 IU/L

## 2018-03-19 ENCOUNTER — Other Ambulatory Visit: Payer: Self-pay | Admitting: Adult Health

## 2018-03-19 DIAGNOSIS — I10 Essential (primary) hypertension: Secondary | ICD-10-CM

## 2018-03-19 MED ORDER — RAMIPRIL 5 MG PO CAPS: 5.0000 mg | ORAL_CAPSULE | Freq: Every day | ORAL | 0 refills | Status: DC

## 2018-03-19 MED ORDER — AMLODIPINE BESYLATE 10 MG PO TABS: 10.0000 mg | ORAL_TABLET | Freq: Every day | ORAL | 0 refills | Status: DC

## 2018-03-19 NOTE — Progress Notes (Signed)
Meds ordered this encounter  Medications  . amLODipine (NORVASC) 10 MG tablet    Sig: Take 1 tablet (10 mg total) by mouth daily.    Dispense:  90 tablet    Refill:  0  . ramipril (ALTACE) 5 MG capsule    Sig: Take 1 capsule (5 mg total) by mouth daily. Please call office to schedule labs now and blood pressure recheck in after    Dispense:  90 capsule    Refill:  0    Refilled blood pressure medications to [address] [facility] per patient request. He spoke with Nelda Severe RN 03/19/18 and reported he is doing well- denies any complaints. He has follow up lab appointment May 2019.  He will call the office if any concerns.

## 2018-03-25 ENCOUNTER — Other Ambulatory Visit: Payer: Self-pay

## 2018-03-25 ENCOUNTER — Ambulatory Visit: Payer: Self-pay | Admitting: Adult Health

## 2018-03-25 DIAGNOSIS — E139 Other specified diabetes mellitus without complications: Secondary | ICD-10-CM

## 2018-03-25 DIAGNOSIS — Y990 Civilian activity done for income or pay: Secondary | ICD-10-CM

## 2018-03-25 DIAGNOSIS — Z0000 Encounter for general adult medical examination without abnormal findings: Secondary | ICD-10-CM

## 2018-03-25 NOTE — Progress Notes (Signed)
Documented on paper chart per Theodis Sato RN  Supervision. See [facility] for paper charting [place].

## 2018-03-26 LAB — CMP12+LP+TP+TSH+6AC+PSA+CBC…
ALT: 34 IU/L (ref 0–44)
AST: 24 IU/L (ref 0–40)
Albumin/Globulin Ratio: 1.6 (ref 1.2–2.2)
Albumin: 4.4 g/dL (ref 3.5–5.5)
Alkaline Phosphatase: 136 IU/L — ABNORMAL HIGH (ref 39–117)
BASOS: 0 %
BUN / CREAT RATIO: 16 (ref 9–20)
BUN: 18 mg/dL (ref 6–24)
Basophils Absolute: 0 10*3/uL (ref 0.0–0.2)
Bilirubin Total: 0.5 mg/dL (ref 0.0–1.2)
CALCIUM: 9 mg/dL (ref 8.7–10.2)
CHLORIDE: 103 mmol/L (ref 96–106)
CHOL/HDL RATIO: 6.9 ratio — AB (ref 0.0–5.0)
CREATININE: 1.12 mg/dL (ref 0.76–1.27)
Cholesterol, Total: 187 mg/dL (ref 100–199)
EOS (ABSOLUTE): 0.1 10*3/uL (ref 0.0–0.4)
EOS: 1 %
Estimated CHD Risk: 1.5 times avg. — ABNORMAL HIGH (ref 0.0–1.0)
Free Thyroxine Index: 2.1 (ref 1.2–4.9)
GFR calc Af Amer: 83 mL/min/{1.73_m2} (ref >59)
GFR, EST NON AFRICAN AMERICAN: 72 mL/min/{1.73_m2} (ref >59)
GGT: 23 IU/L (ref 0–65)
GLUCOSE: 148 mg/dL — AB (ref 65–99)
Globulin, Total: 2.7 g/dL (ref 1.5–4.5)
HDL: 27 mg/dL — ABNORMAL LOW (ref >39)
HEMATOCRIT: 44.3 % (ref 37.5–51.0)
HEMOGLOBIN: 15.1 g/dL (ref 13.0–17.7)
IMMATURE GRANS (ABS): 0 10*3/uL (ref 0.0–0.1)
IMMATURE GRANULOCYTES: 0 %
Iron: 71 ug/dL (ref 38–169)
LDH: 218 IU/L (ref 121–224)
LDL CALC: 126 mg/dL — AB (ref 0–99)
LYMPHS: 22 %
Lymphocytes Absolute: 1.6 10*3/uL (ref 0.7–3.1)
MCH: 29.4 pg (ref 26.6–33.0)
MCHC: 34.1 g/dL (ref 31.5–35.7)
MCV: 86 fL (ref 79–97)
Monocytes Absolute: 0.7 10*3/uL (ref 0.1–0.9)
Monocytes: 9 %
Neutrophils Absolute: 4.9 10*3/uL (ref 1.4–7.0)
Neutrophils: 68 %
PHOSPHORUS: 2.8 mg/dL (ref 2.5–4.5)
POTASSIUM: 4.1 mmol/L (ref 3.5–5.2)
Platelets: 225 10*3/uL (ref 150–379)
Prostate Specific Ag, Serum: 0.2 ng/mL (ref 0.0–4.0)
RBC: 5.14 x10E6/uL (ref 4.14–5.80)
RDW: 14.6 % (ref 12.3–15.4)
SODIUM: 138 mmol/L (ref 134–144)
T3 Uptake Ratio: 29 % (ref 24–39)
T4 TOTAL: 7.2 ug/dL (ref 4.5–12.0)
TSH: 2.29 u[IU]/mL (ref 0.450–4.500)
Total Protein: 7.1 g/dL (ref 6.0–8.5)
Triglycerides: 170 mg/dL — ABNORMAL HIGH (ref 0–149)
URIC ACID: 7.2 mg/dL (ref 3.7–8.6)
VLDL Cholesterol Cal: 34 mg/dL (ref 5–40)
WBC: 7.3 10*3/uL (ref 3.4–10.8)

## 2018-03-26 LAB — HEMOGLOBIN A1C
ESTIMATED AVERAGE GLUCOSE: 154 mg/dL
Hgb A1c MFr Bld: 7 % — ABNORMAL HIGH (ref 4.8–5.6)

## 2018-03-27 ENCOUNTER — Telehealth: Payer: Self-pay | Admitting: Adult Health

## 2018-03-27 NOTE — Telephone Encounter (Signed)
Duplicate. No other contact made.

## 2018-03-27 NOTE — Telephone Encounter (Signed)
Called patient regarding labs received.  He is taking Metformin(Glucophage XR) BID per his reports. Hemoglobin A1C is elevated to 7.0 from previous. He reports his diet has not been as good and he is going to try harder with diet.   Total cholesterol improved, Triglycerides increased slightly. He reports he has been eating increased bacon. He declines statin therapy at this time and reports that he will consider if his total cholesterol increases to 200 again. Discussed guidelines and recommendations and risks versus benefits. Lifestyle modifications.   He will schedule appointment to review labs and for education on diet changes. May benefit from nutritional consult if he is in agreement.   Provider thoroughly discussed in collaboration above plan with supervising physician Dr. Miguel Aschoff who is in agreement with the care plan as above.   Belinda Day will schedule an hour appointment for B/P check, exam and education with [name], PA-C  Patient verbalized understanding of all instructions given and denies any further questions at this time.

## 2018-04-14 ENCOUNTER — Ambulatory Visit: Payer: Self-pay | Admitting: Medical

## 2018-04-14 ENCOUNTER — Encounter: Payer: Self-pay | Admitting: Medical

## 2018-04-14 VITALS — BP 138/75 | HR 72 | Temp 98.4°F | Resp 16 | Wt 232.8 lb

## 2018-04-14 DIAGNOSIS — E669 Obesity, unspecified: Secondary | ICD-10-CM

## 2018-04-14 DIAGNOSIS — I10 Essential (primary) hypertension: Secondary | ICD-10-CM

## 2018-04-14 DIAGNOSIS — Z79899 Other long term (current) drug therapy: Secondary | ICD-10-CM

## 2018-04-14 DIAGNOSIS — Z0000 Encounter for general adult medical examination without abnormal findings: Secondary | ICD-10-CM

## 2018-04-14 DIAGNOSIS — Z6832 Body mass index (BMI) 32.0-32.9, adult: Secondary | ICD-10-CM

## 2018-04-14 DIAGNOSIS — K219 Gastro-esophageal reflux disease without esophagitis: Secondary | ICD-10-CM

## 2018-04-14 DIAGNOSIS — E119 Type 2 diabetes mellitus without complications: Secondary | ICD-10-CM

## 2018-04-14 DIAGNOSIS — J301 Allergic rhinitis due to pollen: Secondary | ICD-10-CM

## 2018-04-14 DIAGNOSIS — Z889 Allergy status to unspecified drugs, medicaments and biological substances status: Secondary | ICD-10-CM

## 2018-04-14 DIAGNOSIS — Z760 Encounter for issue of repeat prescription: Secondary | ICD-10-CM

## 2018-04-14 LAB — POCT URINALYSIS DIPSTICK
Bilirubin, UA: NEGATIVE
Glucose, UA: NEGATIVE
Ketones, UA: NEGATIVE
Leukocytes, UA: NEGATIVE
Nitrite, UA: NEGATIVE
PH UA: 6 (ref 5.0–8.0)
Protein, UA: NEGATIVE
RBC UA: NEGATIVE
SPEC GRAV UA: 1.025 (ref 1.010–1.025)
UROBILINOGEN UA: NEGATIVE U/dL — AB

## 2018-04-14 MED ORDER — AMLODIPINE BESYLATE 10 MG PO TABS: 10.0000 mg | ORAL_TABLET | Freq: Every day | ORAL | 0 refills | Status: DC

## 2018-04-14 MED ORDER — FLUTICASONE PROPIONATE 50 MCG/ACT NA SUSP: 2.0000 | Freq: Every day | NASAL | 6 refills | Status: DC

## 2018-04-14 MED ORDER — RAMIPRIL 5 MG PO CAPS: 5.0000 mg | ORAL_CAPSULE | Freq: Every day | ORAL | 0 refills | Status: DC

## 2018-04-14 MED ORDER — OMEPRAZOLE 20 MG PO CPDR: 20.0000 mg | DELAYED_RELEASE_CAPSULE | Freq: Every day | ORAL | 0 refills | Status: DC

## 2018-04-14 MED ORDER — LORATADINE 10 MG PO TABS: 10.0000 mg | ORAL_TABLET | Freq: Every day | ORAL | 1 refills | Status: DC

## 2018-04-14 NOTE — Patient Instructions (Addendum)
GERD Diabetes Mellitus and Exercise Exercising regularly is important for your overall health, especially when you have diabetes (diabetes mellitus). Exercising is not only about losing weight. It has many health benefits, such as increasing muscle strength and bone density and reducing body fat and stress. This leads to improved fitness, flexibility, and endurance, all of which result in better overall health. Exercise has additional benefits for people with diabetes, including:  Reducing appetite.  Helping to lower and control blood glucose.  Lowering blood pressure.  Helping to control amounts of fatty substances (lipids) in the blood, such as cholesterol and triglycerides.  Helping the body to respond better to insulin (improving insulin sensitivity).  Reducing how much insulin the body needs.  Decreasing the risk for heart disease by: ? Lowering cholesterol and triglyceride levels. ? Increasing the levels of good cholesterol. ? Lowering blood glucose levels.  What is my activity plan? Your health care provider or certified diabetes educator can help you make a plan for the type and frequency of exercise (activity plan) that works for you. Make sure that you:  Do at least 150 minutes of moderate-intensity or vigorous-intensity exercise each week. This could be brisk walking, biking, or water aerobics. ? Do stretching and strength exercises, such as yoga or weightlifting, at least 2 times a week. ? Spread out your activity over at least 3 days of the week.  Get some form of physical activity every day. ? Do not go more than 2 days in a row without some kind of physical activity. ? Avoid being inactive for more than 90 minutes at a time. Take frequent breaks to walk or stretch.  Choose a type of exercise or activity that you enjoy, and set realistic goals.  Start slowly, and gradually increase the intensity of your exercise over time.  What do I need to know about managing my  diabetes?  Check your blood glucose before and after exercising. ? If your blood glucose is higher than 240 mg/dL (13.3 mmol/L) before you exercise, check your urine for ketones. If you have ketones in your urine, do not exercise until your blood glucose returns to normal.  Know the symptoms of low blood glucose (hypoglycemia) and how to treat it. Your risk for hypoglycemia increases during and after exercise. Common symptoms of hypoglycemia can include: ? Hunger. ? Anxiety. ? Sweating and feeling clammy. ? Confusion. ? Dizziness or feeling light-headed. ? Increased heart rate or palpitations. ? Blurry vision. ? Tingling or numbness around the mouth, lips, or tongue. ? Tremors or shakes. ? Irritability.  Keep a rapid-acting carbohydrate snack available before, during, and after exercise to help prevent or treat hypoglycemia.  Avoid injecting insulin into areas of the body that are going to be exercised. For example, avoid injecting insulin into: ? The arms, when playing tennis. ? The legs, when jogging.  Keep records of your exercise habits. Doing this can help you and your health care provider adjust your diabetes management plan as needed. Write down: ? Food that you eat before and after you exercise. ? Blood glucose levels before and after you exercise. ? The type and amount of exercise you have done. ? When your insulin is expected to peak, if you use insulin. Avoid exercising at times when your insulin is peaking.  When you start a new exercise or activity, work with your health care provider to make sure the activity is safe for you, and to adjust your insulin, medicines, or food intake as needed.  Drink plenty of water while you exercise to prevent dehydration or heat stroke. Drink enough fluid to keep your urine clear or pale yellow. This information is not intended to replace advice given to you by your health care provider. Make sure you discuss any questions you have with  your health care provider. Document Released: 01/25/2004 Document Revised: 05/24/2016 Document Reviewed: 04/15/2016 Elsevier Interactive Patient Education  2018 [employer].  Hypertension Hypertension is another name for high blood pressure. High blood pressure forces your heart to work harder to pump blood. This can cause problems over time. There are two numbers in a blood pressure reading. There is a top number (systolic) over a bottom number (diastolic). It is best to have a blood pressure below 120/80. Healthy choices can help lower your blood pressure. You may need medicine to help lower your blood pressure if:  Your blood pressure cannot be lowered with healthy choices.  Your blood pressure is higher than 130/80.  Follow these instructions at home: Eating and drinking  If directed, follow the DASH eating plan. This diet includes: ? Filling half of your plate at each meal with fruits and vegetables. ? Filling one quarter of your plate at each meal with whole grains. Whole grains include whole wheat pasta, brown rice, and whole grain bread. ? Eating or drinking low-fat dairy products, such as skim milk or low-fat yogurt. ? Filling one quarter of your plate at each meal with low-fat (lean) proteins. Low-fat proteins include fish, skinless chicken, eggs, beans, and tofu. ? Avoiding fatty meat, cured and processed meat, or chicken with skin. ? Avoiding premade or processed food.  Eat less than 1,500 mg of salt (sodium) a day.  Limit alcohol use to no more than 1 drink a day for nonpregnant women and 2 drinks a day for men. One drink equals 12 oz of beer, 5 oz of wine, or 1 oz of hard liquor. Lifestyle  Work with your doctor to stay at a healthy weight or to lose weight. Ask your doctor what the best weight is for you.  Get at least 30 minutes of exercise that causes your heart to beat faster (aerobic exercise) most days of the week. This may include walking, swimming, or  biking.  Get at least 30 minutes of exercise that strengthens your muscles (resistance exercise) at least 3 days a week. This may include lifting weights or pilates.  Do not use any products that contain nicotine or tobacco. This includes cigarettes and e-cigarettes. If you need help quitting, ask your doctor.  Check your blood pressure at home as told by your doctor.  Keep all follow-up visits as told by your doctor. This is important. Medicines  Take over-the-counter and prescription medicines only as told by your doctor. Follow directions carefully.  Do not skip doses of blood pressure medicine. The medicine does not work as well if you skip doses. Skipping doses also puts you at risk for problems.  Ask your doctor about side effects or reactions to medicines that you should watch for. Contact a doctor if:  You think you are having a reaction to the medicine you are taking.  You have headaches that keep coming back (recurring).  You feel dizzy.  You have swelling in your ankles.  You have trouble with your vision. Get help right away if:  You get a very bad headache.  You start to feel confused.  You feel weak or numb.  You feel faint.  You get very  bad pain in your: ? Chest. ? Belly (abdomen).  You throw up (vomit) more than once.  You have trouble breathing. Summary  Hypertension is another name for high blood pressure.  Making healthy choices can help lower blood pressure. If your blood pressure cannot be controlled with healthy choices, you may need to take medicine. This information is not intended to replace advice given to you by your health care provider. Make sure you discuss any questions you have with your health care provider. Document Released: 04/22/2008 Document Revised: 10/02/2016 Document Reviewed: 10/02/2016 Elsevier Interactive Patient Education  2018 [place] for Gastroesophageal Reflux Disease, Adult When you have  gastroesophageal reflux disease (GERD), the foods you eat and your eating habits are very important. Choosing the right foods can help ease your discomfort. What guidelines do I need to follow?  Choose fruits, vegetables, whole grains, and low-fat dairy products.  Choose low-fat meat, fish, and poultry.  Limit fats such as oils, salad dressings, butter, nuts, and avocado.  Keep a food diary. This helps you identify foods that cause symptoms.  Avoid foods that cause symptoms. These may be different for everyone.  Eat small meals often instead of 3 large meals a day.  Eat your meals slowly, in a place where you are relaxed.  Limit fried foods.  Cook foods using methods other than frying.  Avoid drinking alcohol.  Avoid drinking large amounts of liquids with your meals.  Avoid bending over or lying down until 2-3 hours after eating. What foods are not recommended? These are some foods and drinks that may make your symptoms worse: Vegetables Tomatoes. Tomato juice. Tomato and spaghetti sauce. Chili peppers. Onion and garlic. Horseradish. Fruits Oranges, grapefruit, and lemon (fruit and juice). Meats High-fat meats, fish, and poultry. This includes hot dogs, ribs, ham, sausage, salami, and bacon. Dairy Whole milk and chocolate milk. Sour cream. Cream. Butter. Ice cream. Cream cheese. Drinks Coffee and tea. Bubbly (carbonated) drinks or energy drinks. Condiments Hot sauce. Barbecue sauce. Sweets/Desserts Chocolate and cocoa. Donuts. Peppermint and spearmint. Fats and Oils High-fat foods. This includes [place] fries and potato chips. Other Vinegar. Strong spices. This includes black pepper, white pepper, red pepper, cayenne, curry powder, cloves, ginger, and chili powder. The items listed above may not be a complete list of foods and drinks to avoid. Contact your dietitian for more information. This information is not intended to replace advice given to you by your health care  provider. Make sure you discuss any questions you have with your health care provider. Document Released: 05/05/2012 Document Revised: 04/11/2016 Document Reviewed: 09/08/2013 Elsevier Interactive Patient Education  2017 [place].  High Cholesterol High cholesterol is a condition in which the blood has high levels of a white, waxy, fat-like substance (cholesterol). The human body needs small amounts of cholesterol. The liver makes all the cholesterol that the body needs. Extra (excess) cholesterol comes from the food that we eat. Cholesterol is carried from the liver by the blood through the blood vessels. If you have high cholesterol, deposits (plaques) may build up on the walls of your blood vessels (arteries). Plaques make the arteries narrower and stiffer. Cholesterol plaques increase your risk for heart attack and stroke. Work with your health care provider to keep your cholesterol levels in a healthy range. What increases the risk? This condition is more likely to develop in people who:  Eat foods that are high in animal fat (saturated fat) or cholesterol.  Are overweight.  Are  not getting enough exercise.  Have a family history of high cholesterol.  What are the signs or symptoms? There are no symptoms of this condition. How is this diagnosed? This condition may be diagnosed from the results of a blood test.  If you are older than age 52, your health care provider may check your cholesterol every 4-6 years.  You may be checked more often if you already have high cholesterol or other risk factors for heart disease.  The blood test for cholesterol measures:  "Bad" cholesterol (LDL cholesterol). This is the main type of cholesterol that causes heart disease. The desired level for LDL is less than 100.  "Good" cholesterol (HDL cholesterol). This type helps to protect against heart disease by cleaning the arteries and carrying the LDL away. The desired level for HDL is 60 or  higher.  Triglycerides. These are fats that the body can store or burn for energy. The desired number for triglycerides is lower than 150.  Total cholesterol. This is a measure of the total amount of cholesterol in your blood, including LDL cholesterol, HDL cholesterol, and triglycerides. A healthy number is less than 200.  How is this treated? This condition is treated with diet changes, lifestyle changes, and medicines. Diet changes  This may include eating more whole grains, fruits, vegetables, nuts, and fish.  This may also include cutting back on red meat and foods that have a lot of added sugar. Lifestyle changes  Changes may include getting at least 40 minutes of aerobic exercise 3 times a week. Aerobic exercises include walking, biking, and swimming. Aerobic exercise along with a healthy diet can help you maintain a healthy weight.  Changes may also include quitting smoking. Medicines  Medicines are usually given if diet and lifestyle changes have failed to reduce your cholesterol to healthy levels.  Your health care provider may prescribe a statin medicine. Statin medicines have been shown to reduce cholesterol, which can reduce the risk of heart disease. Follow these instructions at home: Eating and drinking  If told by your health care provider:  Eat chicken (without skin), fish, veal, shellfish, ground [place] breast, and round or loin cuts of red meat.  Do not eat fried foods or fatty meats, such as hot dogs and salami.  Eat plenty of fruits, such as apples.  Eat plenty of vegetables, such as broccoli, potatoes, and carrots.  Eat beans, peas, and lentils.  Eat grains such as barley, rice, couscous, and bulgur wheat.  Eat pasta without cream sauces.  Use skim or nonfat milk, and eat low-fat or nonfat yogurt and cheeses.  Do not eat or drink whole milk, cream, ice cream, egg yolks, or hard cheeses.  Do not eat stick margarine or tub margarines that contain  trans fats (also called partially hydrogenated oils).  Do not eat saturated tropical oils, such as coconut oil and palm oil.  Do not eat cakes, cookies, crackers, or other baked goods that contain trans fats.  General instructions  Exercise as directed by your health care provider. Increase your activity level with activities such as gardening, walking, and taking the stairs.  Take over-the-counter and prescription medicines only as told by your health care provider.  Do not use any products that contain nicotine or tobacco, such as cigarettes and e-cigarettes. If you need help quitting, ask your health care provider.  Keep all follow-up visits as told by your health care provider. This is important. Contact a health care provider if:  You are  struggling to maintain a healthy diet or weight.  You need help to start on an exercise program.  You need help to stop smoking. Get help right away if:  You have chest pain.  You have trouble breathing. This information is not intended to replace advice given to you by your health care provider. Make sure you discuss any questions you have with your health care provider. Document Released: 11/04/2005 Document Revised: 06/01/2016 Document Reviewed: 05/04/2016 Elsevier Interactive Patient Education  [employer].

## 2018-04-14 NOTE — Progress Notes (Signed)
Subjective:    Patient ID: Cory Houston, male    DOB: June 26, 1956, 62 y.o.   MRN: [id number]  HPI 62 yo male here for annual exam. Here for annual physical exam. Today walked 6.5 miles today since 6 am. 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     Review of Systems  Constitutional: Positive for activity change (walking 8-12 for work).  HENT: Positive for congestion (at night without his nasal spray.) and nosebleeds (right side but uses saline ns ,   relieves symptoms).   Eyes: Negative.   Respiratory: Negative.   Cardiovascular: Negative.   Gastrointestinal: Negative.   Endocrine: Negative.   Genitourinary: Negative.   Musculoskeletal: Negative.   Skin: Negative.   Allergic/Immunologic: Positive for environmental allergies. Negative for food allergies and immunocompromised state.  Neurological: Negative.   Hematological: Negative.   Psychiatric/Behavioral: Negative.    Using CPAP at night. Does not want to start a statin at this time.     Objective:   Physical Exam  Constitutional: He is oriented to person, place, and time. Vital signs are normal. He appears well-developed and well-nourished.  HENT:  Head: Normocephalic and atraumatic.  Right Ear: Hearing, external ear and ear canal normal. A middle ear effusion is present.  Left Ear: Hearing, external ear and ear canal normal. A middle ear effusion is present.  Mouth/Throat: Oropharynx is clear and moist.  Eyes: Pupils are equal, round, and reactive to light. Conjunctivae, EOM and lids are normal.  Neck: Trachea normal and normal range of motion. Neck supple.  Cardiovascular: Normal rate, regular rhythm, normal heart sounds and normal pulses.  Pulses:      Radial pulses are 2+ on the right side, and 2+ on the left side.       Femoral pulses are 2+ on the right side, and 2+ on the left side.      Popliteal pulses are 2+ on the right side, and 2+ on the left  side.       Dorsalis pedis pulses are 2+ on the right side, and 2+ on the left side.       Posterior tibial pulses are 2+ on the right side, and 2+ on the left side.  Pulmonary/Chest: Effort normal and breath sounds normal.  Abdominal: Soft. Normal appearance and bowel sounds are normal. There is no hepatosplenomegaly. A hernia (non tender, comes out when flexing body getting up from exam table reduces once sitting upward or lying back.) is present. Hernia confirmed positive in the ventral area.    Genitourinary: Rectum normal.  Musculoskeletal: Normal range of motion.       Right shoulder: Normal.       Left shoulder: Normal.       Right elbow: Normal.      Left elbow: Normal.       Right wrist: Normal.       Left wrist: Normal.       Right hip: Normal.       Left hip: Normal.       Right knee: Normal.       Left knee: Normal.       Right ankle: Normal.       Left ankle: Normal.       Cervical back: Normal.       Thoracic back: Normal.       Lumbar back: Normal.       Right upper  arm: Normal.       Left upper arm: Normal.       Right forearm: Normal.       Left forearm: Normal.       Right hand: Normal.       Left hand: Normal.       Right upper leg: Normal.       Left upper leg: Normal.       Right lower leg: Normal.       Left lower leg: Normal.       Right foot: Normal. There is normal range of motion and no deformity.       Left foot: Normal. There is normal range of motion and no deformity.  Feet:  Right Foot:  Skin Integrity: Positive for callus (great plantar surface).  Left Foot:  Skin Integrity: Positive for callus (great toe plantar surface).  Lymphadenopathy:    He has no cervical adenopathy.  Neurological: He is alert and oriented to person, place, and time.  Skin: Skin is warm and dry. Capillary refill takes less than 2 seconds.  Psychiatric: He has a normal mood and affect. His behavior is normal. Judgment and thought content normal.  Nursing note and  vitals reviewed.  blood and inflammed right sided nare   Defers genital exam today. Guaiac test negative, rectal tone wnl , prostate palpable , smooth, no nodules.  EKG Sinus Rhythm  87bpm Urine dip  wnl.    Assessment & Plan:   Annual physical exam , Decreased D3 to  2000IU/day recheck in  3 months. Diabetes Mellitus type  II, using two Metformin(Glucophage-XR 500mg ) 2 at  night and currently on his own and one in the morning. He was originally prescribed one in the morning and one at night. I Will discuss with Dr. Rosanna Randy,.Chart sent to Dr. Rosanna Randy for review. Per Dr. Rosanna Randy to go back to one tablet twice daily and lifestyle modification by  diet and exercise. Hypertension , well-controlled. Ventral hernia stable Gerd controlled with Prilosec, prescription given to patient. Seasonal allergies avoid Flonase  X 7 days to heal right side of nose, to restart Loratadine. Reviewed BMI with patient his BMI is  32.4 5'11" at 232. He says he has been eating on the go and not eating as well as he should. Discussed with patient the exercise and diet to help both his glucose and lipid numbers. Reviewed BMI with patient.  Will recheck  Labs in 3 months. He currently does not want to start a statin. He states he will work on eating healthier. Suggested some upper body exercises with weights daily. Patient verbalizes understanding and has no questions at discharge.  Meds ordered this encounter  Medications  . amLODipine (NORVASC) 10 MG tablet    Sig: Take 1 tablet (10 mg total) by mouth daily.    Dispense:  90 tablet    Refill:  0  . fluticasone (FLONASE) 50 MCG/ACT nasal spray    Sig: Place 2 sprays into both nostrils daily.    Dispense:  16 g    Refill:  6  . omeprazole (PRILOSEC) 20 MG capsule    Sig: Take 1 capsule (20 mg total) by mouth daily.    Dispense:  90 capsule    Refill:  0  . ramipril (ALTACE) 5 MG capsule    Sig: Take 1 capsule (5 mg total) by mouth daily. Please call office to  schedule labs now and blood pressure recheck in after    Dispense:  90 capsule    Refill:  0  . loratadine (CLARITIN) 10 MG tablet    Sig: Take 1 tablet (10 mg total) by mouth daily.    Dispense:  90 tablet    Refill:  1  . DISCONTD: metFORMIN (GLUCOPHAGE-XR) 500 MG 24 hr tablet    Sig: Take 1 tablet (500 mg total) by mouth 2 (two) times daily.    Dispense:  720 tablet    Refill:  0  . metFORMIN (GLUCOPHAGE-XR) 500 MG 24 hr tablet    Sig: Take 1 tablet (500 mg total) by mouth 2 (two) times daily.    Dispense:  184 tablet    Refill:  0

## 2018-04-15 ENCOUNTER — Other Ambulatory Visit: Payer: Self-pay | Admitting: Medical

## 2018-04-15 ENCOUNTER — Telehealth: Payer: Self-pay | Admitting: Medical

## 2018-04-15 DIAGNOSIS — E559 Vitamin D deficiency, unspecified: Secondary | ICD-10-CM

## 2018-04-15 DIAGNOSIS — E1165 Type 2 diabetes mellitus with hyperglycemia: Secondary | ICD-10-CM

## 2018-04-15 MED ORDER — METFORMIN HCL ER 500 MG PO TB24
500.0000 mg | ORAL_TABLET | Freq: Two times a day (BID) | ORAL | 0 refills | Status: DC
Start: 2018-04-15 — End: 2018-07-08

## 2018-04-15 MED ORDER — METFORMIN HCL ER 500 MG PO TB24: 500.0000 mg | ORAL_TABLET | Freq: Two times a day (BID) | ORAL | 0 refills | Status: DC

## 2018-04-15 NOTE — Telephone Encounter (Signed)
Called patient about his  Metformin ( Glucophage XR) 500mg  .   Patient was taking one in the morning and two tablets at night.  Reviewed with Dr. Rosanna Randy patient to go back  to his original prescribed dosage which is one tablet twice daily  And to exercise and diet to help lower his  A1C (7.0).  Patient to return in 3 months for recheck of his A1C and labs.  Patient verbalizes understanding and has no questions at the end of our conversation.

## 2018-05-19 ENCOUNTER — Ambulatory Visit: Payer: Self-pay | Admitting: Medical

## 2018-05-19 ENCOUNTER — Encounter: Payer: Self-pay | Admitting: Medical

## 2018-05-19 VITALS — BP 139/80 | HR 76 | Temp 99.0°F | Resp 18 | Wt 233.6 lb

## 2018-05-19 DIAGNOSIS — I776 Arteritis, unspecified: Secondary | ICD-10-CM

## 2018-05-19 MED ORDER — TRIAMCINOLONE ACETONIDE 0.1 % EX CREA: 1.0000 "application " | TOPICAL_CREAM | Freq: Two times a day (BID) | CUTANEOUS | 0 refills | Status: DC

## 2018-05-19 NOTE — Progress Notes (Signed)
   Subjective:    Patient ID: Cory Houston, male    DOB: October 23, 1956, 62 y.o.   MRN: [id number]  HPI 62 yo male in non acute distress , has "rash" on  Anterior lower legs and almost circumferential around ankles.x1. Walked 11 miles yesterday. And noticed at the end of the day when he took off his clothing and socks.  Usually gets this in the summer. Never had a doctor exam him before.. Does not itch or hurt.  Review of Systems  Constitutional: Negative for chills and fever.  HENT: Negative for ear discharge, rhinorrhea and sore throat.   Eyes: Negative for itching and visual disturbance.  Respiratory: Negative for shortness of breath.   Cardiovascular: Negative for chest pain, palpitations and leg swelling.  Gastrointestinal: Negative for abdominal pain.  Endocrine: Negative for polydipsia, polyphagia and polyuria.  Genitourinary: Negative for dysuria and hematuria.  Musculoskeletal: Negative for myalgias.  Skin: Positive for rash.  Allergic/Immunologic: Positive for environmental allergies. Negative for food allergies.  Neurological: Negative for dizziness, syncope and light-headedness.  Hematological: Negative for adenopathy.  Psychiatric/Behavioral: Negative for behavioral problems, self-injury and suicidal ideas.   Years ago  Was on prednisone burst and it seemed to make him "grumpy"    Objective:   Physical Exam  Constitutional: He is oriented to person, place, and time. He appears well-developed and well-nourished.  HENT:  Head: Normocephalic and atraumatic.  Eyes: Pupils are equal, round, and reactive to light. Conjunctivae and EOM are normal.  Neck: Normal range of motion.  Neurological: He is alert and oriented to person, place, and time.  Skin: Skin is warm. Rash (nonblanching rash on lower anterior legs) noted.  Psychiatric: He has a normal mood and affect. His behavior is normal. Judgment and thought content normal.  Nursing note and vitals reviewed.          Assessment & Plan:   Petichiae rash bilateral lower legs.  Picture sent to Dr. Rosanna Randy agrees on diagnosis and  recommends  Triamcinolone cream. Bid x 7-10 days.ordered CBC Met C, PT and PTT and sed rate Reviewed with patient , to return to clinic in one week for recheck. Patient verbalizes in one week and has no questions at discharge.

## 2018-05-19 NOTE — Patient Instructions (Signed)
vaVasculitis Vasculitis is swelling (inflammation) of the blood vessels. With vasculitis, the blood vessels can become thick, narrow, scarred, or weak, and enough blood may not be able to flow through them. This can cause damage to the muscles, kidneys, lungs, brain, and other parts of the body. There are many types of vasculitis. Some last only a short time while others last a long time. What are the causes? The exact cause is unknown, but vasculitis can develop when the body's immune system attacks its own blood vessels. This attack can be caused by:  An infection.  An immune system disease, such as lupus, rheumatoid arthritis, or scleroderma.  An allergic reaction to a medicine.  Cancer that affects blood cells, such as leukemia and lymphoma.  What increases the risk?  Being a smoker.  Being under stress.  Having a physical injury. What are the signs or symptoms? Symptoms vary depending on the type of vasculitis you have. Symptoms that are common to all types of vasculitis include:  Fever.  Poor appetite.  Weight loss.  Feeling very tired.  Having aches and pains.  Weakness.  Numbness in an area of your body.  Symptoms for specific types of vasculitis include:  Skin problems, such as sores, spots, or rashes.  Trouble seeing.  Trouble breathing.  Blood in your urine.  Headaches.  Stomach pain.  Stuffy or bloody nose.  How is this diagnosed? Your health care provider will ask about your symptoms and do a physical exam. You may have tests done, such as:  A complete blood count (CBC).  Erythrocyte sedimentation, also called sed rate test.  C-reactive protein (CRP).  Antineutrophil cytoplasmic antibodies (ANCA).  A urine test.  A biopsy of a blood vessel.  A nerve conduction study.  Imaging tests, such as: ? X-rays. ? A CT scan. ? An ultrasound. ? An MRI. ? Angiography.  How is this treated? Treatment will depend on the type of vasculitis  you have and how severe it is. Sometimes treatment is not needed. Treatment often includes:  Medicines.  Physical therapy or occupational therapy. This helps strengthen muscles that were weakened by the disease.  You will need to see your health care provider while you are being treated. During follow-up visits, your health care provider will:  Perform blood tests and bone density tests.  Check your blood pressure and blood sugar.  Check for side effects of any medicines you are taking.  Vasculitis cannot always be cured. Sometimes symptoms go away but the disease does not (the disease goes in remission). If symptoms return, increased treatment may be needed. Follow these instructions at home:  Take medicines only as directed by your health care provider.  Keep all follow-up visits as directed by your health care provider. This is important.  Exercise. Talk with your health care provider about what exercises are okay for you to do. Usually exercises that increase your heart rate (aerobic exercise), such as walking, are recommended. Aerobic exercise helps control your blood pressure and prevent bone loss.  Follow a healthy diet. Include healthy sources of protein, fruits, vegetables, and whole grains in your diet.  Learn as much as you can about vasculitis, and consider joining a support group. Understanding your condition and talking with others who have it may help you cope. Talk with your health care provider if you feel stressed, anxious, or depressed. Contact a health care provider if:  Your symptoms return, or you have new symptoms.  Your fever, fatigue, headache, or  weight loss gets worse.  You have signs of infection, such as fever, warmth, tenderness, redness, or swelling. Get help right away if:  Your vision gets worse.  Your pain does not go away, even after you take pain medicine.  You have chest or stomach pain.  You have trouble breathing.  One side of your  face or body suddenly becomes weak or numb.  Your nose bleeds.  There is blood in your urine. This information is not intended to replace advice given to you by your health care provider. Make sure you discuss any questions you have with your health care provider. Document Released: 08/31/2009 Document Revised: 04/11/2016 Document Reviewed: 12/29/2013 Elsevier Interactive Patient Education  [employer].

## 2018-05-20 ENCOUNTER — Telehealth: Payer: Self-pay | Admitting: Medical

## 2018-05-20 LAB — CBC WITH DIFFERENTIAL/PLATELET
Basophils Absolute: 0 10*3/uL (ref 0.0–0.2)
Basos: 0 %
EOS (ABSOLUTE): 0.1 10*3/uL (ref 0.0–0.4)
EOS: 1 %
HEMATOCRIT: 43.5 % (ref 37.5–51.0)
HEMOGLOBIN: 15.6 g/dL (ref 13.0–17.7)
IMMATURE GRANULOCYTES: 0 %
Immature Grans (Abs): 0 10*3/uL (ref 0.0–0.1)
LYMPHS: 22 %
Lymphocytes Absolute: 1.7 10*3/uL (ref 0.7–3.1)
MCH: 29.8 pg (ref 26.6–33.0)
MCHC: 35.9 g/dL — ABNORMAL HIGH (ref 31.5–35.7)
MCV: 83 fL (ref 79–97)
MONOCYTES: 8 %
Monocytes Absolute: 0.6 10*3/uL (ref 0.1–0.9)
NEUTROS PCT: 69 %
Neutrophils Absolute: 5.4 10*3/uL (ref 1.4–7.0)
Platelets: 230 10*3/uL (ref 150–450)
RBC: 5.23 x10E6/uL (ref 4.14–5.80)
RDW: 14.8 % (ref 12.3–15.4)
WBC: 7.8 10*3/uL (ref 3.4–10.8)

## 2018-05-20 LAB — COMPREHENSIVE METABOLIC PANEL
ALBUMIN: 4.6 g/dL (ref 3.5–5.5)
ALT: 36 IU/L (ref 0–44)
AST: 23 IU/L (ref 0–40)
Albumin/Globulin Ratio: 1.8 (ref 1.2–2.2)
Alkaline Phosphatase: 135 IU/L — ABNORMAL HIGH (ref 39–117)
BILIRUBIN TOTAL: 0.3 mg/dL (ref 0.0–1.2)
BUN / CREAT RATIO: 13 (ref 9–20)
BUN: 15 mg/dL (ref 6–24)
CALCIUM: 9.2 mg/dL (ref 8.7–10.2)
CO2: 23 mmol/L (ref 20–29)
Chloride: 103 mmol/L (ref 96–106)
Creatinine, Ser: 1.19 mg/dL (ref 0.76–1.27)
GFR calc Af Amer: 77 mL/min/{1.73_m2} (ref >59)
GFR, EST NON AFRICAN AMERICAN: 66 mL/min/{1.73_m2} (ref >59)
GLUCOSE: 142 mg/dL — AB (ref 65–99)
Globulin, Total: 2.5 g/dL (ref 1.5–4.5)
Potassium: 4.3 mmol/L (ref 3.5–5.2)
Sodium: 140 mmol/L (ref 134–144)
TOTAL PROTEIN: 7.1 g/dL (ref 6.0–8.5)

## 2018-05-20 LAB — PT AND PTT
INR: 1.1 (ref 0.8–1.2)
Prothrombin Time: 11.7 s (ref 9.1–12.0)
aPTT: 31 s (ref 24–33)

## 2018-05-20 LAB — SEDIMENTATION RATE: SED RATE: 20 mm/h (ref 0–30)

## 2018-05-20 NOTE — Telephone Encounter (Signed)
Lab reviewed with  Patient, improved since one month ago. PTand PTT and sed rate all wnl.   Patient states he used triamcinolone cream last night and had improvement this morning.  Did use this am. Has all ready walked 4 miles today and it seems like it has returned to its same baseline.   Still no pain. He is to return in onr week for recheck.  Pattent verbalizes understanding and has no questions at discharge.

## 2018-05-26 ENCOUNTER — Encounter: Payer: Self-pay | Admitting: Medical

## 2018-05-26 ENCOUNTER — Ambulatory Visit: Payer: Self-pay | Admitting: Medical

## 2018-05-26 VITALS — BP 131/81 | HR 87 | Temp 98.5°F | Resp 16

## 2018-05-26 DIAGNOSIS — Z0000 Encounter for general adult medical examination without abnormal findings: Secondary | ICD-10-CM

## 2018-05-26 DIAGNOSIS — L959 Vasculitis limited to the skin, unspecified: Secondary | ICD-10-CM

## 2018-05-26 NOTE — Progress Notes (Signed)
   Subjective:    Patient ID: Cory Houston, male    DOB: 07/31/1956, 62 y.o.   MRN: [id number]  HPI  62 yo male in non acute distress. Returns to clinic for recheck of vasculitis of the lower leg skin.  After using the medication ( Trimacinolone cream)  For  2-3 days the vasculitis resolved). He has walked  24,000 steps today. No rash , no redness, skin with in normal limits. No pain.  Review of Systems  Constitutional: Negative for chills and fever.  HENT: Negative for congestion, ear pain and sore throat.   Eyes: Negative for discharge, itching and visual disturbance.  Respiratory: Positive for cough (dry cough thinks it is due to the dust.).   Gastrointestinal: Negative for abdominal pain.  Endocrine: Negative for polydipsia, polyphagia and polyuria.  Genitourinary: Negative for dysuria.  Musculoskeletal: Negative for gait problem and joint swelling.  Skin: Negative for rash.  Neurological: Negative for dizziness, syncope and light-headedness.  Hematological: Negative for adenopathy.  Psychiatric/Behavioral: Negative for behavioral problems, confusion, self-injury and suicidal ideas.       Objective:   Physical Exam  Constitutional: He is oriented to person, place, and time. He appears well-developed and well-nourished.  HENT:  Head: Normocephalic and atraumatic.  Eyes: Pupils are equal, round, and reactive to light. Conjunctivae and EOM are normal.  Neurological: He is alert and oriented to person, place, and time.  Skin: Skin is warm and dry. No rash noted. No erythema.  Psychiatric: He has a normal mood and affect. His behavior is normal. Judgment and thought content normal.    Lower legs cleared up completely of vasculitis rash.  2+DP/PT bilaterally. Full weight bearing , steady gait.     Assessment & Plan:  Vasculitis lower legs resolved. Stop cream may use again if needed, limit  7 days maximum consecutive days.  Return to the clinic as needed. Follow up will be in  6 months with labs.  Last labs  05/19/18 were not fasting.

## 2018-07-08 ENCOUNTER — Other Ambulatory Visit: Payer: Self-pay | Admitting: Medical

## 2018-07-08 DIAGNOSIS — K219 Gastro-esophageal reflux disease without esophagitis: Secondary | ICD-10-CM

## 2018-07-08 DIAGNOSIS — E119 Type 2 diabetes mellitus without complications: Secondary | ICD-10-CM

## 2018-09-07 ENCOUNTER — Encounter: Payer: Self-pay | Admitting: Medical

## 2018-09-07 ENCOUNTER — Ambulatory Visit: Payer: Self-pay | Admitting: Medical

## 2018-09-07 VITALS — BP 140/96 | HR 98 | Temp 98.8°F | Resp 18 | Wt 233.0 lb

## 2018-09-07 DIAGNOSIS — R0982 Postnasal drip: Secondary | ICD-10-CM

## 2018-09-07 DIAGNOSIS — J301 Allergic rhinitis due to pollen: Secondary | ICD-10-CM

## 2018-09-07 MED ORDER — AZELASTINE-FLUTICASONE 137-50 MCG/ACT NA SUSP: 1.0000 ug | Freq: Two times a day (BID) | NASAL | 1 refills | Status: DC

## 2018-09-07 NOTE — Progress Notes (Signed)
   Subjective:    Patient ID: Cory Houston, male    DOB: 03-06-1956, 62 y.o.   MRN: [id number]  HPI 62 yo male in non acute distress. Presents with complaints of  7 days with nasal congestion, having trouble using his sleep apnea machine do to nasal congestion.. Doing  OTC Flonase and   Loratadine. Ater 30 min nasal congestion with the Cpap does clean it  3-4 x  / week. Some mild sinus pressure center of  Forehead, no fever or chills , no cough.   Has been exposed to dust , wearing mask , and he knows the ragweed has been high. No nasal discharge," all post nasal drip".   Blood pressure (!) 140/96, pulse 98, temperature 98.8 F (37.1 C), temperature source Tympanic, resp. rate 18, weight 233 lb (105.7 kg), SpO2 97 %.  Review of Systems  Constitutional: Negative for chills and fever.  HENT: Positive for congestion, postnasal drip and sinus pressure. Negative for ear discharge, ear pain, sinus pain and sore throat.   Eyes: Negative for visual disturbance.  Respiratory: Positive for cough (mild). Negative for shortness of breath.   Cardiovascular: Negative for chest pain.  Neurological: Negative for dizziness, syncope and light-headedness.  Psychiatric/Behavioral: Negative for behavioral problems.   He states he is having a hard time with his supervisor currently and has had an upsetting morning.    Objective:   Physical Exam  Constitutional: He is oriented to person, place, and time. He appears well-developed and well-nourished.  HENT:  Head: Normocephalic and atraumatic.  Right Ear: External ear normal. A middle ear effusion (very) is present.  Left Ear: External ear normal. A middle ear effusion (very) is present.  Mouth/Throat: Oropharynx is clear and moist.  Eyes: Pupils are equal, round, and reactive to light. Conjunctivae and EOM are normal.  Neck: Normal range of motion. Neck supple.  Lymphadenopathy:    He has no cervical adenopathy.  Neurological: He is alert and oriented  to person, place, and time.  Skin: Skin is warm and dry.  Psychiatric: He has a normal mood and affect. His behavior is normal. Judgment and thought content normal.  Nursing note and vitals reviewed.         Assessment & Plan:  Allergic Rhinitis, Post nasal drip Eustachian tube dysfunction bilaterally Stop OTC Flonase Hypertension elevated today. Meds ordered this encounter  Medications  . Azelastine-Fluticasone 137-50 MCG/ACT SUSP    Sig: Place 1 mcg into the nose 2 (two) times daily for 120 doses.    Dispense:  1 Bottle    Refill:  1  return in one week for recheck. Patient verbalizes understanding and has no questions at discharge.  Note given to patient 09/07/18.

## 2018-09-09 ENCOUNTER — Encounter: Payer: Self-pay | Admitting: *Deleted

## 2018-09-14 ENCOUNTER — Other Ambulatory Visit: Payer: Self-pay | Admitting: Medical

## 2018-09-14 DIAGNOSIS — R0982 Postnasal drip: Secondary | ICD-10-CM

## 2018-09-14 DIAGNOSIS — J301 Allergic rhinitis due to pollen: Secondary | ICD-10-CM

## 2018-09-14 MED ORDER — AZELASTINE-FLUTICASONE 137-50 MCG/ACT NA SUSP: 1.0000 ug | Freq: Two times a day (BID) | NASAL | 6 refills | Status: DC

## 2018-09-14 NOTE — Progress Notes (Signed)
Refill of  Dymista nasal spray .  Meds ordered this encounter  Medications  . Azelastine-Fluticasone 137-50 MCG/ACT SUSP    Sig: Place 1 mcg into the nose 2 (two) times daily for 120 doses.    Dispense:  1 Bottle    Refill:  6    Sent to [facility] [place].

## 2018-09-15 ENCOUNTER — Ambulatory Visit: Payer: Self-pay | Admitting: Medical

## 2018-09-17 ENCOUNTER — Telehealth: Payer: Self-pay | Admitting: Adult Health

## 2018-09-17 NOTE — Telephone Encounter (Signed)
Patient called 09/17/18 to report pharmacy has not heard backl regardinig prior authorization for DYMISTA nasal spray. The pharmacist told him it may be 2 weeks. This office as below note states has already sent porir authorization.  Patinet was instructed to call his insurance company- number on back of card and see what is holding up the process. He will call the office back if he has not heard within two weeks as suggested by pharmacy around 11/6.  He will cal this office back if any concerns or further difficulkty.  He will continue to use Flonase until this is approved.   Advised patient call the office or your primary care doctor for an appointment if no improvement within 72 hours or if any symptoms change or worsen at any time  Advised ER or urgent Care if after hours or on weekend. Call 911 for emergency symptoms at any time.Patinet verbalized understanding of all instructions given/reviewed and treatment plan and has no further questions or concerns at this time.    Patient verbalized understanding of all instructions given and denies any further questions at this time.     He reports he received the below message.  Note from nursing 09/09/18 below:  Hi Cory Houston, I wanted to let you know we have been working on the prior authorization from insurance since Monday. They have all the information and [employer] should make a decision in the next 3 days and let us or the pharmacy know. Hopefully sooner rather than later. Feel free to call you pharmacy Friday afternoon if you have not heard anything and check to see if the medication will go through. Let us know if you have any questions [phone]. Have a good rest of the week! - Brandy RN    This MyChart message has not been read.

## 2018-09-23 ENCOUNTER — Encounter: Payer: Self-pay | Admitting: *Deleted

## 2018-09-28 ENCOUNTER — Telehealth: Payer: Self-pay | Admitting: Medical

## 2018-09-28 NOTE — Telephone Encounter (Signed)
Revceved from [place] denial on [place] because  Second line  Medication has not been tried.  Will offer patient Azelastine NS to patient or ENT evaluation. Discussed with RN BCozart she will call him and give him his choices.

## 2018-09-29 ENCOUNTER — Telehealth: Payer: Self-pay | Admitting: *Deleted

## 2018-09-29 ENCOUNTER — Encounter: Payer: Self-pay | Admitting: *Deleted

## 2018-09-29 ENCOUNTER — Other Ambulatory Visit: Payer: Self-pay | Admitting: Medical

## 2018-09-29 DIAGNOSIS — J301 Allergic rhinitis due to pollen: Secondary | ICD-10-CM

## 2018-09-29 MED ORDER — AZELASTINE HCL 137 MCG/SPRAY NA SOLN: 1.0000 | Freq: Two times a day (BID) | NASAL | 0 refills | Status: DC

## 2018-09-29 NOTE — Progress Notes (Signed)
Patient denied  Dymista by [facility] , they would like [place] to try Azelastine for nasal congestion due to pollen year round first. Patient already on Flonase, to continue use.  Both together Flonase and Azelastine NS are equal the concentration of Dymista. [facility] [address]

## 2018-09-29 NOTE — Telephone Encounter (Signed)
See phone note

## 2018-10-07 ENCOUNTER — Telehealth: Payer: Self-pay | Admitting: Adult Health

## 2018-10-07 DIAGNOSIS — R0982 Postnasal drip: Secondary | ICD-10-CM

## 2018-10-07 DIAGNOSIS — J301 Allergic rhinitis due to pollen: Secondary | ICD-10-CM

## 2018-10-07 DIAGNOSIS — Z889 Allergy status to unspecified drugs, medicaments and biological substances status: Secondary | ICD-10-CM

## 2018-10-07 DIAGNOSIS — E1165 Type 2 diabetes mellitus with hyperglycemia: Secondary | ICD-10-CM

## 2018-10-07 NOTE — Telephone Encounter (Signed)
  Orders Placed This Encounter  Procedures  . Ambulatory referral to ENT    Referral Priority:   Urgent    Referral Type:   Consultation    Referral Reason:   Specialty Services Required    Referred to Provider:   Beverly Gust, MD    Requested Specialty:   Otolaryngology    Number of Visits Requested:   [place] note from [place] RN:   HI Delane, we got a notification the Dymista nasal spray was denied. They want you to try another nasal spray first. Nira Conn is going to send in prescription for Azelastine nasal spray, use this ALONG with your Flonase and essentially you will have the ingredients in [place]! So you will be getting the same medication as Dymista, you will just have to use 2 nasal sprays to get it instead of 1. If you have any questions please call us [phone]. Follow up with Heather in 30 days for a recheck so we can evaluate if it is working. Have a good day! -Musician

## 2018-10-08 ENCOUNTER — Telehealth: Payer: Self-pay

## 2018-10-08 NOTE — Telephone Encounter (Signed)
Patient c/o increased congestion during the night but states that he is fine during the day while he is active.  He is waiting for a call from ENT Referral that was placed yesterday.  He will come back in the am to see [place] for a follow up appointment to reevaluate his symptoms.

## 2018-10-09 ENCOUNTER — Ambulatory Visit: Payer: Self-pay | Admitting: Adult Health

## 2018-10-09 ENCOUNTER — Encounter: Payer: Self-pay | Admitting: Adult Health

## 2018-10-09 VITALS — BP 138/88 | HR 75 | Temp 98.2°F | Resp 16 | Ht 72.0 in | Wt 233.0 lb

## 2018-10-09 DIAGNOSIS — J301 Allergic rhinitis due to pollen: Secondary | ICD-10-CM

## 2018-10-09 DIAGNOSIS — Z836 Family history of other diseases of the respiratory system: Secondary | ICD-10-CM

## 2018-10-09 DIAGNOSIS — Z0000 Encounter for general adult medical examination without abnormal findings: Secondary | ICD-10-CM

## 2018-10-09 MED ORDER — CETIRIZINE HCL 10 MG PO TABS: 10.0000 mg | ORAL_TABLET | Freq: Every day | ORAL | 1 refills | Status: DC

## 2018-10-09 MED ORDER — FLUTICASONE PROPIONATE 50 MCG/ACT NA SUSP: 2.0000 | Freq: Every day | NASAL | 6 refills | Status: DC

## 2018-10-09 NOTE — Patient Instructions (Addendum)
November 26th 3pm is your ENT appointment with [facility].     Nasal Allergies Nasal allergies are a reaction to allergens in the air. Allergens are tiny specks (particles) in the air that cause your body to have an allergic reaction. Nasal allergies are not passed from person to person (contagious). They cannot be cured, but they can be controlled. Common causes of nasal allergies include:  Pollen from grasses, trees, and weeds.  House dust mites.  Pet dander.  Mold.  Follow these instructions at home:  Avoid the allergen that is causing your symptoms, if you can.  Keep windows closed. If possible, use air conditioning when there is a lot of pollen in the air.  Do not use fans in your home.  Do not hang clothes outside to dry.  Wear sunglasses to keep pollen out of your eyes.  Wash your hands right away after you touch household pets.  Take over-the-counter and prescription medicines only as told by your doctor.  Keep all follow-up visits as told by your doctor. This is important. Contact a doctor if:  You have a fever.  You have a cough that does not go away (is persistent).  You start to make whistling sounds when you breathe (wheeze).  Your symptoms do not get better with treatment.  You have thick fluid coming from your nose.  You start to have nosebleeds. Get help right away if:  Your tongue or your lips are swollen.  You have trouble breathing.  You feel light-headed or you feel like you are going to pass out (faint).  You have cold sweats. This information is not intended to replace advice given to you by your health care provider. Make sure you discuss any questions you have with your health care provider. Document Released: 03/06/2011 Document Revised: 04/11/2016 Document Reviewed: 05/17/2015 Elsevier Interactive Patient Education  [employer].

## 2018-10-09 NOTE — Progress Notes (Signed)
Subjective:     Patient ID: Cory Houston, male   DOB: 14-Apr-1956, 62 y.o.   MRN: [id number]  HPI  Blood pressure 138/88, pulse 75, temperature 98.2 F (36.8 C), resp. rate 16, height 6' (1.829 m), weight 233 lb (105.7 kg), SpO2 98 %.   Patient is a 62 year old male in no acute distress who comes to the clinic with nasal congestion that is worse at night and he is not able to wear his CPAP properly due to this at times. He is usually wearing 4-5 hours at night.   He says he is waking up at night at around 3 am and feels he can breath and then wears his CPAP machine.   He is currently using Flonase  and Claritin. He did not try the new azelastine spray sent in on November 12th. He said did not see message.  He understands to use Flonase and azelastine together as prescribed/ per package instructions as his insurance has denied Dymista. Nursing is calling now to see if we can get in with ENT earlier.    Post nasal drip in the morning and " stuffy" nasal congestion all night with post nasal drip sitting in throat when he awaked intermittently. Denies bad taste or odor.  No drainage from nose only post nasal he reports. He reports his nasal allergies are worse when he gets home in the evening. He reports he is showering off and changing his clothes as soon as he gets in from outdoor.  He works outdoors all day.  Denies using any other over the counter nasal sprays such as Afrin.  He denies any edema, abdominal bloating or  palpitations. Denies chest pain or shortness of breath with activity or at rest. Denies any arm, jaw or neck discomfort.  He denies ever smoking. He denies pain with inspiration or expiration.   Patient  denies any fever, body aches,chills, rash, chest pain, shortness of breath, nausea, vomiting, or diarrhea.   Urinating normally. Bowels normal he reports. Denies bleeding or dark colored stools.   He reports dad had history of cardiopulmonary edema.- Father 36 deceased  from complications.  family history includes Cataracts in his sister; Congestive Heart Failure in his father and mother; Diabetes in his father and mother; Diverticulitis in his father; Heart attack in his maternal grandmother; Heart disease in his mother; Heart murmur in his son; Hypercholesterolemia in his father and mother; Hypertension in his father, mother, paternal aunt, and paternal uncle; Lung cancer in his maternal grandmother; Obesity in his mother; Stroke in his paternal grandfather and paternal grandmother.   Denies ever smoking. Denies vaping or smokeless tobacco.   Review of Systems  Constitutional: Negative.   HENT: Positive for congestion, postnasal drip and rhinorrhea. Negative for dental problem, drooling, ear discharge, ear pain, facial swelling, hearing loss, mouth sores, nosebleeds, sinus pressure, sinus pain, sneezing, sore throat, tinnitus, trouble swallowing and voice change.   Eyes: Negative.   Respiratory: Positive for cough. Negative for apnea, choking, chest tightness, shortness of breath, wheezing and stridor.   Cardiovascular: Negative.  Negative for chest pain, palpitations and leg swelling.  Gastrointestinal: Negative for abdominal distention, abdominal pain, anal bleeding, blood in stool, constipation, diarrhea, nausea, rectal pain and vomiting.       Denies any GI symptoms. Denies rectal bleeding. Requests colonoscopy referral reports he has never had previous.  Denies family history.   Endocrine: Negative.   Genitourinary: Negative.   Musculoskeletal: Negative.   Skin: Negative.  Allergic/Immunologic: Positive for environmental allergies. Negative for food allergies and immunocompromised state.  Neurological: Negative.   Psychiatric/Behavioral: Negative.        Objective:   Physical Exam  Constitutional: He is oriented to person, place, and time. Vital signs are normal. He appears well-developed and well-nourished. He is active.  Non-toxic appearance. He  does not have a sickly appearance. He does not appear ill. No distress. He is not intubated.  Patient is alert and oriented and responsive to questions Engages in eye contact with provider. Speaks in full sentences without any pauses without any shortness of breath or distress.    HENT:  Head: Normocephalic and atraumatic.  Right Ear: Hearing and external ear normal. No swelling or tenderness. Tympanic membrane is not perforated and not erythematous. A middle ear effusion is present.  Left Ear: Hearing and external ear normal. No swelling or tenderness. Tympanic membrane is not perforated and not erythematous. A middle ear effusion (moderate bilaterally clear fluid. ) is present.  Nose: Mucosal edema and rhinorrhea present. No sinus tenderness. No epistaxis. Right sinus exhibits no maxillary sinus tenderness and no frontal sinus tenderness. Left sinus exhibits no maxillary sinus tenderness and no frontal sinus tenderness.  Mouth/Throat: Uvula is midline, oropharynx is clear and moist and mucous membranes are normal. No uvula swelling. No oropharyngeal exudate, posterior oropharyngeal edema, posterior oropharyngeal erythema or tonsillar abscesses.  Clear postnasal drip  Visualized in  Oral pharynx  Eyes: Pupils are equal, round, and reactive to light. Conjunctivae, EOM and lids are normal. Lids are everted and swept, no foreign bodies found. Right eye exhibits no discharge. Left eye exhibits no discharge. No scleral icterus.  Neck: Normal range of motion. Neck supple. No JVD present. No tracheal deviation present. No thyromegaly present.  Cardiovascular: Normal rate, regular rhythm, normal heart sounds and intact distal pulses. Exam reveals no gallop and no friction rub.  No murmur heard. Pulses:      Radial pulses are 2+ on the right side, and 2+ on the left side.       Posterior tibial pulses are 2+ on the right side, and 2+ on the left side.  No carotid bruit ausculted bilaterally. Negative for  upper or lower extremity edema.    Pulmonary/Chest: Effort normal and breath sounds normal. No accessory muscle usage or stridor. No apnea, no tachypnea and no bradypnea. He is not intubated. No respiratory distress. He has no decreased breath sounds. He has no wheezes. He has no rhonchi. He has no rales. He exhibits no bony tenderness, no edema, no swelling and no retraction.  No adventitious breath sounds ausculted.   Abdominal: Soft. Normal appearance and bowel sounds are normal. He exhibits no ascites and no pulsatile midline mass.  No  Abdominal edema.   Musculoskeletal: Normal range of motion.  Lymphadenopathy:       Head (right side): No submental, no submandibular, no tonsillar, no preauricular, no posterior auricular and no occipital adenopathy present.       Head (left side): No submental, no submandibular, no tonsillar, no preauricular, no posterior auricular and no occipital adenopathy present.    He has no cervical adenopathy.  Neurological: He is alert and oriented to person, place, and time. He has normal strength. He displays a negative Romberg sign.  Skin: Skin is warm. Capillary refill takes less than 2 seconds. No rash noted. He is not diaphoretic. Nails show no clubbing.  Psychiatric: He has a normal mood and affect. His speech is normal and  behavior is normal. Judgment and thought content normal. Cognition and memory are normal.  Vitals reviewed.      Assessment:     Family history of cardio pulmonary edema- father   Non-seasonal allergic rhinitis due to pollen - Plan: fluticasone (FLONASE) 50 MCG/ACT nasal spray, DG Chest 2 View  Healthcare maintenance - Plan: Ambulatory referral to Gastroenterology      Plan:   November 26th 3pm Tuesday is his appointment with ENT- [place].   He will try Zyrtec per package instructions - sent to pharmacy as cheaper for patient. He will pick up Flonase and Azestaline and use as previously directed. Dymista was declined.   He  will go for baseline chest x ray walk in [facility] Exam given family history/ physical exam reassuring discussed with patient.     He also reports he needs colonoscopy. Order placed for referral as below.   Keep already discussed follow up with your Ratcliffe, Estill Dooms, PA-C PCP.    Orders Placed This Encounter  Procedures  . DG Chest 2 View    Standing Status:   Future    Standing Expiration Date:   11/08/2018    Order Specific Question:   Reason for Exam (SYMPTOM  OR DIAGNOSIS REQUIRED)    Answer:   dad history of pulmonary edema/ would like baseline on patient. / asymptomatic    Order Specific Question:   Preferred imaging location?    Answer:   [facility]    Order Specific Question:   Call Results- Best Contact Number?    Answer:   [phone]    Order Specific Question:   Radiology Contrast Protocol - do NOT remove file path    Answer:   \\[email or web address]  . Ambulatory referral to Gastroenterology    Referral Priority:   Routine    Referral Type:   Consultation    Referral Reason:   Specialty Services Required    Referred to Provider:   Lucilla Lame, MD    Number of Visits Requested:   1    Meds ordered this encounter  Medications  . fluticasone (FLONASE) 50 MCG/ACT nasal spray    Sig: Place 2 sprays into both nostrils daily.    Dispense:  16 g    Refill:  6  . cetirizine (ZYRTEC) 10 MG tablet    Sig: Take 1 tablet (10 mg total) by mouth daily.    Dispense:  90 tablet    Refill:  1  He requested refill on above Flonase and requested Zyrtec sent to pharmacy. Discontinue Claritin. ( has been on Claritin for 8 + months he reports)   He will call if he has not heard from colonoscopy referral within one week from today. Advised patient call the office or your primary care doctor for an appointment if no improvement within 72 hours or if any symptoms change or worsen at any time  Advised ER or urgent Care if after  hours or on weekend. Call 911 for emergency symptoms at any time.Patinet verbalized understanding of all instructions given/reviewed and treatment plan and has no further questions or concerns at this time.    Patient verbalized understanding of all instructions given and denies any further questions at this time.

## 2018-11-21 ENCOUNTER — Other Ambulatory Visit: Payer: Self-pay | Admitting: Medical

## 2018-11-21 DIAGNOSIS — I10 Essential (primary) hypertension: Secondary | ICD-10-CM

## 2018-11-23 ENCOUNTER — Other Ambulatory Visit: Payer: Self-pay | Admitting: Medical

## 2018-11-23 DIAGNOSIS — Z889 Allergy status to unspecified drugs, medicaments and biological substances status: Secondary | ICD-10-CM

## 2018-11-23 DIAGNOSIS — K219 Gastro-esophageal reflux disease without esophagitis: Secondary | ICD-10-CM

## 2018-11-24 ENCOUNTER — Encounter: Payer: Self-pay | Admitting: *Deleted

## 2018-12-29 ENCOUNTER — Ambulatory Visit: Payer: Self-pay | Admitting: Medical

## 2018-12-29 ENCOUNTER — Encounter: Payer: Self-pay | Admitting: Medical

## 2018-12-29 VITALS — BP 133/82 | HR 98 | Temp 97.8°F | Resp 18 | Wt 235.4 lb

## 2018-12-29 DIAGNOSIS — Z20828 Contact with and (suspected) exposure to other viral communicable diseases: Secondary | ICD-10-CM

## 2018-12-29 DIAGNOSIS — J101 Influenza due to other identified influenza virus with other respiratory manifestations: Secondary | ICD-10-CM

## 2018-12-29 LAB — POCT INFLUENZA A/B
INFLUENZA A, POC: NEGATIVE
Influenza B, POC: POSITIVE — AB

## 2018-12-29 MED ORDER — OSELTAMIVIR PHOSPHATE 75 MG PO CAPS: 75.0000 mg | ORAL_CAPSULE | Freq: Two times a day (BID) | ORAL | 0 refills | Status: DC

## 2018-12-29 NOTE — Progress Notes (Signed)
Subjective:    Patient ID: [name], male    DOB: 1956-03-08, 63 y.o.   MRN: [id number]  HPI 63 yo male in non acute distress. Presents today  Feels better than yesterday. Cough non productive, sore throat some mild fatigue  Symptoms starting Monday. Exposure to the Flu from daughter who has the Flu. diagnosed by on Monday and felt better  By Monday. No antipyretics today. Traveling to [place] for brothers funeral .Died in his sleep , unsure of reason  death  ENT referral on Azelastine  NS using this with Flonase.  Blood pressure 133/82, pulse 98, temperature 97.8 F (36.6 C), temperature source Tympanic, resp. rate 18, weight 235 lb 6.4 oz (106.8 kg), SpO2 97 %. Allergies  Allergen Reactions  . Codeine Anaphylaxis    Review of Systems  Constitutional: Negative for appetite change, chills and fever.  HENT: Positive for congestion, postnasal drip, sneezing and sore throat. Negative for ear pain, rhinorrhea, sinus pressure and sinus pain.   Eyes: Negative for discharge and itching.  Respiratory: Positive for cough, chest tightness and shortness of breath.   Cardiovascular: Negative for chest pain.  Gastrointestinal: Positive for diarrhea (yesterday  x 2). Negative for abdominal pain, nausea and vomiting.  Genitourinary: Negative for dysuria.  Musculoskeletal: Negative for myalgias.  Skin: Negative for rash.  Neurological: Positive for light-headedness (forehead last night took nothing for it.). Negative for dizziness.  Hematological: Negative for adenopathy.  Psychiatric/Behavioral: Negative for behavioral problems, self-injury and suicidal ideas.      Flu vaccine  2019    Physical Exam Vitals signs and nursing note reviewed.  Constitutional:      Appearance: Normal appearance.  HENT:     Head: Normocephalic and atraumatic.     Jaw: There is normal jaw occlusion.     Right Ear: Ear canal and external ear normal. A middle ear effusion is present.     Left Ear: Ear  canal and external ear normal. A middle ear effusion is present.     Nose: Congestion and rhinorrhea present. Rhinorrhea is clear.     Right Turbinates: Enlarged and swollen.     Left Turbinates: Not enlarged or swollen.     Mouth/Throat:     Lips: Pink.     Mouth: Mucous membranes are moist.     Pharynx: Posterior oropharyngeal erythema (mild) present. No pharyngeal swelling, oropharyngeal exudate or uvula swelling.     Tonsils: No tonsillar exudate or tonsillar abscesses. Swelling: 2+ on the right. 2+ on the left.  Eyes:     Extraocular Movements: Extraocular movements intact.     Conjunctiva/sclera: Conjunctivae normal.     Pupils: Pupils are equal, round, and reactive to light.  Neck:     Musculoskeletal: Normal range of motion and neck supple.  Cardiovascular:     Rate and Rhythm: Normal rate and regular rhythm.  Pulmonary:     Effort: Pulmonary effort is normal.     Breath sounds: Normal breath sounds.  Musculoskeletal: Normal range of motion.  Skin:    General: Skin is warm and dry.  Neurological:     General: No focal deficit present.     Mental Status: He is alert and oriented to person, place, and time.  Psychiatric:        Mood and Affect: Mood normal.        Behavior: Behavior normal.        Thought Content: Thought content normal.  Judgment: Judgment normal.     Lateral right nare with small area of crusting  Flu test B positve    Assessment & Plan:  Influenza B Meds ordered this encounter  Medications  . oseltamivir (TAMIFLU) 75 MG capsule    Sig: Take 1 capsule (75 mg total) by mouth 2 (two) times daily.    Dispense:  10 capsule    Refill:  0  Rest increase fluids, OTC Motrin or Tylenol per packaage for fever/ pain/ aches.  Rest and increase fluids. Return in  3-5 days if not improving or if worsening.  Patient verbalizes understanding and has no questions at discharge.

## 2019-01-22 ENCOUNTER — Ambulatory Visit: Payer: Self-pay | Admitting: Nurse Practitioner

## 2019-01-22 ENCOUNTER — Encounter: Payer: Self-pay | Admitting: Nurse Practitioner

## 2019-01-22 ENCOUNTER — Other Ambulatory Visit: Payer: Self-pay

## 2019-01-22 VITALS — BP 128/90 | HR 87 | Temp 98.2°F | Resp 18 | Ht 71.0 in | Wt 236.0 lb

## 2019-01-22 DIAGNOSIS — R0982 Postnasal drip: Secondary | ICD-10-CM

## 2019-01-22 DIAGNOSIS — J3089 Other allergic rhinitis: Secondary | ICD-10-CM

## 2019-01-22 NOTE — Patient Instructions (Signed)
Get over the counter normal saline spray/mist with aloe and use as directed but at least twice daily. After pm dose of normal saline use Flonase 1 spray Hold Azelestine spray for now Change Zyrtec to Claritin since you felt that worked better for you and you've been on it for a while now Follow up with primary care provider in 2 weeks to recheck blood pressure Encouraged patient to call the office or primary care doctor for an appointment if no improvement in symptoms or if symptoms change or worsen after 72 hours of planned treatment. Patient verbalized understanding of all instructions given/reviewed and has no further questions or concerns at this time.

## 2019-01-22 NOTE — Progress Notes (Addendum)
   Subjective:    Patient ID: Cory Houston, male    DOB: [birth year], 63 y.o.   MRN: [id number]  HPI Sumit comes to the [facility] health and wellness clinic today with complaints of postnasal drip and a cough that started 2 weeks ago.  He is followed by PCP in office for chronic care.  Blood pressure today is elevated but reports he is under stress at work. He has taken Zyrtec with little relief, and Azelatine every PM. He is not currently taking Flonase per provider's recommendation. Took [place] HBP last night with no relief. Last chart review b/p was lower. He denies any other symptoms, no SOB, wheezing, chest pain, dizziness or sore throat.    Review of Systems  Constitutional: Negative for fatigue and fever.  HENT: Positive for nosebleeds and postnasal drip. Negative for ear discharge, ear pain and sneezing.        Right nostril in distal nasal cavity  Respiratory: Positive for cough.   Cardiovascular: Negative for chest pain.       Objective:   Physical Exam Vitals signs reviewed.  Constitutional:      Appearance: Normal appearance. He is well-developed.  HENT:     Head: Normocephalic and atraumatic.     Comments: No maxillary or frontal sinus tenderness    Right Ear: Tympanic membrane and ear canal normal. There is no impacted cerumen.     Left Ear: Tympanic membrane and ear canal normal. There is no impacted cerumen.     Nose: Nose normal. No rhinorrhea.     Comments: Left nares moist with light yellow discharge sitting on inferior turbinate. Right nares with dried up blood discharge to distal medial aspect of nares.    Mouth/Throat:     Mouth: Mucous membranes are moist.     Comments: Mildly injected Neck:     Musculoskeletal: Normal range of motion and neck supple.  Cardiovascular:     Rate and Rhythm: Normal rate and regular rhythm.     Heart sounds: Normal heart sounds.  Pulmonary:     Effort: Pulmonary effort is normal. No respiratory distress.     Breath sounds:  Normal breath sounds.  Abdominal:     General: Abdomen is flat. Bowel sounds are normal.     Palpations: Abdomen is soft.  Musculoskeletal: Normal range of motion.  Lymphadenopathy:     Cervical: Cervical adenopathy present.  Skin:    General: Skin is warm and dry.  Neurological:     Mental Status: He is alert and oriented to person, place, and time.  Psychiatric:        Mood and Affect: Mood normal.           Assessment & Plan:

## 2019-02-03 ENCOUNTER — Encounter: Payer: Self-pay | Admitting: Medical

## 2019-02-03 ENCOUNTER — Ambulatory Visit: Payer: Self-pay | Admitting: Medical

## 2019-02-03 ENCOUNTER — Other Ambulatory Visit: Payer: Self-pay

## 2019-02-03 VITALS — BP 119/81 | HR 58 | Temp 97.7°F | Resp 16 | Wt 236.4 lb

## 2019-02-03 DIAGNOSIS — Z0000 Encounter for general adult medical examination without abnormal findings: Secondary | ICD-10-CM

## 2019-02-03 DIAGNOSIS — Z8679 Personal history of other diseases of the circulatory system: Secondary | ICD-10-CM

## 2019-02-03 DIAGNOSIS — Z0130 Encounter for examination of blood pressure without abnormal findings: Secondary | ICD-10-CM

## 2019-02-03 DIAGNOSIS — E559 Vitamin D deficiency, unspecified: Secondary | ICD-10-CM

## 2019-02-03 NOTE — Progress Notes (Signed)
   Subjective:    Patient ID: Cory Houston, male    DOB: 1956-04-30, 63 y.o.   MRN: [id number]  HPI 63 yo male in non acute distress her for blood pressure check. Using nasal rinses every other day. Which has helped his nasal congestion and Allegies.  Blood pressure was elevated last visit and  Provider wanted a blood pressure recheck.  Patient heard an [place] radio medical study that  taking pills at night which he feels have helped him.   Due for physical in May. Patient states he is  going to schedule colonoscopey.  Blood pressure 119/81, pulse (!) 58, temperature 97.7 F (36.5 C), temperature source Oral, resp. rate 16, weight 236 lb 6.4 oz (107.2 kg), SpO2 99 %. Allergies  Allergen Reactions  . Codeine Anaphylaxis      Review of Systems  Constitutional: Negative for chills and fever.  HENT: Negative for congestion, ear pain, rhinorrhea, sinus pressure, sinus pain, sneezing, sore throat and voice change.   Respiratory: Negative for cough and shortness of breath.   Cardiovascular: Negative for chest pain.  Musculoskeletal: Negative for myalgias.  Skin: Negative for rash.  Neurological: Negative for dizziness, syncope and light-headedness.  Psychiatric/Behavioral: Negative for behavioral problems, confusion, self-injury and suicidal ideas.       Objective:   Physical Exam Vitals signs and nursing note reviewed.  Constitutional:      Appearance: Normal appearance.  HENT:     Head: Normocephalic and atraumatic.  Eyes:     Extraocular Movements: Extraocular movements intact.     Conjunctiva/sclera: Conjunctivae normal.     Pupils: Pupils are equal, round, and reactive to light.  Pulmonary:     Effort: Pulmonary effort is normal.  Musculoskeletal: Normal range of motion.  Skin:    General: Skin is warm and dry.  Neurological:     Mental Status: He is oriented to person, place, and time.  Psychiatric:        Mood and Affect: Mood normal.        Behavior: Behavior normal.         Thought Content: Thought content normal.           Assessment & Plan:  Blood pressure recheck Hypertension history Set up a time for labs and then a physical a week later in May as discussed in clinic to day. Continue medications as prescribed. Patient verbalizes understanding and has no questions at discharge.

## 2019-02-16 ENCOUNTER — Other Ambulatory Visit: Payer: Self-pay | Admitting: Medical

## 2019-02-16 DIAGNOSIS — K219 Gastro-esophageal reflux disease without esophagitis: Secondary | ICD-10-CM

## 2019-02-16 DIAGNOSIS — I10 Essential (primary) hypertension: Secondary | ICD-10-CM

## 2019-02-19 ENCOUNTER — Other Ambulatory Visit: Payer: Self-pay | Admitting: Medical

## 2019-02-19 DIAGNOSIS — E119 Type 2 diabetes mellitus without complications: Secondary | ICD-10-CM

## 2019-03-06 ENCOUNTER — Other Ambulatory Visit: Payer: Self-pay | Admitting: Medical

## 2019-03-06 DIAGNOSIS — I10 Essential (primary) hypertension: Secondary | ICD-10-CM

## 2019-03-08 ENCOUNTER — Encounter: Payer: Self-pay | Admitting: Medical

## 2019-03-08 ENCOUNTER — Telehealth: Payer: Self-pay

## 2019-03-08 MED ORDER — LORATADINE 10 MG PO TABS: 10.0000 mg | ORAL_TABLET | Freq: Every day | ORAL | 3 refills | Status: DC

## 2019-03-08 NOTE — Telephone Encounter (Signed)
Contacted patient to review medications for refill.  We will add Claritin along with the Amlodipine. Patient will pick up prescription this afternoon.

## 2019-03-08 NOTE — Telephone Encounter (Signed)
Approved electronic Rx request for amlodipine 10mg  po daily #90 RF0.  Patient has lab draw scheduled for 04/06/2019 at 0800 and follow up appt with PA Ratcliffe at 04/20/2019.  RN Carlota Raspberry to verify with patient if any other Rx running low and let him know Rx has been sent and validate with pharmacy when ready for pick up.  Last labs 2019 renal function WNL and LFTs stable

## 2019-03-17 ENCOUNTER — Telehealth: Payer: Self-pay

## 2019-03-17 NOTE — Telephone Encounter (Signed)
      Cory Houston Male, 63 y.o., Jul 23, 1956 MRN:  [id number] Phone:  [phone] Jerilynn Mages) PCP:  [name], PA-C Coverage:  BLUE CROSS BLUE SHIELD/BCBS OTHER Next Appt With Occupational Medicine (ESW-Clinical Visit) 04/06/2019 at 8:00 AM Approved Prescriptions    amLODipine (NORVASC) 10 MG tablet           Sig: TAKE 1 TABLET BY MOUTH EVERY DAY   Disp:  90 tablet  Refills:  0   Start: 03/08/2019   Class: Normal   Authorized by: Olen Cordial, NP   Non-formulary For: Hypertension, unspecified type      To be filled at: [id number] - [place], [place] - [address]      Medication Refill   Betancourt, Aura Fey, NP routed conversation to You 9 days ago    Betancourt, Aura Fey, NP 9 days ago      Approved electronic Rx request for amlodipine 10mg  po daily #90 RF0.  Patient has lab draw scheduled for 04/06/2019 at 0800 and follow up appt with PA Ratcliffe at 04/20/2019.  RN Carlota Raspberry to verify with patient if any other Rx running low and let him know Rx has been sent and validate with pharmacy when ready for pick up.  Last labs 2019 renal function WNL and LFTs stable      Documentation     Interface, Surescripts Out  Esw-Rx Refill 11 days ago    Request received via interface.   Routing comment

## 2019-04-06 ENCOUNTER — Other Ambulatory Visit: Payer: Self-pay

## 2019-04-06 DIAGNOSIS — Z0000 Encounter for general adult medical examination without abnormal findings: Secondary | ICD-10-CM

## 2019-04-06 DIAGNOSIS — E1165 Type 2 diabetes mellitus with hyperglycemia: Secondary | ICD-10-CM

## 2019-04-06 DIAGNOSIS — E559 Vitamin D deficiency, unspecified: Secondary | ICD-10-CM

## 2019-04-07 LAB — CMP12+LP+TP+TSH+6AC+PSA+CBC…
ALT: 42 IU/L (ref 0–44)
AST: 26 IU/L (ref 0–40)
Albumin/Globulin Ratio: 1.7 (ref 1.2–2.2)
Alkaline Phosphatase: 151 IU/L — ABNORMAL HIGH (ref 39–117)
BUN/Creatinine Ratio: 18 (ref 10–24)
BUN: 17 mg/dL (ref 8–27)
Basophils Absolute: 0 10*3/uL (ref 0.0–0.2)
Basos: 0 %
Bilirubin Total: 0.5 mg/dL (ref 0.0–1.2)
Calcium: 8.6 mg/dL (ref 8.6–10.2)
Chloride: 101 mmol/L (ref 96–106)
Chol/HDL Ratio: 7.7 ratio — ABNORMAL HIGH (ref 0.0–5.0)
Cholesterol, Total: 146 mg/dL (ref 100–199)
Creatinine, Ser: 0.92 mg/dL (ref 0.76–1.27)
EOS (ABSOLUTE): 0.1 10*3/uL (ref 0.0–0.4)
Eos: 2 %
Estimated CHD Risk: 1.6 times avg. — ABNORMAL HIGH (ref 0.0–1.0)
Free Thyroxine Index: 2 (ref 1.2–4.9)
GFR calc non Af Amer: 90 mL/min/{1.73_m2} (ref >59)
GGT: 31 IU/L (ref 0–65)
Globulin, Total: 2.6 g/dL (ref 1.5–4.5)
Glucose: 248 mg/dL — ABNORMAL HIGH (ref 65–99)
HDL: 19 mg/dL — ABNORMAL LOW (ref >39)
Hematocrit: 44.6 % (ref 37.5–51.0)
Hemoglobin: 15.8 g/dL (ref 13.0–17.7)
Immature Grans (Abs): 0 10*3/uL (ref 0.0–0.1)
Immature Granulocytes: 0 %
LDH: 203 IU/L (ref 121–224)
Lymphocytes Absolute: 1.4 10*3/uL (ref 0.7–3.1)
Lymphs: 22 %
MCH: 29.4 pg (ref 26.6–33.0)
MCHC: 35.4 g/dL (ref 31.5–35.7)
MCV: 83 fL (ref 79–97)
Monocytes Absolute: 0.5 10*3/uL (ref 0.1–0.9)
Monocytes: 9 %
Neutrophils: 67 %
Phosphorus: 2.5 mg/dL — ABNORMAL LOW (ref 2.8–4.1)
Platelets: 229 10*3/uL (ref 150–450)
Potassium: 4.4 mmol/L (ref 3.5–5.2)
Prostate Specific Ag, Serum: 0.2 ng/mL (ref 0.0–4.0)
RBC: 5.38 x10E6/uL (ref 4.14–5.80)
RDW: 14 % (ref 11.6–15.4)
Sodium: 132 mmol/L — ABNORMAL LOW (ref 134–144)
T4, Total: 7.4 ug/dL (ref 4.5–12.0)
TSH: 1.78 u[IU]/mL (ref 0.450–4.500)
Triglycerides: 476 mg/dL — ABNORMAL HIGH (ref 0–149)
Uric Acid: 5.1 mg/dL (ref 3.7–8.6)
WBC: 6.3 10*3/uL (ref 3.4–10.8)

## 2019-04-07 LAB — HGB A1C W/O EAG: Hgb A1c MFr Bld: 8.9 % — ABNORMAL HIGH (ref 4.8–5.6)

## 2019-04-07 LAB — CMP12+LP+TP+TSH+6AC+PSA+CBC?
Albumin: 4.4 g/dL (ref 3.8–4.9)
GFR calc Af Amer: 104 mL/min/{1.73_m2} (ref >59)
Iron: 108 ug/dL (ref 38–169)
Neutrophils Absolute: 4.3 10*3/uL (ref 1.4–7.0)
T3 Uptake Ratio: 27 % (ref 24–39)
Total Protein: 7 g/dL (ref 6.0–8.5)

## 2019-04-07 LAB — VITAMIN D 25 HYDROXY (VIT D DEFICIENCY, FRACTURES): Vit D, 25-Hydroxy: 27.1 ng/mL — ABNORMAL LOW (ref 30.0–100.0)

## 2019-04-20 ENCOUNTER — Ambulatory Visit: Payer: Self-pay | Admitting: Medical

## 2019-04-20 ENCOUNTER — Encounter: Payer: Self-pay | Admitting: Medical

## 2019-04-20 ENCOUNTER — Other Ambulatory Visit: Payer: Self-pay

## 2019-04-20 VITALS — BP 123/80 | HR 80 | Temp 97.1°F | Resp 16 | Wt 236.4 lb

## 2019-04-20 DIAGNOSIS — E559 Vitamin D deficiency, unspecified: Secondary | ICD-10-CM

## 2019-04-20 DIAGNOSIS — E781 Pure hyperglyceridemia: Secondary | ICD-10-CM

## 2019-04-20 DIAGNOSIS — E119 Type 2 diabetes mellitus without complications: Secondary | ICD-10-CM

## 2019-04-20 DIAGNOSIS — R7301 Impaired fasting glucose: Secondary | ICD-10-CM

## 2019-04-20 DIAGNOSIS — Z0000 Encounter for general adult medical examination without abnormal findings: Secondary | ICD-10-CM

## 2019-04-20 DIAGNOSIS — R7309 Other abnormal glucose: Secondary | ICD-10-CM

## 2019-04-20 LAB — POCT URINALYSIS DIPSTICK
Bilirubin, UA: NEGATIVE
Blood, UA: NEGATIVE
Glucose, UA: POSITIVE — AB
Ketones, UA: NEGATIVE
Leukocytes, UA: NEGATIVE
Nitrite, UA: NEGATIVE
Protein, UA: POSITIVE — AB
Spec Grav, UA: 1.02 (ref 1.010–1.025)
Urobilinogen, UA: 0.2 E.U./dL
pH, UA: 6 (ref 5.0–8.0)

## 2019-04-20 NOTE — Progress Notes (Signed)
Subjective:    Patient ID: Cory Houston, male    DOB: [birth year], 63 y.o.   MRN: [id number]  HPI Patient here for annual physical and review of labs. On average worked 22 hours during [employer] leave. No compliance. Claritin and  flonase daily for allergies.   Blood pressure 123/80, pulse 80, temperature (!) 97.1 F (36.2 C), temperature source Tympanic, resp. rate 16, weight 236 lb 6.4 oz (107.2 kg), SpO2 99 %.  Allergies  Allergen Reactions  . Codeine Anaphylaxis    Current Outpatient Medications:  .  amLODipine (NORVASC) 10 MG tablet, TAKE 1 TABLET BY MOUTH EVERY DAY, Disp: 90 tablet, Rfl: 0 .  Azelastine HCl 137 MCG/SPRAY SOLN, Place 1 spray into the nose 2 (two) times daily., Disp: 1 Bottle, Rfl: 0 .  Cholecalciferol (VITAMIN D-3) 5000 units TABS, Take 1 tablet by mouth every other day. , Disp: , Rfl:  .  fluticasone (FLONASE) 50 MCG/ACT nasal spray, Place 2 sprays into both nostrils daily., Disp: 16 g, Rfl: 6 .  loratadine (CLARITIN) 10 MG tablet, Take 1 tablet (10 mg total) by mouth daily., Disp: 90 tablet, Rfl: 3 .  metFORMIN (GLUCOPHAGE-XR) 500 MG 24 hr tablet, TAKE 1 TABLET BY MOUTH TWICE A DAY, Disp: 180 tablet, Rfl: 1 .  Multiple Vitamin (MULTIVITAMIN) tablet, Take 1 tablet by mouth daily., Disp: , Rfl:  .  Omega-3 Fatty Acids (FISH OIL) 1200 MG CAPS, Take 2,400 mg by mouth daily after breakfast. 1200 mg in the evening after supper, Disp: , Rfl:  .  omeprazole (PRILOSEC) 20 MG capsule, TAKE 1 CAPSULE BY MOUTH EVERY DAY, Disp: 90 capsule, Rfl: 0 .  ramipril (ALTACE) 5 MG capsule, TAKE 1 CAPSULE BY MOUTH DAILY PLEASE CALL OFFICE TO SCHEDULE LABS NOW & BLOOD PRESSURE RECHECK AFTER, Disp: 90 capsule, Rfl: 0 Review of Systems  Allergic/Immunologic: Positive for environmental allergies.  All other systems reviewed and are negative. Dribbling  afater on urination some of the time . "I just wait a little longer and I then urinate a little more" :if I don't wait I will  leak on my underwear". Has put on  5 lbs.   Colonoscopy needed was working on scheduling and then pandemic Covid-19 occurred.  Eye exam  6 months ago  normal eyes per patient he says he got a diabetic exam, he  got new glasses.   Recent Results (from the past 2160 hour(s))  VITAMIN D 25 Hydroxy (Vit-D Deficiency, Fractures)     Status: Abnormal   Collection Time: 04/06/19  8:09 AM  Result Value Ref Range   Vit D, 25-Hydroxy 27.1 (L) 30.0 - 100.0 ng/mL    Comment: Vitamin D deficiency has been defined by the [place] practice guideline as a level of serum 25-OH vitamin D less than 20 ng/mL (1,2). The Endocrine Society went on to further define vitamin D insufficiency as a level between 21 and 29 ng/mL (2). 1. IOM (Institute of Medicine). 2010. Dietary reference    intakes for calcium and D. [place]: The    [employer]. 2. Holick MF, [place], Bischoff-Ferrari HA, et al.    Evaluation, treatment, and prevention of vitamin D    deficiency: an Endocrine Society clinical practice    guideline. JCEM. 2011 Jul; 96(7):1911-30.   CMP12+LP+TP+TSH+6AC+PSA+CBC.     Status: Abnormal   Collection Time: 04/06/19  8:09 AM  Result Value Ref Range   Glucose 248 (H) 65 -  99 mg/dL   Uric Acid 5.1 3.7 - 8.6 mg/dL    Comment:            Therapeutic target for gout patients: <6.0   BUN 17 8 - 27 mg/dL   Creatinine, Ser 0.92 0.76 - 1.27 mg/dL   GFR calc non Af Amer 90 >59 mL/min/1.73   GFR calc Af Amer 104 >59 mL/min/1.73   BUN/Creatinine Ratio 18 10 - 24   Sodium 132 (L) 134 - 144 mmol/L   Potassium 4.4 3.5 - 5.2 mmol/L   Chloride 101 96 - 106 mmol/L   Calcium 8.6 8.6 - 10.2 mg/dL   Phosphorus 2.5 (L) 2.8 - 4.1 mg/dL   Total Protein 7.0 6.0 - 8.5 g/dL   Albumin 4.4 3.8 - 4.9 g/dL   Globulin, Total 2.6 1.5 - 4.5 g/dL   Albumin/Globulin Ratio 1.7 1.2 - 2.2   Bilirubin Total 0.5 0.0 - 1.2 mg/dL   Alkaline Phosphatase 151 (H) 39 - 117 IU/L    LDH 203 121 - 224 IU/L   AST 26 0 - 40 IU/L   ALT 42 0 - 44 IU/L   GGT 31 0 - 65 IU/L   Iron 108 38 - 169 ug/dL   Cholesterol, Total 146 100 - 199 mg/dL   Triglycerides 476 (H) 0 - 149 mg/dL   HDL 19 (L) >39 mg/dL   VLDL Cholesterol Cal Comment 5 - 40 mg/dL    Comment: The calculation for the VLDL cholesterol is not valid when triglyceride level is >400 mg/dL.    LDL Calculated Comment 0 - 99 mg/dL    Comment: Triglyceride result indicated is too high for an accurate LDL cholesterol estimation.    Chol/HDL Ratio 7.7 (H) 0.0 - 5.0 ratio    Comment:                                   T. Chol/HDL Ratio                                             Men  Women                               1/2 Avg.Risk  3.4    3.3                                   Avg.Risk  5.0    4.4                                2X Avg.Risk  9.6    7.1                                3X Avg.Risk 23.4   11.0    Estimated CHD Risk 1.6 (H) 0.0 - 1.0 times avg.    Comment:                                   T. Chol/HDL Ratio  Men  Women                               1/2 Avg.Risk  3.4    3.3                                   Avg.Risk  5.0    4.4                                2X Avg.Risk  9.6    7.1                                3X Avg.Risk 23.4   11.0 The CHD Risk is based on the T. Chol/HDL ratio.  Other factors affect CHD Risk such as hypertension, smoking, diabetes, severe obesity, and family history of pre- mature CHD.    TSH 1.780 0.450 - 4.500 uIU/mL   T4, Total 7.4 4.5 - 12.0 ug/dL   T3 Uptake Ratio 27 24 - 39 %   Free Thyroxine Index 2.0 1.2 - 4.9   Prostate Specific Ag, Serum 0.2 0.0 - 4.0 ng/mL    Comment: Roche ECLIA methodology. According to the American Urological Association, Serum PSA should decrease and remain at undetectable levels after radical prostatectomy. The AUA defines biochemical recurrence as an initial PSA value 0.2 ng/mL or greater followed  by a subsequent confirmatory PSA value 0.2 ng/mL or greater. Values obtained with different assay methods or kits cannot be used interchangeably. Results cannot be interpreted as absolute evidence of the presence or absence of malignant disease.    WBC 6.3 3.4 - 10.8 x10E3/uL   RBC 5.38 4.14 - 5.80 x10E6/uL   Hemoglobin 15.8 13.0 - 17.7 g/dL   Hematocrit 44.6 37.5 - 51.0 %   MCV 83 79 - 97 fL   MCH 29.4 26.6 - 33.0 pg   MCHC 35.4 31.5 - 35.7 g/dL   RDW 14.0 11.6 - 15.4 %   Platelets 229 150 - 450 x10E3/uL   Neutrophils 67 Not Estab. %   Lymphs 22 Not Estab. %   Monocytes 9 Not Estab. %   Eos 2 Not Estab. %   Basos 0 Not Estab. %   Neutrophils Absolute 4.3 1.4 - 7.0 x10E3/uL   Lymphocytes Absolute 1.4 0.7 - 3.1 x10E3/uL   Monocytes Absolute 0.5 0.1 - 0.9 x10E3/uL   EOS (ABSOLUTE) 0.1 0.0 - 0.4 x10E3/uL   Basophils Absolute 0.0 0.0 - 0.2 x10E3/uL   Immature Granulocytes 0 Not Estab. %   Immature Grans (Abs) 0.0 0.0 - 0.1 x10E3/uL  Hgb A1c w/o eAG     Status: Abnormal   Collection Time: 04/06/19  8:09 AM  Result Value Ref Range   Hgb A1c MFr Bld 8.9 (H) 4.8 - 5.6 %    Comment:          Prediabetes: 5.7 - 6.4          Diabetes: >6.4          Glycemic control for adults with diabetes: <7.0   Urinalysis, Routine w reflex microscopic     Status: Abnormal   Collection Time: 04/20/19  9:07 AM  Result Value Ref Range   Specific Gravity, UA 1.023 1.005 - 1.030  pH, UA 5.0 5.0 - 7.5   Color, UA Yellow Yellow   Appearance Ur Clear Clear   Leukocytes,UA Negative Negative   Protein,UA Negative Negative/Trace   Glucose, UA 3+ (A) Negative   Ketones, UA Trace (A) Negative   RBC, UA Negative Negative   Bilirubin, UA Negative Negative   Urobilinogen, Ur 0.2 0.2 - 1.0 mg/dL   Nitrite, UA Negative Negative   Microscopic Examination Comment     Comment: Microscopic not indicated and not performed.  POCT urinalysis dipstick     Status: Abnormal   Collection Time: 04/20/19  9:34 AM   Result Value Ref Range   Color, UA straw    Clarity, UA clear    Glucose, UA Positive (A) Negative    Comment: 3+   Bilirubin, UA neg    Ketones, UA neg    Spec Grav, UA 1.020 1.010 - 1.025   Blood, UA neg    pH, UA 6.0 5.0 - 8.0   Protein, UA Positive (A) Negative    Comment: trace   Urobilinogen, UA 0.2 0.2 or 1.0 E.U./dL   Nitrite, UA neg    Leukocytes, UA Negative Negative   Appearance     Odor     Objective:   Physical Exam Vitals signs and nursing note reviewed. Exam conducted with a chaperone present.  Constitutional:      Appearance: Normal appearance. He is obese.     Interventions: Face mask in place.  HENT:     Head: Normocephalic and atraumatic.     Jaw: There is normal jaw occlusion.     Salivary Glands: Right salivary gland is not diffusely enlarged. Left salivary gland is not diffusely enlarged.     Right Ear: Ear canal and external ear normal.     Left Ear: Tympanic membrane, ear canal and external ear normal.     Nose: Nose normal.     Mouth/Throat:     Mouth: Mucous membranes are dry.  Eyes:     General: Lids are normal.     Extraocular Movements: Extraocular movements intact.     Conjunctiva/sclera: Conjunctivae normal.     Pupils: Pupils are equal, round, and reactive to light.  Neck:     Musculoskeletal: Normal range of motion and neck supple. No neck rigidity or muscular tenderness.  Cardiovascular:     Rate and Rhythm: Normal rate and regular rhythm.     Pulses: Normal pulses.          Carotid pulses are 2+ on the right side and 2+ on the left side.      Radial pulses are 2+ on the right side and 2+ on the left side.       Femoral pulses are 2+ on the right side and 2+ on the left side.      Dorsalis pedis pulses are 2+ on the right side and 2+ on the left side.       Posterior tibial pulses are 2+ on the right side and 2+ on the left side.     Heart sounds: Normal heart sounds.  Pulmonary:     Effort: Pulmonary effort is normal.     Breath  sounds: Normal breath sounds.  Chest:     Chest wall: No mass, deformity or tenderness.     Breasts: Breasts are symmetrical.        Right: Normal.        Left: Normal.  Abdominal:     General: Bowel sounds  are normal.     Palpations: Abdomen is soft.     Tenderness: There is no abdominal tenderness. There is no right CVA tenderness or left CVA tenderness.     Hernia: A hernia is present.     Comments: Noted ventral hernia superior to umbilicus  Genitourinary:    Pubic Area: No pubic lice.      Penis: Normal and circumcised.      Scrotum/Testes: Normal.     Epididymis:     Right: Normal.     Left: Normal.  Musculoskeletal: Normal range of motion.     Right shoulder: Normal.     Left shoulder: Normal.     Right elbow: Normal.    Left elbow: Normal.     Left wrist: Normal.     Right knee: Normal.     Left knee: Normal.     Right ankle: Normal.     Left ankle: Normal.     Cervical back: Normal.     Thoracic back: Normal.     Lumbar back: Normal.     Right upper arm: Normal.     Left upper arm: Normal.     Right forearm: Normal.     Right hand: Normal.     Left hand: Normal.     Left upper leg: Normal.     Right lower leg: Normal.     Left lower leg: Normal.     Right foot: Normal. Normal range of motion. No deformity, bunion, Charcot foot or foot drop.     Left foot: Normal range of motion. No deformity, bunion, Charcot foot or foot drop.  Feet:     Right foot:     Skin integrity: Skin integrity normal. No ulcer, blister, skin breakdown, erythema, warmth, callus, dry skin or fissure.     Toenail Condition: Fungal disease present.    Left foot:     Skin integrity: Skin integrity normal. No ulcer, blister, skin breakdown, erythema, warmth, callus, dry skin or fissure.     Toenail Condition: Fungal disease present.    Comments: Mild Decreased hair noted on ankle areas bilaterally Lymphadenopathy:     Head:     Right side of head: No submental, submandibular, tonsillar,  preauricular, posterior auricular or occipital adenopathy.     Left side of head: No submental, submandibular, tonsillar, preauricular, posterior auricular or occipital adenopathy.     Cervical: No cervical adenopathy.     Right cervical: No superficial, deep or posterior cervical adenopathy.    Left cervical: No superficial, deep or posterior cervical adenopathy.     Upper Body:     Right upper body: No supraclavicular, axillary or pectoral adenopathy.     Left upper body: No supraclavicular, axillary or pectoral adenopathy.     Lower Body: No right inguinal adenopathy. No left inguinal adenopathy.  Skin:    General: Skin is warm and dry.     Capillary Refill: Capillary refill takes less than 2 seconds.  Neurological:     General: No focal deficit present.     Mental Status: He is alert and oriented to person, place, and time.     GCS: GCS eye subscore is 4. GCS verbal subscore is 5. GCS motor subscore is 6.     Cranial Nerves: Cranial nerves are intact.     Sensory: Sensation is intact.     Motor: Motor function is intact.     Coordination: Coordination is intact.     Deep Tendon Reflexes:  Reflex Scores:      Achilles reflexes are 2+ on the right side and 2+ on the left side. Psychiatric:        Attention and Perception: Attention and perception normal.        Mood and Affect: Mood normal.        Speech: Speech normal.        Behavior: Behavior normal.        Thought Content: Thought content normal.        Cognition and Memory: Cognition and memory normal.        Judgment: Judgment normal.    guiaic test negative   Rectal exam good sphincer tone Prostate non tender and smooth. Assessment & Plan:  Routine annual exam and review of labs with patient. Health maintenance, Has changed diet since  5 days ago he knows he has not made good choices in the last 3 months.. Elevated fasting glucose and elevate A1C Elevated Triglycerides History of Hypertension well controlled. On  Fish Oil 3000mg  a day. To increase vitamin D3 2000IU/daily Discussed with patient on changing his diabetic medication .He is firm that he does not want to try a new medication and he does not want to increase his current medication. He is aware of the risks/signs/symptoms of worsening diabetes. Enocuraged/ reviewed the importance of diet and exercise. Patient verbalizes understanding plan and has no questions at discharge.He also does not want to see urology about urine dribbling at this time. Given Pneumoccal 23 vaccine. Referral made to GI for colonoscopy.  PSA not done with annual labs will do PSA with labs in  3 months. 3 month follow up with labs.  Lab Orders     Urinalysis, Routine w reflex microscopic     Comprehensive metabolic panel     Lipid panel     VITAMIN D 25 Hydroxy (Vit-D Deficiency, Fractures)     Hgb A1c w/o eAG     PSA     POCT urinalysis dipstick  MyChart message to see if he needs any refills.

## 2019-04-20 NOTE — Patient Instructions (Addendum)
Diabetes Mellitus and Nutrition, Adult When you have diabetes (diabetes mellitus), it is very important to have healthy eating habits because your blood sugar (glucose) levels are greatly affected by what you eat and drink. Eating healthy foods in the appropriate amounts, at about the same times every day, can help you:  Control your blood glucose.  Lower your risk of heart disease.  Improve your blood pressure.  Reach or maintain a healthy weight. Every person with diabetes is different, and each person has different needs for a meal plan. Your health care provider may recommend that you work with a diet and nutrition specialist (dietitian) to make a meal plan that is best for you. Your meal plan may vary depending on factors such as:  The calories you need.  The medicines you take.  Your weight.  Your blood glucose, blood pressure, and cholesterol levels.  Your activity level.  Other health conditions you have, such as heart or kidney disease. How do carbohydrates affect me? Carbohydrates, also called carbs, affect your blood glucose level more than any other type of food. Eating carbs naturally raises the amount of glucose in your blood. Carb counting is a method for keeping track of how many carbs you eat. Counting carbs is important to keep your blood glucose at a healthy level, especially if you use insulin or take certain oral diabetes medicines. It is important to know how many carbs you can safely have in each meal. This is different for every person. Your dietitian can help you calculate how many carbs you should have at each meal and for each snack. Foods that contain carbs include:  Bread, cereal, rice, pasta, and crackers.  Potatoes and corn.  Peas, beans, and lentils.  Milk and yogurt.  Fruit and juice.  Desserts, such as cakes, cookies, ice cream, and candy. How does alcohol affect me? Alcohol can cause a sudden decrease in blood glucose (hypoglycemia),  especially if you use insulin or take certain oral diabetes medicines. Hypoglycemia can be a life-threatening condition. Symptoms of hypoglycemia (sleepiness, dizziness, and confusion) are similar to symptoms of having too much alcohol. If your health care provider says that alcohol is safe for you, follow these guidelines:  Limit alcohol intake to no more than 1 drink per day for nonpregnant women and 2 drinks per day for men. One drink equals 12 oz of beer, 5 oz of wine, or 1 oz of hard liquor.  Do not drink on an empty stomach.  Keep yourself hydrated with water, diet soda, or unsweetened iced tea.  Keep in mind that regular soda, juice, and other mixers may contain a lot of sugar and must be counted as carbs. What are tips for following this plan?  Reading food labels  Start by checking the serving size on the "Nutrition Facts" label of packaged foods and drinks. The amount of calories, carbs, fats, and other nutrients listed on the label is based on one serving of the item. Many items contain more than one serving per package.  Check the total grams (g) of carbs in one serving. You can calculate the number of servings of carbs in one serving by dividing the total carbs by 15. For example, if a food has 30 g of total carbs, it would be equal to 2 servings of carbs.  Check the number of grams (g) of saturated and trans fats in one serving. Choose foods that have low or no amount of these fats.  Check the number of   milligrams (mg) of salt (sodium) in one serving. Most people should limit total sodium intake to less than 2,300 mg per day.  Always check the nutrition information of foods labeled as "low-fat" or "nonfat". These foods may be higher in added sugar or refined carbs and should be avoided.  Talk to your dietitian to identify your daily goals for nutrients listed on the label. Shopping  Avoid buying canned, premade, or processed foods. These foods tend to be high in fat, sodium,  and added sugar.  Shop around the outside edge of the grocery store. This includes fresh fruits and vegetables, bulk grains, fresh meats, and fresh dairy. Cooking  Use low-heat cooking methods, such as baking, instead of high-heat cooking methods like deep frying.  Cook using healthy oils, such as olive, canola, or sunflower oil.  Avoid cooking with butter, cream, or high-fat meats. Meal planning  Eat meals and snacks regularly, preferably at the same times every day. Avoid going long periods of time without eating.  Eat foods high in fiber, such as fresh fruits, vegetables, beans, and whole grains. Talk to your dietitian about how many servings of carbs you can eat at each meal.  Eat 4-6 ounces (oz) of lean protein each day, such as lean meat, chicken, fish, eggs, or tofu. One oz of lean protein is equal to: ? 1 oz of meat, chicken, or fish. ? 1 egg. ?  cup of tofu.  Eat some foods each day that contain healthy fats, such as avocado, nuts, seeds, and fish. Lifestyle  Check your blood glucose regularly.  Exercise regularly as told by your health care provider. This may include: ? 150 minutes of moderate-intensity or vigorous-intensity exercise each week. This could be brisk walking, biking, or water aerobics. ? Stretching and doing strength exercises, such as yoga or weightlifting, at least 2 times a week.  Take medicines as told by your health care provider.  Do not use any products that contain nicotine or tobacco, such as cigarettes and e-cigarettes. If you need help quitting, ask your health care provider.  Work with a Social worker or diabetes educator to identify strategies to manage stress and any emotional and social challenges. Questions to ask a health care provider  Do I need to meet with a diabetes educator?  Do I need to meet with a dietitian?  What number can I call if I have questions?  When are the best times to check my blood glucose? Where to find more  information:  American Diabetes Association: diabetes.org  Academy of Nutrition and Dietetics: www.eatright.[employer] of Diabetes and Digestive and Kidney Diseases (NIH): [employer] Summary  A healthy meal plan will help you control your blood glucose and maintain a healthy lifestyle.  Working with a diet and nutrition specialist (dietitian) can help you make a meal plan that is best for you.  Keep in mind that carbohydrates (carbs) and alcohol have immediate effects on your blood glucose levels. It is important to count carbs and to use alcohol carefully. This information is not intended to replace advice given to you by your health care provider. Make sure you discuss any questions you have with your health care provider. Document Released: 08/01/2005 Document Revised: 06/04/2017 Document Reviewed: 12/09/2016 Elsevier Interactive Patient Education  2019 [employer].  Testicular Self-Exam A self-exam of your testicles (testicular self-exam) is looking at and feeling your testicles for unusual lumps or swelling. Swelling, lumps, or pain can be caused by:  Injuries.  Puffiness, redness,  and soreness (inflammation).  Infection.  Extra fluids around your testicle (hydrocele).  Twisted testicles (testicular torsion).  Cancer of the testicle (testicular cancer). Why is it important to do a self-exam of testicles? You may need to do self-exams if you are at risk for cancer of the testicles. You may be at risk if you have:  A testicle that has not descended (cryptorchidism).  A history of cancer of the testicle.  A family history of cancer of the testicle. How to do a self-exam of testicles It is easiest to do a self-exam after a warm bath or shower. Testicles are harder to examine when you are cold. A normal testicle is egg-shaped and feels firm. It is smooth, and it is not tender. At the back of your testicles, there is a firm cord that feels like spaghetti  (spermatic cord). Look and feel for changes  Stand and hold your penis away from your body.  Look at each testicle to check for lumps or swelling.  Roll each testicle between your thumb and finger. Feel the whole testicle. Feel for: ? Lumps. ? Swelling. ? Discomfort.  Check for swelling or tender bumps in the groin area. Your groin is where your lower belly (abdomen) meets your upper thighs. Contact a health care provider if:  You find a bump or lump. This may be like a small, hard bump that is the size of a pea.  You find swelling.  You find pain.  You find soreness.  You see or feel any other changes. Summary  A self-exam of your testicles is looking at and feeling your testicles for lumps or swelling.  You may need to do self-exams if you are at risk for cancer of the testicle.  You should check each of your testicles for lumps, swelling, or discomfort.  You should check for swelling or tender bumps in the groin area. Your groin is where your lower belly (abdomen) meets your upper thighs. This information is not intended to replace advice given to you by your health care provider. Make sure you discuss any questions you have with your health care provider. Document Released: 01/31/2009 Document Revised: 09/30/2016 Document Reviewed: 09/30/2016 Elsevier Interactive Patient Education  2019 [employer].

## 2019-04-21 ENCOUNTER — Encounter: Payer: Self-pay | Admitting: Medical

## 2019-04-21 LAB — URINALYSIS, ROUTINE W REFLEX MICROSCOPIC
Bilirubin, UA: NEGATIVE
Leukocytes,UA: NEGATIVE
Nitrite, UA: NEGATIVE
Protein,UA: NEGATIVE
RBC, UA: NEGATIVE
Specific Gravity, UA: 1.023 (ref 1.005–1.030)
Urobilinogen, Ur: 0.2 mg/dL (ref 0.2–1.0)
pH, UA: 5 (ref 5.0–7.5)

## 2019-04-28 ENCOUNTER — Telehealth: Payer: Self-pay | Admitting: Gastroenterology

## 2019-04-28 NOTE — Telephone Encounter (Signed)
Pt left vm to schedule a colonoscopy for July please call pt

## 2019-04-29 NOTE — Telephone Encounter (Signed)
Returned patients call.  LVM for him to call office back to schedule.  Thanks [employer]

## 2019-05-03 ENCOUNTER — Encounter: Payer: Self-pay | Admitting: *Deleted

## 2019-05-11 ENCOUNTER — Other Ambulatory Visit: Payer: Self-pay | Admitting: Medical

## 2019-05-11 DIAGNOSIS — K219 Gastro-esophageal reflux disease without esophagitis: Secondary | ICD-10-CM

## 2019-05-11 DIAGNOSIS — I10 Essential (primary) hypertension: Secondary | ICD-10-CM

## 2019-05-17 ENCOUNTER — Other Ambulatory Visit: Payer: Self-pay | Admitting: *Deleted

## 2019-05-17 ENCOUNTER — Encounter: Payer: Self-pay | Admitting: *Deleted

## 2019-05-17 ENCOUNTER — Other Ambulatory Visit: Payer: Self-pay | Admitting: Medical

## 2019-05-17 DIAGNOSIS — Z760 Encounter for issue of repeat prescription: Secondary | ICD-10-CM

## 2019-05-17 DIAGNOSIS — I10 Essential (primary) hypertension: Secondary | ICD-10-CM

## 2019-05-17 DIAGNOSIS — R21 Rash and other nonspecific skin eruption: Secondary | ICD-10-CM

## 2019-05-17 MED ORDER — TRIAMCINOLONE ACETONIDE 0.1 % EX CREA: 1.0000 "application " | TOPICAL_CREAM | Freq: Two times a day (BID) | CUTANEOUS | 0 refills | Status: DC

## 2019-05-17 MED ORDER — AMLODIPINE BESYLATE 10 MG PO TABS: 10.0000 mg | ORAL_TABLET | Freq: Every day | ORAL | 0 refills | Status: DC

## 2019-05-17 NOTE — Progress Notes (Signed)
Patient needs refill on Triamcinolone cream 1% for rash on lower legs. Similar to rash he has last summer.  Walking a lot at work.  To return to the clinic if not improving in 7 days with treatment. Patient verbilazes understanding and has no further questions at this time.  Meds ordered this encounter  Medications  . amLODipine (NORVASC) 10 MG tablet    Sig: Take 1 tablet (10 mg total) by mouth daily.    Dispense:  90 tablet    Refill:  0  . triamcinolone cream (KENALOG) 0.1 %    Sig: Apply 1 application topically 2 (two) times daily. To affected area.    Dispense:  30 g    Refill:  0

## 2019-07-27 ENCOUNTER — Other Ambulatory Visit: Payer: Self-pay

## 2019-07-27 ENCOUNTER — Ambulatory Visit: Payer: Self-pay | Admitting: Medical

## 2019-07-27 ENCOUNTER — Encounter: Payer: Self-pay | Admitting: Medical

## 2019-07-27 VITALS — BP 138/74 | HR 64 | Temp 97.3°F | Resp 16 | Wt 231.4 lb

## 2019-07-27 DIAGNOSIS — Z8679 Personal history of other diseases of the circulatory system: Secondary | ICD-10-CM

## 2019-07-27 DIAGNOSIS — E781 Pure hyperglyceridemia: Secondary | ICD-10-CM

## 2019-07-27 DIAGNOSIS — Z760 Encounter for issue of repeat prescription: Secondary | ICD-10-CM

## 2019-07-27 DIAGNOSIS — R7301 Impaired fasting glucose: Secondary | ICD-10-CM

## 2019-07-27 DIAGNOSIS — Z8719 Personal history of other diseases of the digestive system: Secondary | ICD-10-CM

## 2019-07-27 DIAGNOSIS — E119 Type 2 diabetes mellitus without complications: Secondary | ICD-10-CM

## 2019-07-27 DIAGNOSIS — E559 Vitamin D deficiency, unspecified: Secondary | ICD-10-CM

## 2019-07-27 DIAGNOSIS — Z0000 Encounter for general adult medical examination without abnormal findings: Secondary | ICD-10-CM

## 2019-07-27 LAB — POCT URINALYSIS DIPSTICK
Bilirubin, UA: NEGATIVE
Blood, UA: NEGATIVE
Glucose, UA: NEGATIVE
Ketones, UA: NEGATIVE
Leukocytes, UA: NEGATIVE
Nitrite, UA: NEGATIVE
Protein, UA: NEGATIVE
Spec Grav, UA: 1.015 (ref 1.010–1.025)
Urobilinogen, UA: 0.2 E.U./dL
pH, UA: 7 (ref 5.0–8.0)

## 2019-07-27 MED ORDER — METFORMIN HCL ER 500 MG PO TB24: 500.0000 mg | ORAL_TABLET | Freq: Two times a day (BID) | ORAL | 1 refills | Status: DC

## 2019-07-27 NOTE — Progress Notes (Signed)
Subjective:    Patient ID: Cory Houston, male    DOB: Jan 21, 1956, 63 y.o.   MRN: [id number]  HPI 63 yo male in non acute distress here for 3 month follow up. Repeat UA dip, sent to [facility] and w/ microalbumin Glucose numbers are 120-130 in the mornings. Taking Fish Oil daily and taking care with his dietery intake..  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 Blew nose and a clot about the size of his pinky.He would like to follow up with ENT  His Ventral hernia , seems bigger especially with sitting. Some discomfort afterwards when he ate too much.   Blood pressure 138/74, pulse 64, temperature (!) 97.3 F (36.3 C), temperature source Tympanic, resp. rate 16, weight 231 lb 6.4 oz (105 kg), SpO2 100 %.  Allergies  Allergen Reactions  . Codeine Anaphylaxis    Current Outpatient Medications:  .  amLODipine (NORVASC) 10 MG tablet, Take 1 tablet (10 mg total) by mouth daily., Disp: 90 tablet, Rfl: 0 .  Cholecalciferol (VITAMIN D-3) 5000 units TABS, Take 1 tablet by mouth every other day. , Disp: , Rfl:  .  fluticasone (FLONASE) 50 MCG/ACT nasal spray, Place 2 sprays into both nostrils daily., Disp: 16 g, Rfl: 6 .  loratadine (CLARITIN) 10 MG tablet, Take 1 tablet (10 mg total) by mouth daily., Disp: 90 tablet, Rfl: 3 .  Multiple Vitamin (MULTIVITAMIN) tablet, Take 1 tablet by mouth daily., Disp: , Rfl:  .  Omega-3 Fatty Acids (FISH OIL) 1200 MG CAPS, Take 2,400 mg by mouth daily after breakfast. 1200 mg in the evening after supper, Disp: , Rfl:  .  omeprazole (PRILOSEC) 20 MG capsule, TAKE 1 CAPSULE BY MOUTH EVERY DAY, Disp: 90 capsule, Rfl: 0 .  ramipril (ALTACE) 5 MG capsule, TAKE 1 CAPSULE BY MOUTH DAILY PLEASE CALL OFFICE TO SCHEDULE LABS NOW & BLOOD PRESSURE RECHECK AFTER, Disp: 90 capsule, Rfl: 0 .  triamcinolone cream (KENALOG) 0.1 %, Apply 1 application topically 2 (two) times daily. To affected area., Disp: 30 g, Rfl: 0 .  Azelastine HCl 137 MCG/SPRAY SOLN, Place 1 spray  into the nose 2 (two) times daily. (Patient not taking: Reported on 07/27/2019), Disp: 1 Bottle, Rfl: 0 .  metFORMIN (GLUCOPHAGE-XR) 500 MG 24 hr tablet, Take 1 tablet (500 mg total) by mouth 2 (two) times daily., Disp: 180 tablet, Rfl: 1  Review of Systems  Constitutional: Negative for chills, fatigue and fever.  HENT: Negative for congestion, ear pain and sore throat.        Blood nose in the shower and a clot came out , he feels he can breath better now.  Respiratory: Negative for cough and shortness of breath.   Cardiovascular: Negative for chest pain and leg swelling.  Gastrointestinal: Positive for abdominal pain (after eating large meals.).  Genitourinary: Negative for dysuria.  Musculoskeletal: Negative for myalgias.  Neurological: Negative for dizziness, syncope and headaches.  Hematological: Negative for adenopathy.   Right knee arthroscopic 80% of meniscus removed pain on the sides with sitting long periods. Colonoscopy depends on the amount he has to pay. CPAP wearing that nightly.  Loss of  5lbs since last visit.  Ankle rash is gone, occasionally uses triamcinolone cream.    Objective:   Physical Exam Vitals signs and nursing note reviewed.  Constitutional:      Appearance: Normal appearance. He is obese.  HENT:     Head: Normocephalic and atraumatic.  Eyes:     Extraocular  Movements: Extraocular movements intact.     Conjunctiva/sclera: Conjunctivae normal.     Pupils: Pupils are equal, round, and reactive to light.  Neck:     Musculoskeletal: Normal range of motion.  Cardiovascular:     Rate and Rhythm: Normal rate and regular rhythm.  Abdominal:     Palpations: Abdomen is soft.     Hernia: A hernia (larage ventral  upper abdomen soft non tender) is present.  Skin:    General: Skin is warm and dry.  Neurological:     General: No focal deficit present.     Mental Status: He is alert and oriented to person, place, and time.  Psychiatric:        Mood and  Affect: Mood normal.        Thought Content: Thought content normal.        Judgment: Judgment normal.    Ventral upper abdomen hernia, soft, non tender.        Assessment & Plan:  Medication refill  H/O Hypertension,  DM type 2  Meds ordered this encounter  Medications  . metFORMIN (GLUCOPHAGE-XR) 500 MG 24 hr tablet    Sig: Take 1 tablet (500 mg total) by mouth 2 (two) times daily.    Dispense:  180 tablet    Refill:  1  Sleep Apnea, utilizing his sleep apnea machine nightly. H/O Ventral hernia, changing per patient to follow up with Gastroenterology they have attempted to contact him but he has not contacted them back about a colonoscopy.  Pending labs  CMP, Lipid panel A1C, UA and microalbumin He has lost  5lbs since last visit. ENT call for follow up on nose and recheck of his deviated septum .  He verbalizes understanding and has no questions at discharge. Will call patient with lab results and review.  Follow up in 6 months if labs are within normal ranges.

## 2019-07-28 LAB — COMPREHENSIVE METABOLIC PANEL
ALT: 59 IU/L — ABNORMAL HIGH (ref 0–44)
AST: 32 IU/L (ref 0–40)
Albumin/Globulin Ratio: 1.9 (ref 1.2–2.2)
Albumin: 4.6 g/dL (ref 3.8–4.9)
Alkaline Phosphatase: 129 IU/L — ABNORMAL HIGH (ref 39–117)
BUN/Creatinine Ratio: 14 (ref 10–24)
BUN: 13 mg/dL (ref 8–27)
Bilirubin Total: 0.5 mg/dL (ref 0.0–1.2)
CO2: 22 mmol/L (ref 20–29)
Calcium: 9 mg/dL (ref 8.6–10.2)
Chloride: 98 mmol/L (ref 96–106)
Creatinine, Ser: 0.96 mg/dL (ref 0.76–1.27)
GFR calc Af Amer: 99 mL/min/{1.73_m2} (ref >59)
GFR calc non Af Amer: 86 mL/min/{1.73_m2} (ref >59)
Globulin, Total: 2.4 g/dL (ref 1.5–4.5)
Glucose: 172 mg/dL — ABNORMAL HIGH (ref 65–99)
Potassium: 4.3 mmol/L (ref 3.5–5.2)
Sodium: 137 mmol/L (ref 134–144)
Total Protein: 7 g/dL (ref 6.0–8.5)

## 2019-07-28 LAB — HGB A1C W/O EAG: Hgb A1c MFr Bld: 8 % — ABNORMAL HIGH (ref 4.8–5.6)

## 2019-07-28 LAB — URINALYSIS, ROUTINE W REFLEX MICROSCOPIC
Bilirubin, UA: NEGATIVE
Glucose, UA: NEGATIVE
Ketones, UA: NEGATIVE
Leukocytes,UA: NEGATIVE
Nitrite, UA: NEGATIVE
Protein,UA: NEGATIVE
RBC, UA: NEGATIVE
Specific Gravity, UA: 1.02 (ref 1.005–1.030)
Urobilinogen, Ur: 1 mg/dL (ref 0.2–1.0)
pH, UA: 6.5 (ref 5.0–7.5)

## 2019-07-28 LAB — LIPID PANEL
Chol/HDL Ratio: 7.3 ratio — ABNORMAL HIGH (ref 0.0–5.0)
Cholesterol, Total: 174 mg/dL (ref 100–199)
HDL: 24 mg/dL — ABNORMAL LOW (ref >39)
LDL Chol Calc (NIH): 105 mg/dL — ABNORMAL HIGH (ref 0–99)
Triglycerides: 259 mg/dL — ABNORMAL HIGH (ref 0–149)
VLDL Cholesterol Cal: 45 mg/dL — ABNORMAL HIGH (ref 5–40)

## 2019-07-28 LAB — MICROALBUMIN / CREATININE URINE RATIO
Creatinine, Urine: 102.3 mg/dL
Microalb/Creat Ratio: 6 mg/g creat (ref 0–29)
Microalbumin, Urine: 6.3 ug/mL

## 2019-07-28 LAB — VITAMIN D 25 HYDROXY (VIT D DEFICIENCY, FRACTURES): Vit D, 25-Hydroxy: 55 ng/mL (ref 30.0–100.0)

## 2019-08-02 ENCOUNTER — Other Ambulatory Visit: Payer: Self-pay | Admitting: Medical

## 2019-08-02 DIAGNOSIS — K219 Gastro-esophageal reflux disease without esophagitis: Secondary | ICD-10-CM

## 2019-08-02 DIAGNOSIS — I10 Essential (primary) hypertension: Secondary | ICD-10-CM

## 2019-08-11 ENCOUNTER — Other Ambulatory Visit: Payer: Self-pay | Admitting: Medical

## 2019-08-11 DIAGNOSIS — R21 Rash and other nonspecific skin eruption: Secondary | ICD-10-CM

## 2019-08-11 DIAGNOSIS — Z760 Encounter for issue of repeat prescription: Secondary | ICD-10-CM

## 2019-08-16 ENCOUNTER — Other Ambulatory Visit: Payer: Self-pay | Admitting: Medical

## 2019-08-16 ENCOUNTER — Encounter: Payer: Self-pay | Admitting: Medical

## 2019-08-16 ENCOUNTER — Telehealth: Payer: Self-pay | Admitting: *Deleted

## 2019-08-16 DIAGNOSIS — I10 Essential (primary) hypertension: Secondary | ICD-10-CM

## 2019-08-16 MED ORDER — AMLODIPINE BESYLATE 10 MG PO TABS: 10.0000 mg | ORAL_TABLET | Freq: Every day | ORAL | 0 refills | Status: DC

## 2019-08-16 NOTE — Telephone Encounter (Signed)
Cory Houston called [facility] to advise his wife started not feeling well last night and awoke today feeling worse. She works for [employer] here at [employer] in [place] so she has exposure to lots of students. She has contacted her PCP and is having Covid testing today, Cory Houston is asking what he needs to do. Per H.Ratcliffe PA-C advised Cory Houston he is to quarantine until his wife's test results come back and if she is positive he is to be tested Thursday or Friday (5-6 days post exposure) unless he becomes symptomatic sooner at which time he would get tested immediately. Cory Houston has advised his supervisor and HR of his situation and our recommendations for leave and pay purposes. Cory Houston verbalizes understanding of all topics discussed and has no further needs or concerns at this time. Encouraged him to call us for any future needs, pt agrees.

## 2019-11-04 ENCOUNTER — Other Ambulatory Visit: Payer: Self-pay | Admitting: Medical

## 2019-11-04 DIAGNOSIS — I10 Essential (primary) hypertension: Secondary | ICD-10-CM

## 2019-11-09 ENCOUNTER — Other Ambulatory Visit: Payer: Self-pay | Admitting: Medical

## 2019-11-09 DIAGNOSIS — K219 Gastro-esophageal reflux disease without esophagitis: Secondary | ICD-10-CM

## 2019-11-21 ENCOUNTER — Encounter: Payer: Self-pay | Admitting: Medical

## 2019-11-22 ENCOUNTER — Telehealth: Payer: Self-pay | Admitting: Medical

## 2019-11-22 MED ORDER — MOMETASONE FUROATE 50 MCG/ACT NA SUSP: 2.0000 | Freq: Every day | NASAL | 12 refills | Status: DC

## 2019-11-22 MED ORDER — OMEPRAZOLE 20 MG PO CPDR: 20.0000 mg | DELAYED_RELEASE_CAPSULE | Freq: Every day | ORAL | 1 refills | Status: DC

## 2019-11-22 MED ORDER — AMOXICILLIN-POT CLAVULANATE 875-125 MG PO TABS: 1.0000 | ORAL_TABLET | Freq: Two times a day (BID) | ORAL | 0 refills | Status: DC

## 2019-11-22 NOTE — Progress Notes (Signed)
Patient aware of risks and benefits of telemedicine, gives consent to be treated through telemedicine.  64 yo male  In non acute distress. Recent diagnosis of Covid-19 on Dec 18 self isolated till Dec 18th with symptoms resolving, He now has yellow sinus discharge and runny nose. No HA or fever. Post nasal drip which he gets this time of year, he is using Claritin but no nasal spray.  He also would like a refill on his Omeprazole.  His wife also tested positive for Covid-19, she had much more congeston in nose and in chest , loss of taste and smell, she called her doctor and was placed on a Zpak for sinusitis. Klayton had mild symptoms in comparison.  Review of Systems  Constitutional: Negative.   HENT: Negative.        Runny nose yellow and post nasal drip  Respiratory: Negative.   Cardiovascular: Negative.   Gastrointestinal: Negative.   all other ROS negative, he feels really good.  Even better than yesterday. Also of note his daughter is an ICU nurse and just got her 1st Covid-19 vaccine.    Physical exam. NAD, clearing throat during phone call. No preformed due to telemedicine appt.  A/P  Resolving viral sinusitis. With rhinnorhea. Recovered Covid-19 infection Dec 2020. Post nasal drip Medication refill. Meds ordered this encounter  Medications  . mometasone (NASONEX) 50 MCG/ACT nasal spray    Sig: Place 2 sprays into the nose daily.    Dispense:  17 g    Refill:  12  . amoxicillin-clavulanate (AUGMENTIN) 875-125 MG tablet    Sig: Take 1 tablet by mouth 2 (two) times daily.    Dispense:  20 tablet    Refill:  0    Hold Now  for patient, prescribed if symptoms worsen.  Marland Kitchen omeprazole (PRILOSEC) 20 MG capsule    Sig: Take 1 capsule (20 mg total) by mouth daily.    Dispense:  90 capsule    Refill:  1  He verbalizes understanding and has no questions at the end of our conversation. He is to call or make an appointment if any concerns or questions or worsening illness.

## 2019-11-30 ENCOUNTER — Other Ambulatory Visit: Payer: Self-pay | Admitting: Medical

## 2019-11-30 NOTE — Progress Notes (Signed)
Tried to refill patient's Amlodipine 10 mg but it bounced back in Epic interface. Called patient's pharmacy and did a verbal prescription to pharmacist. 90 tablets , no refills. Messaged patient.

## 2019-12-30 ENCOUNTER — Other Ambulatory Visit: Payer: Self-pay | Admitting: Medical

## 2019-12-30 DIAGNOSIS — E119 Type 2 diabetes mellitus without complications: Secondary | ICD-10-CM

## 2020-01-04 ENCOUNTER — Telehealth: Payer: Self-pay | Admitting: Medical

## 2020-01-04 ENCOUNTER — Other Ambulatory Visit: Payer: Self-pay

## 2020-01-04 DIAGNOSIS — Z8639 Personal history of other endocrine, nutritional and metabolic disease: Secondary | ICD-10-CM

## 2020-01-04 DIAGNOSIS — E119 Type 2 diabetes mellitus without complications: Secondary | ICD-10-CM

## 2020-01-04 DIAGNOSIS — R7309 Other abnormal glucose: Secondary | ICD-10-CM

## 2020-01-04 MED ORDER — METFORMIN HCL ER 500 MG PO TB24: ORAL_TABLET | ORAL | 1 refills | Status: DC

## 2020-01-04 NOTE — Progress Notes (Signed)
  64 yo male in non acute distress, calls office today due to elevated sugar levels. Elevated 328 when he woke up and 303.  Ate cake  10 days and  Valentines day, states he started his diet due to his elevated numbers. Feels fine.  Currently on Metformin XR 500mg  twice daily (1000/day).  Last A1C was 8.,0 with fasting glucose 112.  Reviewed with Dr. Rosanna Randy to increase patient to Metformin XR sent new prescription for  2000mg /day  2 in the am and 2 in the pm. Reviewed with patient the importance of diet and exercise. He asks for refill on medication he is almost our of Metformin XR ( about 10 pills left).S  He verbalizes understanding and has no questions at the end of our conversation. Any questions or concerns he may call the office.

## 2020-01-05 ENCOUNTER — Telehealth: Payer: Self-pay | Admitting: Medical

## 2020-01-05 ENCOUNTER — Other Ambulatory Visit: Payer: Self-pay

## 2020-01-05 DIAGNOSIS — E119 Type 2 diabetes mellitus without complications: Secondary | ICD-10-CM

## 2020-01-05 NOTE — Progress Notes (Signed)
Patient called concerning blood glucose level. Took  2 tablets of Metformin 500 mg  last night . Woke up this am and patient blood glucose was 220 which is about a  100 points lower than the day before. He ate and 2 hours later glucose at  303. Called because he was expecting it to be lower. He did not take 2 tablets by mouth this am. I reviewed with the patient that I left a voicemail for him that he needs to take  2 tablets in the am and 2 tablets in the pm , so he will be on  2000mg  of Metformin a day. He thought he was only needing to take  1500mg /day.  I also reviewed with patient it takes more then one dose to get to therapeutic effects. He is to contact me if he has any symptoms like diaphoresis, dizziness, lightheaded, syncope or any other concerns. Labs will be due in  3 months to check his A1C and Comprehensive Metabolic panel, placed in Epic. He is to contact me in one week if blood sugars are not trending downward. He verbalizes understanding , I again reviewed dosage with patient. And that I had called in his refill prescription of Metformin with new directions on how to take medication. He has no further questions at the end of our conversation.

## 2020-01-16 ENCOUNTER — Other Ambulatory Visit: Payer: Self-pay | Admitting: Medical

## 2020-01-16 DIAGNOSIS — I10 Essential (primary) hypertension: Secondary | ICD-10-CM

## 2020-01-21 ENCOUNTER — Telehealth: Payer: Self-pay

## 2020-01-21 NOTE — Telephone Encounter (Signed)
Patient contacted our office with questions about getting the Covid Vaccine. He had Covid in December and is wanting to know if its too early to get the vaccine.  Our guidelines state that the vaccine cn be administer to a patient with Covid as long as they are 10 days past isolation.  Reviewed with patient and he verbalizes understanding.

## 2020-02-24 ENCOUNTER — Other Ambulatory Visit: Payer: Self-pay | Admitting: Medical

## 2020-02-24 DIAGNOSIS — I10 Essential (primary) hypertension: Secondary | ICD-10-CM

## 2020-02-29 ENCOUNTER — Other Ambulatory Visit: Payer: Self-pay | Admitting: Medical

## 2020-02-29 DIAGNOSIS — I10 Essential (primary) hypertension: Secondary | ICD-10-CM

## 2020-02-29 DIAGNOSIS — Z760 Encounter for issue of repeat prescription: Secondary | ICD-10-CM

## 2020-02-29 MED ORDER — AMLODIPINE BESYLATE 10 MG PO TABS: 10.0000 mg | ORAL_TABLET | Freq: Every day | ORAL | 1 refills | Status: DC

## 2020-02-29 NOTE — Progress Notes (Signed)
Patient states that medication Amlodipine not received by his pharmacy in [place]. Re-entered Amlodipine prescription.

## 2020-03-11 ENCOUNTER — Other Ambulatory Visit: Payer: Self-pay | Admitting: Registered Nurse

## 2020-03-13 NOTE — Telephone Encounter (Signed)
ty

## 2020-03-17 ENCOUNTER — Other Ambulatory Visit: Payer: Self-pay | Admitting: Nurse Practitioner

## 2020-03-17 NOTE — Progress Notes (Signed)
Cory Houston called the office leaving a message reporting he was out of his Amlodipine and needed a RF. Called patient back to find that he had this medication. He endorses he spoke with the pharmacy and they informed him that he had already picked up the medication and he reports he forgot he had it and found it in his glove box. He has no current needs/refills request.

## 2020-04-14 ENCOUNTER — Other Ambulatory Visit: Payer: Self-pay | Admitting: Medical

## 2020-04-14 DIAGNOSIS — I10 Essential (primary) hypertension: Secondary | ICD-10-CM

## 2020-05-20 ENCOUNTER — Other Ambulatory Visit: Payer: Self-pay | Admitting: Medical

## 2020-05-20 DIAGNOSIS — K219 Gastro-esophageal reflux disease without esophagitis: Secondary | ICD-10-CM

## 2020-06-13 ENCOUNTER — Other Ambulatory Visit: Payer: Self-pay | Admitting: Medical

## 2020-06-13 DIAGNOSIS — Z0000 Encounter for general adult medical examination without abnormal findings: Secondary | ICD-10-CM

## 2020-06-13 NOTE — Progress Notes (Signed)
Las for annual  physical exam

## 2020-06-14 ENCOUNTER — Other Ambulatory Visit: Payer: Self-pay

## 2020-06-14 DIAGNOSIS — Z0000 Encounter for general adult medical examination without abnormal findings: Secondary | ICD-10-CM

## 2020-06-14 DIAGNOSIS — R81 Glycosuria: Secondary | ICD-10-CM

## 2020-06-14 LAB — POCT URINALYSIS DIPSTICK
Bilirubin, UA: NEGATIVE
Blood, UA: NEGATIVE
Glucose, UA: POSITIVE — AB
Ketones, UA: NEGATIVE
Leukocytes, UA: NEGATIVE
Nitrite, UA: NEGATIVE
Protein, UA: POSITIVE — AB
Spec Grav, UA: 1.02 (ref 1.010–1.025)
Urobilinogen, UA: 0.2 E.U./dL
pH, UA: 6 (ref 5.0–8.0)

## 2020-06-14 NOTE — Progress Notes (Signed)
Labs for annual examination.

## 2020-06-15 LAB — MICROALBUMIN / CREATININE URINE RATIO
Creatinine, Urine: 137.9 mg/dL
Microalb/Creat Ratio: 21 mg/g creat (ref 0–29)
Microalbumin, Urine: 28.9 ug/mL

## 2020-06-16 LAB — CMP12+LP+TP+TSH+6AC+PSA+CBC…
ALT: 66 IU/L — ABNORMAL HIGH (ref 0–44)
AST: 45 IU/L — ABNORMAL HIGH (ref 0–40)
Albumin/Globulin Ratio: 1.6 (ref 1.2–2.2)
Albumin: 4.4 g/dL (ref 3.8–4.8)
Alkaline Phosphatase: 155 IU/L — ABNORMAL HIGH (ref 48–121)
BUN/Creatinine Ratio: 15 (ref 10–24)
BUN: 16 mg/dL (ref 8–27)
Basophils Absolute: 0 10*3/uL (ref 0.0–0.2)
Basos: 1 %
Bilirubin Total: 0.5 mg/dL (ref 0.0–1.2)
Calcium: 8.8 mg/dL (ref 8.6–10.2)
Chloride: 99 mmol/L (ref 96–106)
Chol/HDL Ratio: 9.3 ratio — ABNORMAL HIGH (ref 0.0–5.0)
Cholesterol, Total: 167 mg/dL (ref 100–199)
Creatinine, Ser: 1.06 mg/dL (ref 0.76–1.27)
EOS (ABSOLUTE): 0.1 10*3/uL (ref 0.0–0.4)
Eos: 1 %
Estimated CHD Risk: 1.8 times avg. — ABNORMAL HIGH (ref 0.0–1.0)
Free Thyroxine Index: 2.5 (ref 1.2–4.9)
GFR calc Af Amer: 87 mL/min/{1.73_m2} (ref >59)
GFR calc non Af Amer: 75 mL/min/{1.73_m2} (ref >59)
GGT: 37 IU/L (ref 0–65)
Globulin, Total: 2.8 g/dL (ref 1.5–4.5)
Glucose: 281 mg/dL — ABNORMAL HIGH (ref 65–99)
HDL: 18 mg/dL — ABNORMAL LOW (ref >39)
Hematocrit: 47.3 % (ref 37.5–51.0)
Hemoglobin: 16.7 g/dL (ref 13.0–17.7)
Immature Grans (Abs): 0 10*3/uL (ref 0.0–0.1)
Immature Granulocytes: 0 %
Iron: 96 ug/dL (ref 38–169)
Lymphocytes Absolute: 1.4 10*3/uL (ref 0.7–3.1)
Lymphs: 25 %
MCH: 30.5 pg (ref 26.6–33.0)
MCHC: 35.3 g/dL (ref 31.5–35.7)
MCV: 87 fL (ref 79–97)
Monocytes Absolute: 0.5 10*3/uL (ref 0.1–0.9)
Monocytes: 9 %
Neutrophils Absolute: 3.7 10*3/uL (ref 1.4–7.0)
Neutrophils: 64 %
Phosphorus: 2.4 mg/dL — ABNORMAL LOW (ref 2.8–4.1)
Platelets: 204 10*3/uL (ref 150–450)
Potassium: 4.4 mmol/L (ref 3.5–5.2)
Prostate Specific Ag, Serum: 0.2 ng/mL (ref 0.0–4.0)
RBC: 5.47 x10E6/uL (ref 4.14–5.80)
RDW: 14.1 % (ref 11.6–15.4)
Sodium: 134 mmol/L (ref 134–144)
T3 Uptake Ratio: 33 % (ref 24–39)
T4, Total: 7.7 ug/dL (ref 4.5–12.0)
TSH: 2.38 u[IU]/mL (ref 0.450–4.500)
Total Protein: 7.2 g/dL (ref 6.0–8.5)
Triglycerides: 1077 mg/dL (ref 0–149)
Uric Acid: 5.8 mg/dL (ref 3.8–8.4)
WBC: 5.7 10*3/uL (ref 3.4–10.8)

## 2020-06-16 LAB — VITAMIN D 25 HYDROXY (VIT D DEFICIENCY, FRACTURES)

## 2020-06-16 LAB — HGB A1C W/O EAG: Hgb A1c MFr Bld: 10.4 % — ABNORMAL HIGH (ref 4.8–5.6)

## 2020-06-20 ENCOUNTER — Other Ambulatory Visit: Payer: Self-pay | Admitting: Medical

## 2020-06-20 DIAGNOSIS — R748 Abnormal levels of other serum enzymes: Secondary | ICD-10-CM

## 2020-06-20 DIAGNOSIS — R81 Glycosuria: Secondary | ICD-10-CM

## 2020-06-20 DIAGNOSIS — R809 Proteinuria, unspecified: Secondary | ICD-10-CM

## 2020-06-20 DIAGNOSIS — E781 Pure hyperglyceridemia: Secondary | ICD-10-CM

## 2020-06-20 DIAGNOSIS — R7309 Other abnormal glucose: Secondary | ICD-10-CM

## 2020-06-20 NOTE — Progress Notes (Signed)
Repeat of labs to ensure accuracy.

## 2020-06-21 ENCOUNTER — Ambulatory Visit: Payer: Self-pay | Admitting: Medical

## 2020-06-21 ENCOUNTER — Encounter: Payer: Self-pay | Admitting: Medical

## 2020-06-21 ENCOUNTER — Other Ambulatory Visit: Payer: Self-pay

## 2020-06-21 VITALS — BP 108/81 | HR 75 | Temp 97.1°F | Resp 16 | Wt 224.8 lb

## 2020-06-21 DIAGNOSIS — R7309 Other abnormal glucose: Secondary | ICD-10-CM

## 2020-06-21 DIAGNOSIS — Z8679 Personal history of other diseases of the circulatory system: Secondary | ICD-10-CM

## 2020-06-21 DIAGNOSIS — R748 Abnormal levels of other serum enzymes: Secondary | ICD-10-CM

## 2020-06-21 DIAGNOSIS — R809 Proteinuria, unspecified: Secondary | ICD-10-CM

## 2020-06-21 DIAGNOSIS — E781 Pure hyperglyceridemia: Secondary | ICD-10-CM

## 2020-06-21 DIAGNOSIS — Z8639 Personal history of other endocrine, nutritional and metabolic disease: Secondary | ICD-10-CM

## 2020-06-21 DIAGNOSIS — E119 Type 2 diabetes mellitus without complications: Secondary | ICD-10-CM

## 2020-06-21 DIAGNOSIS — R239 Unspecified skin changes: Secondary | ICD-10-CM

## 2020-06-21 DIAGNOSIS — R81 Glycosuria: Secondary | ICD-10-CM

## 2020-06-21 LAB — POCT URINALYSIS DIPSTICK
Bilirubin, UA: NEGATIVE
Blood, UA: NEGATIVE
Glucose, UA: NEGATIVE
Leukocytes, UA: NEGATIVE
Nitrite, UA: NEGATIVE
Protein, UA: NEGATIVE
Spec Grav, UA: 1.02 (ref 1.010–1.025)
Urobilinogen, UA: 0.2 E.U./dL
pH, UA: 6 (ref 5.0–8.0)

## 2020-06-21 NOTE — Progress Notes (Addendum)
Subjective:    Patient ID: Cory Houston, male    DOB: 04/15/1956, 64 y.o.   MRN: [id number]  HPI   64 yo male in non acute distress. Review of labs from 06/14/20. Repeat of fasting labs today due to elevated numbers. He was aware of his numbers from his MyChart and he then decided to start a Paleo diet. He avoid a sugar and sweets. He shares is the type of person that does best with not eating sweets vs taking a small bite of a sweet meal and then stopping.Marland Kitchen He states he feels good.  He does have a brown raised area on right fore arm, Left are flat and brown. He has never had a skin check and so I will refer him for an annual skin check with [facility].   Blood pressure 108/81, pulse 75, temperature (!) 97.1 F (36.2 C), temperature source Temporal, resp. rate 16, weight 224 lb 12.8 oz (102 kg), SpO2 98 %.  Allergies  Allergen Reactions   Codeine Anaphylaxis    Current Outpatient Medications:    amLODipine (NORVASC) 10 MG tablet, TAKE 1 TABLET BY MOUTH EVERY DAY, Disp: 90 tablet, Rfl: 0   amLODipine (NORVASC) 10 MG tablet, Take 1 tablet (10 mg total) by mouth daily., Disp: 90 tablet, Rfl: 1   Cholecalciferol (VITAMIN D-3) 5000 units TABS, Take 1 tablet by mouth every other day. , Disp: , Rfl:    loratadine (CLARITIN) 10 MG tablet, TAKE 1 TABLET BY MOUTH EVERY DAY, Disp: 90 tablet, Rfl: 3   metFORMIN (GLUCOPHAGE-XR) 500 MG 24 hr tablet, Take  2 tablets  Twice daily ., Disp: 360 tablet, Rfl: 1   Multiple Vitamin (MULTIVITAMIN) tablet, Take 1 tablet by mouth daily., Disp: , Rfl:    Omega-3 Fatty Acids (FISH OIL) 1200 MG CAPS, Take 2,400 mg by mouth daily after breakfast. 1200 mg in the evening after supper, Disp: , Rfl:    omeprazole (PRILOSEC) 20 MG capsule, TAKE 1 CAPSULE BY MOUTH EVERY DAY, Disp: 90 capsule, Rfl: 1   ramipril (ALTACE) 5 MG capsule, TAKE 1 CAPSULE BY MOUTH DAILY PLEASE CALL OFFICE TO SCHEDULE LABS NOW & BLOOD PRESSURE RECHECK AFTER, Disp: 90 capsule,  Rfl: 0   triamcinolone cream (KENALOG) 0.1 %, Apply 1 application topically 2 (two) times daily. To affected area., Disp: 30 g, Rfl: 0   mometasone (NASONEX) 50 MCG/ACT nasal spray, Place 2 sprays into the nose daily. (Patient not taking: Reported on 06/21/2020), Disp: 17 g, Rfl: 12    Review of Systems  Skin:       Skin changes noted by patient on  His forearms. 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 He also is bald, he wears a hat starting about  10 am. He starts work at  6 am.  All other systems reviewed and are negative.  No complaints just concerned about his lab numbers.    Objective:   Physical Exam Vitals and nursing note reviewed.  Constitutional:      Appearance: Normal appearance.  HENT:     Head: Normocephalic and atraumatic.     Mouth/Throat:     Mouth: Mucous membranes are moist.  Eyes:     Extraocular Movements: Extraocular movements intact.     Conjunctiva/sclera: Conjunctivae normal.     Pupils: Pupils are equal, round, and reactive to light.  Cardiovascular:     Rate and Rhythm: Normal rate and regular rhythm.  Pulmonary:  Effort: Pulmonary effort is normal.  Musculoskeletal:        General: Normal range of motion.     Cervical back: Normal range of motion and neck supple.  Skin:    General: Skin is warm and dry.     Capillary Refill: Capillary refill takes less than 2 seconds.  Neurological:     General: No focal deficit present.     Mental Status: He is alert and oriented to person, place, and time. Mental status is at baseline.  Psychiatric:        Mood and Affect: Mood normal.        Behavior: Behavior normal.        Thought Content: Thought content normal.        Judgment: Judgment normal.           Assessment & Plan:  H/ODiabetes mellitus type  2,  will start  the medication Jardiance   Elevated A1C Plan Dicussed plan with Dr. Rosanna Houston. Hx HTN - controlled Hypertriglyceridemia will recheck numbers. Continue Paleo  diet. Skin changes on forearms needs an annual skin evaluation, will refer to Dermatology. Urine improved , no protein or glucose..Micsrocreatine wnl. Elevated liver enzymes. Fasting labs reordered.  On visual exam blood spun down much better then previous blood work.  Meds ordered this encounter  Medications   empagliflozin (JARDIANCE) 10 MG TABS tablet    Sig: Take 1 tablet (10 mg total) by mouth daily before breakfast.    Dispense:  30 tablet    Refill:  1   He says he is going to continue on the [employer]. Orders Placed This Encounter  Procedures   Hgb A1c w/o eAG    Standing Status:   Future    Standing Expiration Date:   06/21/2021   CMP12+LP+TP+TSH+6AC+PSA+CBC    Standing Status:   Future    Standing Expiration Date:   06/21/2021   Ambulatory referral to Dermatology    Referral Priority:   Routine    Referral Type:   Consultation    Referral Reason:   Specialty Services Required    Referred to Provider:   Novella Olive, MD    Requested Specialty:   Dermatology    Number of Visits Requested:   1   POCT urinalysis dipstick    Standing Status:   Future    Standing Expiration Date:   06/21/2021  Appointment with him and his wife with Cory Houston May the dietitian. Will repeat labs in 3 months. Return to [facility]inc as needed.Patient verbalizes understanding and has no questions at discharge.

## 2020-06-22 ENCOUNTER — Other Ambulatory Visit: Payer: Self-pay

## 2020-06-22 ENCOUNTER — Telehealth: Payer: Self-pay | Admitting: Medical

## 2020-06-22 LAB — COMPREHENSIVE METABOLIC PANEL
ALT: 60 IU/L — ABNORMAL HIGH (ref 0–44)
AST: 46 IU/L — ABNORMAL HIGH (ref 0–40)
Albumin/Globulin Ratio: 1.9 (ref 1.2–2.2)
Albumin: 4.7 g/dL (ref 3.8–4.8)
Alkaline Phosphatase: 132 IU/L — ABNORMAL HIGH (ref 48–121)
BUN/Creatinine Ratio: 20 (ref 10–24)
BUN: 22 mg/dL (ref 8–27)
Bilirubin Total: 0.5 mg/dL (ref 0.0–1.2)
CO2: 19 mmol/L — ABNORMAL LOW (ref 20–29)
Calcium: 8.9 mg/dL (ref 8.6–10.2)
Chloride: 101 mmol/L (ref 96–106)
Creatinine, Ser: 1.1 mg/dL (ref 0.76–1.27)
GFR calc Af Amer: 83 mL/min/{1.73_m2} (ref >59)
GFR calc non Af Amer: 72 mL/min/{1.73_m2} (ref >59)
Globulin, Total: 2.5 g/dL (ref 1.5–4.5)
Glucose: 159 mg/dL — ABNORMAL HIGH (ref 65–99)
Potassium: 4.3 mmol/L (ref 3.5–5.2)
Sodium: 136 mmol/L (ref 134–144)
Total Protein: 7.2 g/dL (ref 6.0–8.5)

## 2020-06-22 LAB — HGB A1C W/O EAG: Hgb A1c MFr Bld: 10.2 % — ABNORMAL HIGH (ref 4.8–5.6)

## 2020-06-22 LAB — LIPID PANEL
Chol/HDL Ratio: 6.5 ratio — ABNORMAL HIGH (ref 0.0–5.0)
Cholesterol, Total: 169 mg/dL (ref 100–199)
HDL: 26 mg/dL — ABNORMAL LOW (ref >39)
LDL Chol Calc (NIH): 81 mg/dL (ref 0–99)
Triglycerides: 379 mg/dL — ABNORMAL HIGH (ref 0–149)
VLDL Cholesterol Cal: 62 mg/dL — ABNORMAL HIGH (ref 5–40)

## 2020-06-22 LAB — HEPATIC FUNCTION PANEL: Bilirubin, Direct: 0.18 mg/dL (ref 0.00–0.40)

## 2020-06-22 MED ORDER — EMPAGLIFLOZIN 10 MG PO TABS: 10.0000 mg | ORAL_TABLET | Freq: Every day | ORAL | 1 refills | Status: DC

## 2020-06-22 NOTE — Progress Notes (Signed)
Patient called .Needing refill on medication jardiance and metformin.No other concerns.  Reviewed labs with patient.

## 2020-06-22 NOTE — Addendum Note (Signed)
Addended by: Rishab Stoudt, Nira Conn R on: 06/22/2020 08:35 AM   Modules accepted: Orders

## 2020-06-26 ENCOUNTER — Other Ambulatory Visit: Payer: Self-pay | Admitting: Medical

## 2020-06-26 DIAGNOSIS — E119 Type 2 diabetes mellitus without complications: Secondary | ICD-10-CM

## 2020-07-04 ENCOUNTER — Other Ambulatory Visit: Payer: Self-pay

## 2020-07-04 ENCOUNTER — Ambulatory Visit: Payer: Self-pay | Admitting: Dietician

## 2020-07-04 NOTE — Progress Notes (Signed)
Nutrition Consult 07/04/2020  CC: Increased blood glucose and A1C levels along with weight gain.  HX. History of DM since 2018.  Attended DM education classes at [employer].  Was able to get glucose under control, but over the last few years, he has not been following a carb restricted diet.  Has gained weigh.  HT: 5'11'  Weight 224 lbs BMI: 31.25 Kg/m2  8/17 weight remains 224 lbs. A1C= 10.2%  Currently blood glucose fasting in the AM is running 130 -150 mg/dl.  Following the new A1C reading; he has attempted to decrease his carbohydrate intake.  Limiting his starches and fat intake.  Using cauliflower rice r/t regular rice.  Avoiding other starches and beans  Following a Paleo diet pattern.    8:30 Breakfast 3 boiled eggs, grapes (large handful), mandarin orange, large banana, chicken tenders (not floured) fried in olive oil.  12:30 Lunch Chopped salad with homemade oil /vinegar dressing and apple, banana and grapes.  Not measuring serving size of his fruits.  5:30-7:30 Dinner [place] taco with out the wrap.  [place] meat, pico, and the cauliflower rice.   Marylyn Appenzeller have some fruit.  Snacking: Admits that when he is home alone in the afternoon b/t 3 pm and 5 pm, it is difficult to avoid eating the ice cream or cookie snacks that are there for his daughter.     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  Has no planned exercise each day.  Recommendations: * Monitor fruit serving sizes *Continue to limit starchy carbohydrates. *Read food labels  (esp. Carb content) Monitor all serving sizes. **Work to avoid the cookies and ice cream.  Your body  can tone down the sweet cravings over time craving. *Continue with the lean meats and limiting fat intake.  Especially saturated fats. * Try doing the CORE exercises present on campus.  Start 2-3 days/week.  This  works a separate group of muscles * Aim for 30-45 gm of CHO at meals with his lean meats, chicken and fish.  Goal: *Lose more weight. *Get A1Cto < 7%  Plan: * Continue the Paleo diet. * Eat less fruit.  Teaching tools  *Foods/Food Group handout *Yellow diet card. *What does the Food Label Say *Prediabetes handout.  Maggie Jani Ploeger, RN, RD, LDN

## 2020-07-06 ENCOUNTER — Other Ambulatory Visit: Payer: Self-pay | Admitting: Medical

## 2020-07-06 DIAGNOSIS — I10 Essential (primary) hypertension: Secondary | ICD-10-CM

## 2020-07-16 ENCOUNTER — Other Ambulatory Visit: Payer: Self-pay | Admitting: Medical

## 2020-07-16 DIAGNOSIS — I10 Essential (primary) hypertension: Secondary | ICD-10-CM

## 2020-07-26 ENCOUNTER — Other Ambulatory Visit: Payer: Self-pay

## 2020-07-26 ENCOUNTER — Telehealth: Payer: Self-pay | Admitting: Medical

## 2020-07-31 ENCOUNTER — Encounter: Payer: Self-pay | Admitting: Medical

## 2020-08-01 ENCOUNTER — Encounter: Payer: Self-pay | Admitting: Medical

## 2020-08-18 ENCOUNTER — Other Ambulatory Visit: Payer: Self-pay | Admitting: Medical

## 2020-08-18 DIAGNOSIS — R7309 Other abnormal glucose: Secondary | ICD-10-CM

## 2020-08-25 ENCOUNTER — Ambulatory Visit: Payer: Self-pay | Admitting: Nurse Practitioner

## 2020-08-25 ENCOUNTER — Other Ambulatory Visit: Payer: Self-pay

## 2020-08-25 ENCOUNTER — Encounter: Payer: Self-pay | Admitting: Nurse Practitioner

## 2020-08-25 VITALS — BP 155/97 | HR 82 | Temp 97.3°F | Resp 18 | Ht 70.0 in | Wt 220.0 lb

## 2020-08-25 DIAGNOSIS — H1031 Unspecified acute conjunctivitis, right eye: Secondary | ICD-10-CM

## 2020-08-25 MED ORDER — GENTAMICIN SULFATE 0.3 % OP SOLN: 2.0000 [drp] | OPHTHALMIC | 0 refills | Status: AC

## 2020-08-25 NOTE — Progress Notes (Signed)
Subjective:    Patient ID: Cory Houston, male    DOB: [birth year], 64 y.o.   MRN: [id number]  HPI  64 year old male who was leaving campus after class two nights ago and felt something in his right eye. Felt scratchy and painful. He was wearing glasses at the time. Went home and attempted to flush eye. Woke up next morning (yesterday) and eye was red and swollen both in the eye and eyelid and under eye per patient. He continued to flush his eye with saline. Also took a benadryl last night because nose started to run from eye watering.   Denies any visual changes, denies any crusted drainage to eyes or eyelashes.   This morning no pain in eye.   He also had a shingles and flu shot this week and experienced a low grade fever after shots without other symptoms.   Patient took benadryl last night and forgot to take HTN meds. BP repeated in office-   Current Outpatient Medications:  .  amLODipine (NORVASC) 10 MG tablet, TAKE 1 TABLET BY MOUTH EVERY DAY, Disp: 90 tablet, Rfl: 1 .  Cholecalciferol (VITAMIN D-3) 5000 units TABS, Take 1 tablet by mouth every other day. , Disp: , Rfl:  .  JARDIANCE 10 MG TABS tablet, TAKE 1 TABLET (10 MG TOTAL) BY MOUTH DAILY BEFORE BREAKFAST., Disp: 30 tablet, Rfl: 1 .  loratadine (CLARITIN) 10 MG tablet, Take by mouth., Disp: , Rfl:  .  metFORMIN (GLUCOPHAGE-XR) 500 MG 24 hr tablet, TAKE 2 TABLETS BY MOUTH TWICE A DAY, Disp: 360 tablet, Rfl: 1 .  Multiple Vitamin (MULTIVITAMIN) tablet, Take 1 tablet by mouth daily., Disp: , Rfl:  .  Omega-3 Fatty Acids (FISH OIL) 1200 MG CAPS, Take 2,400 mg by mouth daily after breakfast. 1200 mg in the evening after supper, Disp: , Rfl:  .  omeprazole (PRILOSEC) 20 MG capsule, TAKE 1 CAPSULE BY MOUTH EVERY DAY, Disp: , Rfl:  .  ramipril (ALTACE) 5 MG capsule, TAKE 1 CAPSULE BY MOUTH DAILY PLEASE CALL OFFICE TO SCHEDULE LABS NOW & BLOOD PRESSURE RECHECK AFTER, Disp: 90 capsule, Rfl: 0 .  triamcinolone cream (KENALOG) 0.1 %,  Apply 1 application topically 2 (two) times daily. To affected area., Disp: 30 g, Rfl: 0 .  gentamicin (GARAMYCIN) 0.3 % ophthalmic solution, Place 2 drops into the right eye every 4 (four) hours for 5 days., Disp: 5 mL, Rfl: 0 .  mometasone (NASONEX) 50 MCG/ACT nasal spray, Place 2 sprays into the nose daily. (Patient not taking: Reported on 06/21/2020), Disp: 17 g, Rfl: 12 .  Multiple Vitamins-Minerals (ONE DAILY CALCIUM/IRON) TABS, Take by mouth. (Patient not taking: Reported on 08/25/2020), Disp: , Rfl:   Review of Systems  HENT: Positive for rhinorrhea.   Eyes: Positive for redness. Negative for visual disturbance.  Respiratory: Negative.    Today's Vitals   08/25/20 0955  BP: (!) 155/97  Pulse: 82  Resp: 18  Temp: (!) 97.3 F (36.3 C)  TempSrc: Temporal  SpO2: 98%  Weight: 220 lb (99.8 kg)  Height: 5\' 10"  (1.778 m)   Body mass index is 31.57 kg/m.   Past Medical History:  Diagnosis Date  . Allergy   . Arthritis   . Diabetes mellitus without complication ([place])   . GERD (gastroesophageal reflux disease)   . Hyperlipidemia   . Hypertension   . Sleep apnea       Objective:   Physical Exam Constitutional:      Appearance: Normal  appearance.  Eyes:     General: Lids are normal.        Right eye: No foreign body, discharge or hordeolum.     Extraocular Movements: Extraocular movements intact.     Conjunctiva/sclera:     Right eye: Right conjunctiva is injected. No chemosis, exudate or hemorrhage.    Pupils: Pupils are equal, round, and reactive to light.   Neurological:     Mental Status: He is alert.           Assessment & Plan:   Advised continuing to keep eye hydrated may use saline, start abx ophthalmic drops per Rx until redness resolves.  RTC if symptoms persist beyond 5 days or with any new or worsening symptoms including but not limited to visual changes.   If any acute changes over the weekend including visual changes or return of pain to eye seek  immediate medical attention as discussed.   Instructed patient to take today's  HTN meds with lunch since he forgot doses last night

## 2020-08-25 NOTE — Progress Notes (Signed)
Blood pressure rechecked later in visit 148/84 in right arm, sitting. Patient forgot to take bp meds last night. He took benadryl and fell asleep and missed evening meds. He will be leaving work at 1 pm and will go home and take bp meds then.

## 2020-09-04 ENCOUNTER — Encounter: Payer: Self-pay | Admitting: Medical

## 2020-10-04 ENCOUNTER — Other Ambulatory Visit: Payer: Self-pay

## 2020-10-06 ENCOUNTER — Other Ambulatory Visit: Payer: Self-pay

## 2020-10-09 ENCOUNTER — Other Ambulatory Visit: Payer: Self-pay

## 2020-10-09 ENCOUNTER — Other Ambulatory Visit: Payer: Self-pay | Admitting: *Deleted

## 2020-10-09 DIAGNOSIS — E781 Pure hyperglyceridemia: Secondary | ICD-10-CM

## 2020-10-09 DIAGNOSIS — Z8639 Personal history of other endocrine, nutritional and metabolic disease: Secondary | ICD-10-CM

## 2020-10-09 DIAGNOSIS — E7800 Pure hypercholesterolemia, unspecified: Secondary | ICD-10-CM

## 2020-10-09 DIAGNOSIS — R7309 Other abnormal glucose: Secondary | ICD-10-CM

## 2020-10-09 DIAGNOSIS — R748 Abnormal levels of other serum enzymes: Secondary | ICD-10-CM

## 2020-10-09 LAB — POCT URINALYSIS DIPSTICK
Blood, UA: NEGATIVE
Glucose, UA: POSITIVE — AB
Ketones, UA: NEGATIVE
Leukocytes, UA: NEGATIVE
Nitrite, UA: NEGATIVE
Protein, UA: NEGATIVE
Spec Grav, UA: 1.02 (ref 1.010–1.025)
Urobilinogen, UA: 0.2 E.U./dL
pH, UA: 6 (ref 5.0–8.0)

## 2020-10-10 LAB — CMP12+LP+TP+TSH+6AC+PSA+CBC…
ALT: 49 IU/L — ABNORMAL HIGH (ref 0–44)
AST: 25 IU/L (ref 0–40)
Albumin/Globulin Ratio: 1.7 (ref 1.2–2.2)
Albumin: 4.8 g/dL (ref 3.8–4.8)
Alkaline Phosphatase: 122 IU/L — ABNORMAL HIGH (ref 44–121)
BUN/Creatinine Ratio: 15 (ref 10–24)
BUN: 17 mg/dL (ref 8–27)
Basophils Absolute: 0 10*3/uL (ref 0.0–0.2)
Basos: 0 %
Bilirubin Total: 0.5 mg/dL (ref 0.0–1.2)
Calcium: 9.2 mg/dL (ref 8.6–10.2)
Chloride: 101 mmol/L (ref 96–106)
Chol/HDL Ratio: 8.2 ratio — ABNORMAL HIGH (ref 0.0–5.0)
Cholesterol, Total: 214 mg/dL — ABNORMAL HIGH (ref 100–199)
Creatinine, Ser: 1.1 mg/dL (ref 0.76–1.27)
EOS (ABSOLUTE): 0 10*3/uL (ref 0.0–0.4)
Eos: 0 %
Estimated CHD Risk: 1.7 times avg. — ABNORMAL HIGH (ref 0.0–1.0)
Free Thyroxine Index: 2 (ref 1.2–4.9)
GFR calc Af Amer: 83 mL/min/{1.73_m2} (ref >59)
GFR calc non Af Amer: 72 mL/min/{1.73_m2} (ref >59)
GGT: 24 IU/L (ref 0–65)
Globulin, Total: 2.8 g/dL (ref 1.5–4.5)
Glucose: 160 mg/dL — ABNORMAL HIGH (ref 65–99)
HDL: 26 mg/dL — ABNORMAL LOW (ref >39)
Hematocrit: 49.8 % (ref 37.5–51.0)
Hemoglobin: 17.6 g/dL (ref 13.0–17.7)
Immature Grans (Abs): 0 10*3/uL (ref 0.0–0.1)
Immature Granulocytes: 0 %
Iron: 109 ug/dL (ref 38–169)
LDH: 206 IU/L (ref 121–224)
LDL Chol Calc (NIH): 138 mg/dL — ABNORMAL HIGH (ref 0–99)
Lymphocytes Absolute: 1.7 10*3/uL (ref 0.7–3.1)
Lymphs: 22 %
MCH: 29.5 pg (ref 26.6–33.0)
MCHC: 35.3 g/dL (ref 31.5–35.7)
MCV: 83 fL (ref 79–97)
Monocytes Absolute: 0.5 10*3/uL (ref 0.1–0.9)
Monocytes: 7 %
Neutrophils Absolute: 5.1 10*3/uL (ref 1.4–7.0)
Neutrophils: 71 %
Phosphorus: 3.1 mg/dL (ref 2.8–4.1)
Platelets: 243 10*3/uL (ref 150–450)
Potassium: 4.5 mmol/L (ref 3.5–5.2)
Prostate Specific Ag, Serum: 0.2 ng/mL (ref 0.0–4.0)
RBC: 5.97 x10E6/uL — ABNORMAL HIGH (ref 4.14–5.80)
RDW: 13.3 % (ref 11.6–15.4)
Sodium: 136 mmol/L (ref 134–144)
T3 Uptake Ratio: 28 % (ref 24–39)
T4, Total: 7.1 ug/dL (ref 4.5–12.0)
TSH: 1.99 u[IU]/mL (ref 0.450–4.500)
Total Protein: 7.6 g/dL (ref 6.0–8.5)
Triglycerides: 272 mg/dL — ABNORMAL HIGH (ref 0–149)
Uric Acid: 6.2 mg/dL (ref 3.8–8.4)
VLDL Cholesterol Cal: 50 mg/dL — ABNORMAL HIGH (ref 5–40)
WBC: 7.4 10*3/uL (ref 3.4–10.8)

## 2020-10-10 LAB — HGB A1C W/O EAG: Hgb A1c MFr Bld: 7.1 % — ABNORMAL HIGH (ref 4.8–5.6)

## 2020-10-16 ENCOUNTER — Other Ambulatory Visit: Payer: Self-pay | Admitting: Medical

## 2020-10-16 DIAGNOSIS — I10 Essential (primary) hypertension: Secondary | ICD-10-CM

## 2020-10-16 NOTE — Telephone Encounter (Signed)
Chart review completed labs recently completed renal function WNL along with LFTs.  BP was elevated at sick visit Oct 2021 with NP Jerline Pain.  Electronic Rx altace 5mg  po daily #90 RF0 sent to his pharmacy of choice and appt with provider pending scheduling with NP Jerline Pain.

## 2020-10-18 ENCOUNTER — Other Ambulatory Visit: Payer: Self-pay | Admitting: Nurse Practitioner

## 2020-10-18 DIAGNOSIS — R7309 Other abnormal glucose: Secondary | ICD-10-CM

## 2020-10-18 DIAGNOSIS — E119 Type 2 diabetes mellitus without complications: Secondary | ICD-10-CM

## 2020-10-20 ENCOUNTER — Encounter: Payer: Self-pay | Admitting: Nurse Practitioner

## 2020-10-25 ENCOUNTER — Ambulatory Visit: Payer: Self-pay | Admitting: Medical

## 2020-10-30 ENCOUNTER — Other Ambulatory Visit: Payer: Self-pay

## 2020-10-30 ENCOUNTER — Ambulatory Visit: Payer: Self-pay | Admitting: Medical

## 2020-10-30 DIAGNOSIS — Z79899 Other long term (current) drug therapy: Secondary | ICD-10-CM

## 2020-10-30 DIAGNOSIS — Z8639 Personal history of other endocrine, nutritional and metabolic disease: Secondary | ICD-10-CM

## 2020-10-30 DIAGNOSIS — R7309 Other abnormal glucose: Secondary | ICD-10-CM

## 2020-10-30 DIAGNOSIS — E782 Mixed hyperlipidemia: Secondary | ICD-10-CM

## 2020-10-30 NOTE — Progress Notes (Signed)
Subjective:    Patient ID: Cory Houston, male    DOB: 1956/09/24, 64 y.o.   MRN: [id number]  HPI 64 yo male in non acute distress, here for lab review.   Significant improvement on A1C, cholesterol still elevated. Suggested to patient that would like to start a Satin , he currently does not want to start this medication and would li8ke to continue to work on his diet.   Weighed with heavy boots on today.    He is doing  3 days a week stretches and exercised. For strengthening  Left knee muscular area of leg. [facility]  PT x 1x/week for guidance. . Allergies  Allergen Reactions  . Codeine Anaphylaxis    Current Outpatient Medications:  .  amLODipine (NORVASC) 10 MG tablet, TAKE 1 TABLET BY MOUTH EVERY DAY, Disp: 90 tablet, Rfl: 1 .  Cholecalciferol (VITAMIN D-3) 5000 units TABS, Take 1 tablet by mouth every other day. , Disp: , Rfl:  .  JARDIANCE 10 MG TABS tablet, TAKE 1 TABLET BY MOUTH DAILY BEFORE BREAKFAST, Disp: 30 tablet, Rfl: 0 .  loratadine (CLARITIN) 10 MG tablet, Take by mouth., Disp: , Rfl:  .  metFORMIN (GLUCOPHAGE-XR) 500 MG 24 hr tablet, TAKE 2 TABLETS BY MOUTH TWICE A DAY, Disp: 360 tablet, Rfl: 1 .  Multiple Vitamin (MULTIVITAMIN) tablet, Take 1 tablet by mouth daily., Disp: , Rfl:  .  Omega-3 Fatty Acids (FISH OIL) 1200 MG CAPS, Take 2,400 mg by mouth daily after breakfast. 1200 mg in the evening after supper, Disp: , Rfl:  .  omeprazole (PRILOSEC) 20 MG capsule, TAKE 1 CAPSULE BY MOUTH EVERY DAY, Disp: , Rfl:  .  ramipril (ALTACE) 5 MG capsule, TAKE 1 CAPSULE BY MOUTH DAILY PLEASE CALL OFFICE TO SCHEDULE LABS NOW & BLOOD PRESSURE RECHECK AFTER, Disp: 90 capsule, Rfl: 0 .  triamcinolone cream (KENALOG) 0.1 %, Apply 1 application topically 2 (two) times daily. To affected area., Disp: 30 g, Rfl: 0  Review of Systems Feeling well, No complaints today ROS. Occasionally gets epididymitis, today he has no symptoms. No swelling or redness. He currently  does not need any treatment but he wanted to let me know  Be aware incase he needs antibiotics.        Objective:   Physical Exam Vitals and nursing note reviewed.  Constitutional:      Appearance: Normal appearance.  HENT:     Head: Normocephalic.  Eyes:     Extraocular Movements: Extraocular movements intact.     Conjunctiva/sclera: Conjunctivae normal.     Pupils: Pupils are equal, round, and reactive to light.  Pulmonary:     Effort: Pulmonary effort is normal.  Musculoskeletal:        General: Normal range of motion.     Cervical back: Normal range of motion.  Skin:    General: Skin is warm and dry.  Neurological:     General: No focal deficit present.     Mental Status: He is alert and oriented to person, place, and time. Mental status is at baseline.  Psychiatric:        Mood and Affect: Mood normal.        Behavior: Behavior normal.        Thought Content: Thought content normal.        Judgment: Judgment normal.       Recent Results (from the past 2160 hour(s))  POCT Urinalysis Dipstick     Status: Abnormal  Collection Time: 10/09/20  9:08 AM  Result Value Ref Range   Color, UA Dark Yellow    Clarity, UA Clear    Glucose, UA Positive (A) Negative    Comment: 2000   Bilirubin, UA Small    Ketones, UA Negative    Spec Grav, UA 1.020 1.010 - 1.025   Blood, UA Negative    pH, UA 6.0 5.0 - 8.0   Protein, UA Negative Negative   Urobilinogen, UA 0.2 0.2 or 1.0 E.U./dL   Nitrite, UA Negative    Leukocytes, UA Negative Negative   Appearance     Odor    CMP12+LP+TP+TSH+6AC+PSA+CBC.     Status: Abnormal   Collection Time: 10/09/20  9:12 AM  Result Value Ref Range   Glucose 160 (H) 65 - 99 mg/dL   Uric Acid 6.2 3.8 - 8.4 mg/dL    Comment:            Therapeutic target for gout patients: <6.0   BUN 17 8 - 27 mg/dL   Creatinine, Ser 1.10 0.76 - 1.27 mg/dL   GFR calc non Af Amer 72 >59 mL/min/1.73   GFR calc Af Amer 83 >59 mL/min/1.73    Comment: **In  accordance with recommendations from the NKF-ASN Task force,**   [facility] is in the process of updating its eGFR calculation to the   2021 CKD-EPI creatinine equation that estimates kidney function   without a race variable.    BUN/Creatinine Ratio 15 10 - 24   Sodium 136 134 - 144 mmol/L   Potassium 4.5 3.5 - 5.2 mmol/L   Chloride 101 96 - 106 mmol/L   Calcium 9.2 8.6 - 10.2 mg/dL   Phosphorus 3.1 2.8 - 4.1 mg/dL   Total Protein 7.6 6.0 - 8.5 g/dL   Albumin 4.8 3.8 - 4.8 g/dL   Globulin, Total 2.8 1.5 - 4.5 g/dL   Albumin/Globulin Ratio 1.7 1.2 - 2.2   Bilirubin Total 0.5 0.0 - 1.2 mg/dL   Alkaline Phosphatase 122 (H) 44 - 121 IU/L    Comment:               **Please note reference interval change**   LDH 206 121 - 224 IU/L   AST 25 0 - 40 IU/L   ALT 49 (H) 0 - 44 IU/L   GGT 24 0 - 65 IU/L   Iron 109 38 - 169 ug/dL   Cholesterol, Total 214 (H) 100 - 199 mg/dL   Triglycerides 272 (H) 0 - 149 mg/dL   HDL 26 (L) >39 mg/dL   VLDL Cholesterol Cal 50 (H) 5 - 40 mg/dL   LDL Chol Calc (NIH) 138 (H) 0 - 99 mg/dL   Chol/HDL Ratio 8.2 (H) 0.0 - 5.0 ratio    Comment:                                   T. Chol/HDL Ratio                                             Men  Women                               1/2 Avg.Risk  3.4    3.3  Avg.Risk  5.0    4.4                                2X Avg.Risk  9.6    7.1                                3X Avg.Risk 23.4   11.0    Estimated CHD Risk 1.7 (H) 0.0 - 1.0 times avg.    Comment: The CHD Risk is based on the T. Chol/HDL ratio. Other factors affect CHD Risk such as hypertension, smoking, diabetes, severe obesity, and family history of premature CHD.    TSH 1.990 0.450 - 4.500 uIU/mL   T4, Total 7.1 4.5 - 12.0 ug/dL   T3 Uptake Ratio 28 24 - 39 %   Free Thyroxine Index 2.0 1.2 - 4.9   Prostate Specific Ag, Serum 0.2 0.0 - 4.0 ng/mL    Comment: Roche ECLIA methodology. According to the American Urological  Association, Serum PSA should decrease and remain at undetectable levels after radical prostatectomy. The AUA defines biochemical recurrence as an initial PSA value 0.2 ng/mL or greater followed by a subsequent confirmatory PSA value 0.2 ng/mL or greater. Values obtained with different assay methods or kits cannot be used interchangeably. Results cannot be interpreted as absolute evidence of the presence or absence of malignant disease.    WBC 7.4 3.4 - 10.8 x10E3/uL   RBC 5.97 (H) 4.14 - 5.80 x10E6/uL   Hemoglobin 17.6 13.0 - 17.7 g/dL   Hematocrit 49.8 37.5 - 51.0 %   MCV 83 79 - 97 fL   MCH 29.5 26.6 - 33.0 pg   MCHC 35.3 31.5 - 35.7 g/dL   RDW 13.3 11.6 - 15.4 %   Platelets 243 150 - 450 x10E3/uL   Neutrophils 71 Not Estab. %   Lymphs 22 Not Estab. %   Monocytes 7 Not Estab. %   Eos 0 Not Estab. %   Basos 0 Not Estab. %   Neutrophils Absolute 5.1 1.4 - 7.0 x10E3/uL   Lymphocytes Absolute 1.7 0.7 - 3.1 x10E3/uL   Monocytes Absolute 0.5 0.1 - 0.9 x10E3/uL   EOS (ABSOLUTE) 0.0 0.0 - 0.4 x10E3/uL   Basophils Absolute 0.0 0.0 - 0.2 x10E3/uL   Immature Granulocytes 0 Not Estab. %   Immature Grans (Abs) 0.0 0.0 - 0.1 x10E3/uL  Hgb A1c w/o eAG     Status: Abnormal   Collection Time: 10/09/20  9:12 AM  Result Value Ref Range   Hgb A1c MFr Bld 7.1 (H) 4.8 - 5.6 %    Comment:          Prediabetes: 5.7 - 6.4          Diabetes: >6.4          Glycemic control for adults with diabetes: <7.0       Assessment & Plan:   Lab review with patient Diabetic Mellitus type 2 on Jardiance and Metformin Hyperlipidemia, mixed Medication Management He would like  90 days Jardiance not 30 day next prescription. Diet management per patient , hold off on Statin per patient request, he would like to wait another  3 moths to see if he can decrease his numbers. He verbalizes understanding and has no questions at discharge. Return for labs.  Any concerns please call the office or send a MyChart message.

## 2020-11-19 ENCOUNTER — Other Ambulatory Visit: Payer: Self-pay | Admitting: Registered Nurse

## 2020-11-19 DIAGNOSIS — R7309 Other abnormal glucose: Secondary | ICD-10-CM

## 2020-11-23 ENCOUNTER — Other Ambulatory Visit: Payer: Self-pay | Admitting: Medical

## 2020-11-23 DIAGNOSIS — K219 Gastro-esophageal reflux disease without esophagitis: Secondary | ICD-10-CM

## 2020-12-12 ENCOUNTER — Other Ambulatory Visit: Payer: Self-pay | Admitting: Medical

## 2020-12-12 DIAGNOSIS — R7309 Other abnormal glucose: Secondary | ICD-10-CM

## 2020-12-21 ENCOUNTER — Other Ambulatory Visit: Payer: Self-pay | Admitting: Medical

## 2020-12-21 DIAGNOSIS — E119 Type 2 diabetes mellitus without complications: Secondary | ICD-10-CM

## 2021-01-12 ENCOUNTER — Other Ambulatory Visit: Payer: Self-pay | Admitting: Nurse Practitioner

## 2021-01-12 DIAGNOSIS — R7309 Other abnormal glucose: Secondary | ICD-10-CM

## 2021-01-30 ENCOUNTER — Ambulatory Visit: Payer: Self-pay | Admitting: Medical

## 2021-01-30 ENCOUNTER — Other Ambulatory Visit: Payer: Self-pay

## 2021-01-30 ENCOUNTER — Encounter: Payer: Self-pay | Admitting: Medical

## 2021-01-30 VITALS — BP 140/88 | HR 88 | Temp 98.2°F | Resp 18 | Wt 226.8 lb

## 2021-01-30 DIAGNOSIS — Z8639 Personal history of other endocrine, nutritional and metabolic disease: Secondary | ICD-10-CM

## 2021-01-30 DIAGNOSIS — R7309 Other abnormal glucose: Secondary | ICD-10-CM

## 2021-01-30 DIAGNOSIS — Z79899 Other long term (current) drug therapy: Secondary | ICD-10-CM

## 2021-01-30 DIAGNOSIS — I493 Ventricular premature depolarization: Secondary | ICD-10-CM

## 2021-01-30 DIAGNOSIS — R002 Palpitations: Secondary | ICD-10-CM

## 2021-01-30 DIAGNOSIS — R059 Cough, unspecified: Secondary | ICD-10-CM

## 2021-01-30 LAB — POCT URINALYSIS DIPSTICK
Bilirubin, UA: NEGATIVE
Blood, UA: NEGATIVE
Glucose, UA: POSITIVE — AB
Ketones, UA: NEGATIVE
Leukocytes, UA: NEGATIVE
Nitrite, UA: NEGATIVE
Protein, UA: NEGATIVE
Spec Grav, UA: 1.01 (ref 1.010–1.025)
Urobilinogen, UA: 0.2 E.U./dL
pH, UA: 5 (ref 5.0–8.0)

## 2021-01-30 NOTE — Progress Notes (Signed)
Subjective:    Patient ID: Cory Houston, male    DOB: 1956/10/11, 65 y.o.   MRN: [id number]  HPI 65 yo male in non acute distress, comes in today to discuss his heart beat. He states when sitting still he feels his heart beating starting on Sunday night.. He does state he has a slight cough but not sure this is from allergies.. Sitting today during our phone call he states he did not feel his heart like he did the last  2 days.   He has been sitting while this has been occurring no  Dizziness or lightheadedness, no HA.  Yesterday some anxiety it took about  2 hours for him to get in touch with his wife but otherwise he felt fine.   Eating well.  Father in  Late 6's had a defibrillator placed history of  CHF. Lived to be  65 yo.(squirrel cage pump).   He tells me he drinks about 6 sodas a day.   Blood pressure 140/88, pulse 88, temperature 98.2 F (36.8 C), temperature source Oral, resp. rate 18, weight 226 lb 12.8 oz (102.9 kg), SpO2 97 %.  Past Medical History:  Diagnosis Date  . Allergy   . Arthritis   . Diabetes mellitus without complication ([place])   . GERD (gastroesophageal reflux disease)   . Hyperlipidemia   . Hypertension   . Sleep apnea    Past Surgical History:  Procedure Laterality Date  . VASECTOMY      Allergies  Allergen Reactions  . Codeine Anaphylaxis    Family History  Problem Relation Age of Onset  . Diabetes Mother   . Obesity Mother   . Hypertension Mother   . Heart disease Mother   . Congestive Heart Failure Mother   . Hypercholesterolemia Mother   . Diabetes Father   . Congestive Heart Failure Father         cardiopulmonary edema.- Father 70 deceased from complications.   . Diverticulitis Father   . Hypertension Father   . Hypercholesterolemia Father   . Cataracts Sister   . Heart murmur Son   . Hypertension Paternal Aunt   . Hypertension Paternal Uncle   . Heart attack Maternal Grandmother   . Lung cancer Maternal Grandmother    . Stroke Paternal Grandmother   . Stroke Paternal Grandfather     Social History   Socioeconomic History  . Marital status: Married    Spouse name: Not on file  . Number of children: Not on file  . Years of education: Not on file  . Highest education level: Not on file  Occupational History  . Not on file  Tobacco Use  . Smoking status: Never Smoker  . Smokeless tobacco: Never Used  Vaping Use  . Vaping Use: Not on file  Substance and Sexual Activity  . Alcohol use: Yes    Comment: rarely 1 per month  . Drug use: No  . Sexual activity: Yes  Other Topics Concern  . Not on file  Social History Narrative  . Not on file   Social Determinants of Health   Financial Resource Strain: Not on file  Food Insecurity: Not on file  Transportation Needs: Not on file  Physical Activity: Not on file  Stress: Not on file  Social Connections: Not on file  Intimate Partner Violence: Not on file    Review of Systems  Constitutional: Negative for chills, fatigue and fever.  Respiratory: Positive for cough (slight cough). Negative for chest  tightness.   Psychiatric/Behavioral: The patient is not nervous/anxious.   He recently has Worked with 1 colleague  Who have been diagnosed with pneumonia.    Objective:   Physical Exam Constitutional:      Appearance: Normal appearance.  HENT:     Head: Normocephalic.  Eyes:     Extraocular Movements: Extraocular movements intact.     Conjunctiva/sclera: Conjunctivae normal.     Pupils: Pupils are equal, round, and reactive to light.  Cardiovascular:     Rate and Rhythm: Normal rate.     Heart sounds: No murmur heard. No friction rub. No gallop.      Comments: Skipping a beat while I listened to him. Pulmonary:     Effort: Pulmonary effort is normal.     Breath sounds: Normal breath sounds. No wheezing, rhonchi or rales.  Musculoskeletal:        General: Normal range of motion.  Skin:    General: Skin is warm and dry.     Capillary  Refill: Capillary refill takes less than 2 seconds.  Neurological:     General: No focal deficit present.     Mental Status: He is alert and oriented to person, place, and time. Mental status is at baseline.  Psychiatric:        Mood and Affect: Mood normal.        Behavior: Behavior normal.        Thought Content: Thought content normal.        Judgment: Judgment normal.      EKG Sinus rhythm with occasional PVC HR 82 PR Interval 161ms    Assessment & Plan:  Palpitations Stop/ decrease diet soda intake it may be causing the Palpitations. Information given to patient on palpitations. Reviewed Red Flags with patient. Denies chronic cough on Ramipril and  Amlodipine for  Hypertension. Today 140/88.. Taking Claritin daily for seasonal allergies. RN called Dr. Johnny Bridge office, information ran by DOD Dr. Fletcher Anon reviewed, since patient is not symptomatic he can wait till Friday for . Appointment for 9am with Dr. Modena Nunnery ([facility]) [phone].Marland Kitchen Patient denies any shortness of breath , chest pain, dizziness, lightheadedness, passing out with or without feeling the missed beats. Symptoms. If any of these occur go to the nearest Emergency Department.Patient verbalizes understanding and has no questions at discharge.

## 2021-01-30 NOTE — Patient Instructions (Addendum)
Palpitations Palpitations are feelings that your heartbeat is not normal. Your heartbeat may feel like it is:  Uneven.  Faster than normal.  Fluttering.  Skipping a beat. This is usually not a serious problem. In some cases, you may need tests to rule out any serious problems. Follow these instructions at home: Pay attention to any changes in your condition. Take these actions to help manage your symptoms: Eating and drinking  Avoid: ? Coffee, tea, soft drinks, and energy drinks. ? Chocolate. ? Alcohol. ? Diet pills. Lifestyle  Try to lower your stress. These things can help you relax: ? Yoga. ? Deep breathing and meditation. ? Exercise. ? Using words and images to create positive thoughts (guided imagery). ? Using your mind to control things in your body (biofeedback).  Do not use drugs.  Get plenty of rest and sleep. Keep a regular bed time.   General instructions  Take over-the-counter and prescription medicines only as told by your doctor.  Do not use any products that contain nicotine or tobacco, such as cigarettes and e-cigarettes. If you need help quitting, ask your doctor.  Keep all follow-up visits as told by your doctor. This is important. You may need more tests if palpitations do not go away or get worse.   Contact a doctor if:  Your symptoms last more than 24 hours.  Your symptoms occur more often. Get help right away if you:  Have chest pain.  Feel short of breath.  Have a very bad headache.  Feel dizzy.  Pass out (faint). Summary  Palpitations are feelings that your heartbeat is uneven or faster than normal. It may feel like your heart is fluttering or skipping a beat.  Avoid food and drinks that may cause palpitations. These include caffeine, chocolate, and alcohol.  Try to lower your stress. Do not smoke or use drugs.  Get help right away if you faint or have chest pain, shortness of breath, a severe headache, or dizziness. This  information is not intended to replace advice given to you by your health care provider. Make sure you discuss any questions you have with your health care provider. Document Revised: 12/17/2017 Document Reviewed: 12/17/2017 Elsevier Patient Education  2021 [employer]  

## 2021-01-31 ENCOUNTER — Telehealth: Payer: Self-pay | Admitting: Medical

## 2021-01-31 ENCOUNTER — Encounter: Payer: Self-pay | Admitting: Medical

## 2021-01-31 ENCOUNTER — Other Ambulatory Visit: Payer: Self-pay

## 2021-01-31 LAB — COMPREHENSIVE METABOLIC PANEL
ALT: 54 IU/L — ABNORMAL HIGH (ref 0–44)
AST: 34 IU/L (ref 0–40)
Albumin/Globulin Ratio: 1.9 (ref 1.2–2.2)
Albumin: 4.9 g/dL — ABNORMAL HIGH (ref 3.8–4.8)
Alkaline Phosphatase: 127 IU/L — ABNORMAL HIGH (ref 44–121)
BUN/Creatinine Ratio: 19 (ref 10–24)
BUN: 19 mg/dL (ref 8–27)
Bilirubin Total: 0.4 mg/dL (ref 0.0–1.2)
CO2: 22 mmol/L (ref 20–29)
Calcium: 9.3 mg/dL (ref 8.6–10.2)
Chloride: 99 mmol/L (ref 96–106)
Creatinine, Ser: 1.01 mg/dL (ref 0.76–1.27)
Globulin, Total: 2.6 g/dL (ref 1.5–4.5)
Glucose: 162 mg/dL — ABNORMAL HIGH (ref 65–99)
Potassium: 4.4 mmol/L (ref 3.5–5.2)
Sodium: 137 mmol/L (ref 134–144)
Total Protein: 7.5 g/dL (ref 6.0–8.5)
eGFR: 84 mL/min/{1.73_m2} (ref >59)

## 2021-01-31 LAB — CBC WITH DIFFERENTIAL/PLATELET
Basophils Absolute: 0 10*3/uL (ref 0.0–0.2)
Basos: 0 %
EOS (ABSOLUTE): 0.1 10*3/uL (ref 0.0–0.4)
Eos: 1 %
Hematocrit: 50.1 % (ref 37.5–51.0)
Hemoglobin: 17.4 g/dL (ref 13.0–17.7)
Immature Grans (Abs): 0 10*3/uL (ref 0.0–0.1)
Immature Granulocytes: 0 %
Lymphocytes Absolute: 1.6 10*3/uL (ref 0.7–3.1)
Lymphs: 21 %
MCH: 30 pg (ref 26.6–33.0)
MCHC: 34.7 g/dL (ref 31.5–35.7)
MCV: 86 fL (ref 79–97)
Monocytes Absolute: 0.6 10*3/uL (ref 0.1–0.9)
Monocytes: 8 %
Neutrophils Absolute: 5.2 10*3/uL (ref 1.4–7.0)
Neutrophils: 70 %
Platelets: 211 10*3/uL (ref 150–450)
RBC: 5.8 x10E6/uL (ref 4.14–5.80)
RDW: 14.5 % (ref 11.6–15.4)
WBC: 7.5 10*3/uL (ref 3.4–10.8)

## 2021-01-31 LAB — LIPID PANEL
Chol/HDL Ratio: 7.6 ratio — ABNORMAL HIGH (ref 0.0–5.0)
Cholesterol, Total: 189 mg/dL (ref 100–199)
HDL: 25 mg/dL — ABNORMAL LOW (ref >39)
LDL Chol Calc (NIH): 104 mg/dL — ABNORMAL HIGH (ref 0–99)
Triglycerides: 349 mg/dL — ABNORMAL HIGH (ref 0–149)
VLDL Cholesterol Cal: 60 mg/dL — ABNORMAL HIGH (ref 5–40)

## 2021-01-31 LAB — HGB A1C W/O EAG: Hgb A1c MFr Bld: 7 % — ABNORMAL HIGH (ref 4.8–5.6)

## 2021-02-02 ENCOUNTER — Ambulatory Visit (INDEPENDENT_AMBULATORY_CARE_PROVIDER_SITE_OTHER): Payer: BC Managed Care – PPO | Admitting: Cardiology

## 2021-02-02 ENCOUNTER — Other Ambulatory Visit: Payer: Self-pay

## 2021-02-02 ENCOUNTER — Encounter: Payer: Self-pay | Admitting: Cardiology

## 2021-02-02 ENCOUNTER — Ambulatory Visit (INDEPENDENT_AMBULATORY_CARE_PROVIDER_SITE_OTHER): Payer: BC Managed Care – PPO

## 2021-02-02 VITALS — BP 128/76 | HR 60 | Ht 70.0 in | Wt 225.0 lb

## 2021-02-02 DIAGNOSIS — E781 Pure hyperglyceridemia: Secondary | ICD-10-CM

## 2021-02-02 DIAGNOSIS — I10 Essential (primary) hypertension: Secondary | ICD-10-CM

## 2021-02-02 DIAGNOSIS — R002 Palpitations: Secondary | ICD-10-CM | POA: Diagnosis not present

## 2021-02-02 MED ORDER — FENOFIBRATE 145 MG PO TABS: 145.0000 mg | ORAL_TABLET | Freq: Every day | ORAL | 3 refills | Status: DC

## 2021-02-02 NOTE — Progress Notes (Signed)
Cardiology Office Note:    Date:  02/02/2021   ID:  [name], [name] 1956/04/03, MRN [id number]  PCP:  Ratcliffe, [name], [place]  Cardiologist:  [name], MD  Advanced Practice Provider:  No care team member to display Electrophysiologist:  None       Referring MD: Marcy Salvo*   Chief Complaint  Patient presents with  . Other    Referred by PCP for Palpitations / PVCs. Meds reviewed verbally with patient.    Cory Houston is a 65 y.o. male who is being seen today for the evaluation of palpitations at the request of Lily Peer, Estill Dooms, P*.   History of Present Illness:    Cory Houston is a 65 y.o. male with a hx of hypertension, hypertriglyceridemia, diabetes presents due to palpitations.  Patient complains of having rapid heartbeats beginning 5 days ago while he was at home.  Symptoms last for few minutes, not associated with chest pain, shortness of breath, dizziness or syncope.  Symptoms went on and off for the entire day.  The next day or 2, while he was in class, he again noticed symptoms of palpitations.  He had a follow-up appointment with primary care provider for blood work.  During that appointment, EKG was obtained which showed occasional PVCs.  He denies any personal history of heart disease.  His dad had a defibrillator placed.   Past Medical History:  Diagnosis Date  . Allergy   . Arthritis   . Diabetes mellitus without complication ([place])   . GERD (gastroesophageal reflux disease)   . Hyperlipidemia   . Hypertension   . Sleep apnea     Past Surgical History:  Procedure Laterality Date  . VASECTOMY      Current Medications: Current Meds  Medication Sig  . amLODipine (NORVASC) 10 MG tablet TAKE 1 TABLET BY MOUTH EVERY DAY  . Cholecalciferol (VITAMIN D-3) 5000 units TABS Take 1 tablet by mouth every other day.   . fenofibrate (TRICOR) 145 MG tablet Take 1 tablet (145 mg total) by mouth daily.   Marland Kitchen JARDIANCE 10 MG TABS tablet TAKE 1 TABLET BY MOUTH EVERY DAY BEFORE BREAKFAST  . loratadine (CLARITIN) 10 MG tablet Take by mouth.  . metFORMIN (GLUCOPHAGE-XR) 500 MG 24 hr tablet TAKE 2 TABLETS BY MOUTH TWICE A DAY  . Multiple Vitamin (MULTIVITAMIN) tablet Take 1 tablet by mouth daily.  . Omega-3 Fatty Acids (FISH OIL) 1200 MG CAPS Take 2,400 mg by mouth daily after breakfast. 1200 mg in the evening after supper  . omeprazole (PRILOSEC) 20 MG capsule TAKE 1 CAPSULE BY MOUTH EVERY DAY  . ramipril (ALTACE) 5 MG capsule TAKE 1 CAPSULE BY MOUTH DAILY PLEASE CALL OFFICE TO SCHEDULE LABS NOW & BLOOD PRESSURE RECHECK AFTER  . triamcinolone cream (KENALOG) 0.1 % Apply 1 application topically 2 (two) times daily. To affected area.     Allergies:   Codeine   Social History   Socioeconomic History  . Marital status: Married    Spouse name: Not on file  . Number of children: Not on file  . Years of education: Not on file  . Highest education level: Not on file  Occupational History  . Not on file  Tobacco Use  . Smoking status: Never Smoker  . Smokeless tobacco: Never Used  Vaping Use  . Vaping Use: Not on file  Substance and Sexual Activity  . Alcohol use: Yes    Comment:  rarely 1 per month  . Drug use: No  . Sexual activity: Yes  Other Topics Concern  . Not on file  Social History Narrative  . Not on file   Social Determinants of Health   Financial Resource Strain: Not on file  Food Insecurity: Not on file  Transportation Needs: Not on file  Physical Activity: Not on file  Stress: Not on file  Social Connections: Not on file     Family History: The patient's family history includes Cataracts in his sister; Congestive Heart Failure in his father and mother; Diabetes in his father and mother; Diverticulitis in his father; Heart attack in his maternal grandmother; Heart disease in his mother; Heart murmur in his son; Hypercholesterolemia in his father and mother; Hypertension  in his father, mother, paternal aunt, and paternal uncle; Lung cancer in his maternal grandmother; Obesity in his mother; Stroke in his paternal grandfather and paternal grandmother.  ROS:   Please see the history of present illness.     All other systems reviewed and are negative.  EKGs/Labs/Other Studies Reviewed:    The following studies were reviewed today:   EKG:  EKG is  ordered today.  The ekg ordered today demonstrates normal sinus rhythm, normal ECG.  Recent Labs: 10/09/2020: TSH 1.990 01/30/2021: ALT 54; BUN 19; Creatinine, Ser 1.01; Hemoglobin 17.4; Platelets 211; Potassium 4.4; Sodium 137  Recent Lipid Panel    Component Value Date/Time   CHOL 189 01/30/2021 0805   TRIG 349 (H) 01/30/2021 0805   HDL 25 (L) 01/30/2021 0805   CHOLHDL 7.6 (H) 01/30/2021 0805   LDLCALC 104 (H) 01/30/2021 0805     Risk Assessment/Calculations:      Physical Exam:    VS:  BP 128/76 (BP Location: Left Arm, Patient Position: Sitting, Cuff Size: Normal)   Pulse 60   Ht 5\' 10"  (1.778 m)   Wt 225 lb (102.1 kg)   SpO2 97%   BMI 32.28 kg/m     Wt Readings from Last 3 Encounters:  02/02/21 225 lb (102.1 kg)  01/30/21 226 lb 12.8 oz (102.9 kg)  08/25/20 220 lb (99.8 kg)     GEN:  Well nourished, well developed in no acute distress HEENT: Normal NECK: No JVD; No carotid bruits LYMPHATICS: No lymphadenopathy CARDIAC: RRR, no murmurs, rubs, gallops RESPIRATORY:  Clear to auscultation without rales, wheezing or rhonchi  ABDOMEN: Soft, non-tender, non-distended MUSCULOSKELETAL:  No edema; No deformity  SKIN: Warm and dry NEUROLOGIC:  Alert and oriented x 3 PSYCHIATRIC:  Normal affect   ASSESSMENT:    1. Palpitations   2. Pure hypertriglyceridemia   3. Primary hypertension    PLAN:    In order of problems listed above:  1. Palpitations, previous EKG with occasional PVCs.  Place cardiac monitor to evaluate any significant arrhythmias and document PVC burden. 2. History of  hypertriglyceridemia.  Start fenofibrate.  Repeat fasting lipid profile in 6 weeks. 3. Hypertension, BP controlled.  Continue Norvasc and ramipril.  Follow-up after cardiac monitor and lipid panel     Medication Adjustments/Labs and Tests Ordered: Current medicines are reviewed at length with the patient today.  Concerns regarding medicines are outlined above.  Orders Placed This Encounter  Procedures  . LONG TERM MONITOR (3-14 DAYS)  . EKG 12-Lead   Meds ordered this encounter  Medications  . fenofibrate (TRICOR) 145 MG tablet    Sig: Take 1 tablet (145 mg total) by mouth daily.    Dispense:  30 tablet  Refill:  3    Patient Instructions  Medication Instructions:   Your physician has recommended you make the following change in your medication:   1.  START taking Fenofibrate 145 MG once daily.  *If you need a refill on your cardiac medications before your next appointment, please call your pharmacy*   Lab Work:  Your physician recommends that you return for a FASTING lipid profile: in 6 weeks.  - Please go to the [facility]. You will check in at the front desk to the right as you walk into the atrium. Valet Parking is offered if needed. - No appointment needed. You may go any day between 7 am and 6 pm. - Please go to the [facility]. You will check in at the front desk to the right as you walk into the atrium. Valet Parking is offered if needed. - No appointment needed. You may go any day between 7 am and 6 pm.    Testing/Procedures:  Your physician has recommended that you wear a Zio monitor for 2 weeks. This monitor is a medical device that records the heart's electrical activity. Doctors most often use these monitors to diagnose arrhythmias. Arrhythmias are problems with the speed or rhythm of the heartbeat. The monitor is a small device applied to your chest. You can wear one while you do your normal daily activities. While wearing this monitor if you  have any symptoms to push the button and record what you felt. Once you have worn this monitor for the period of time provider prescribed (Usually 14 days), you will return the monitor device in the postage paid box. Once it is returned they will download the data collected and provide [place] with a report which the provider will then review and we will call you with those results. Important tips:  1. Avoid showering during the first 24 hours of wearing the monitor. 2. Avoid excessive sweating to help maximize wear time. 3. Do not submerge the device, no hot tubs, and no swimming pools. 4. Keep any lotions or oils away from the patch. 5. After 24 hours you may shower with the patch on. Take brief showers with your back facing the shower head.  6. Do not remove patch once it has been placed because that will interrupt data and decrease adhesive wear time. 7. Push the button when you have any symptoms and write down what you were feeling. 8. Once you have completed wearing your monitor, remove and place into box which has postage paid and place in your outgoing mailbox.  9. If for some reason you have misplaced your box then call our office and we can provide another box and/or mail it off for you.         Follow-Up: At [facility], you and your health needs are our priority.  As part of our continuing mission to provide you with exceptional heart care, we have created designated Provider Care Teams.  These Care Teams include your primary Cardiologist (physician) and Advanced Practice Providers (APPs -  Physician Assistants and Nurse Practitioners) who all work together to provide you with the care you need, when you need it.  We recommend signing up for the patient portal called "MyChart".  Sign up information is provided on this After Visit Summary.  MyChart is used to connect with patients for Virtual Visits (Telemedicine).  Patients are able to view lab/test results, encounter notes, upcoming  appointments, etc.  Non-urgent messages can be sent  to your provider as well.   To learn more about what you can do with MyChart, go to [email or web address].    Your next appointment:   6 week(s)  The format for your next appointment:   In Person  Provider:   [name], MD   Other Instructions      Signed, [name], MD  02/02/2021 11:21 AM    [place]

## 2021-02-02 NOTE — Patient Instructions (Signed)
Medication Instructions:   Your physician has recommended you make the following change in your medication:   1.  START taking Fenofibrate 145 MG once daily.  *If you need a refill on your cardiac medications before your next appointment, please call your pharmacy*   Lab Work:  Your physician recommends that you return for a FASTING lipid profile: in 6 weeks.  - Please go to the [facility]. You will check in at the front desk to the right as you walk into the atrium. Valet Parking is offered if needed. - No appointment needed. You may go any day between 7 am and 6 pm. - Please go to the [facility]. You will check in at the front desk to the right as you walk into the atrium. Valet Parking is offered if needed. - No appointment needed. You may go any day between 7 am and 6 pm.    Testing/Procedures:  Your physician has recommended that you wear a Zio monitor for 2 weeks. This monitor is a medical device that records the heart's electrical activity. Doctors most often use these monitors to diagnose arrhythmias. Arrhythmias are problems with the speed or rhythm of the heartbeat. The monitor is a small device applied to your chest. You can wear one while you do your normal daily activities. While wearing this monitor if you have any symptoms to push the button and record what you felt. Once you have worn this monitor for the period of time provider prescribed (Usually 14 days), you will return the monitor device in the postage paid box. Once it is returned they will download the data collected and provide [place] with a report which the provider will then review and we will call you with those results. Important tips:  1. Avoid showering during the first 24 hours of wearing the monitor. 2. Avoid excessive sweating to help maximize wear time. 3. Do not submerge the device, no hot tubs, and no swimming pools. 4. Keep any lotions or oils away from the patch. 5. After 24 hours you may  shower with the patch on. Take brief showers with your back facing the shower head.  6. Do not remove patch once it has been placed because that will interrupt data and decrease adhesive wear time. 7. Push the button when you have any symptoms and write down what you were feeling. 8. Once you have completed wearing your monitor, remove and place into box which has postage paid and place in your outgoing mailbox.  9. If for some reason you have misplaced your box then call our office and we can provide another box and/or mail it off for you.         Follow-Up: At [facility], you and your health needs are our priority.  As part of our continuing mission to provide you with exceptional heart care, we have created designated Provider Care Teams.  These Care Teams include your primary Cardiologist (physician) and Advanced Practice Providers (APPs -  Physician Assistants and Nurse Practitioners) who all work together to provide you with the care you need, when you need it.  We recommend signing up for the patient portal called "MyChart".  Sign up information is provided on this After Visit Summary.  MyChart is used to connect with patients for Virtual Visits (Telemedicine).  Patients are able to view lab/test results, encounter notes, upcoming appointments, etc.  Non-urgent messages can be sent to your provider as well.   To learn more  about what you can do with MyChart, go to [email or web address].    Your next appointment:   6 week(s)  The format for your next appointment:   In Person  Provider:   [name], MD   Other Instructions

## 2021-02-09 ENCOUNTER — Other Ambulatory Visit: Payer: Self-pay | Admitting: Nurse Practitioner

## 2021-02-09 DIAGNOSIS — R7309 Other abnormal glucose: Secondary | ICD-10-CM

## 2021-02-13 ENCOUNTER — Other Ambulatory Visit: Payer: Self-pay | Admitting: Registered Nurse

## 2021-02-13 DIAGNOSIS — I10 Essential (primary) hypertension: Secondary | ICD-10-CM

## 2021-02-13 NOTE — Telephone Encounter (Signed)
[place]on pt. Thanks.

## 2021-02-13 NOTE — Telephone Encounter (Signed)
ty

## 2021-02-14 ENCOUNTER — Other Ambulatory Visit: Payer: Self-pay | Admitting: Medical

## 2021-02-14 DIAGNOSIS — I10 Essential (primary) hypertension: Secondary | ICD-10-CM

## 2021-03-06 ENCOUNTER — Telehealth: Payer: Self-pay

## 2021-03-06 NOTE — Telephone Encounter (Signed)
Called the patient and [facility] with request to call back so I can give him his Zio results.

## 2021-03-06 NOTE — Telephone Encounter (Signed)
The patient has been notified of the result and verbalized understanding.  All questions (if any) were answered. Kavin Leech, RN 03/06/2021 11:45 AM

## 2021-03-09 ENCOUNTER — Other Ambulatory Visit
Admission: RE | Admit: 2021-03-09 | Discharge: 2021-03-09 | Disposition: A | Discharge status: Home / Self Care | Payer: BC Managed Care – PPO | Attending: Cardiology | Admitting: Cardiology

## 2021-03-09 ENCOUNTER — Other Ambulatory Visit: Payer: Self-pay

## 2021-03-09 DIAGNOSIS — E781 Pure hyperglyceridemia: Secondary | ICD-10-CM | POA: Diagnosis present

## 2021-03-09 LAB — LIPID PANEL
Cholesterol: 162 mg/dL (ref 0–200)
HDL: 28 mg/dL — ABNORMAL LOW (ref >40)
LDL Cholesterol: 79 mg/dL (ref 0–99)
Total CHOL/HDL Ratio: 5.8 RATIO
Triglycerides: 277 mg/dL — ABNORMAL HIGH (ref <150)
VLDL: 55 mg/dL — ABNORMAL HIGH (ref 0–40)

## 2021-03-16 ENCOUNTER — Ambulatory Visit: Payer: BC Managed Care – PPO | Admitting: Cardiology

## 2021-03-20 ENCOUNTER — Other Ambulatory Visit: Payer: Self-pay | Admitting: Medical

## 2021-03-26 ENCOUNTER — Other Ambulatory Visit: Payer: Self-pay | Admitting: Nurse Practitioner

## 2021-03-26 DIAGNOSIS — R7309 Other abnormal glucose: Secondary | ICD-10-CM

## 2021-04-02 ENCOUNTER — Other Ambulatory Visit: Payer: Self-pay | Admitting: Medical

## 2021-04-09 ENCOUNTER — Ambulatory Visit: Payer: BC Managed Care – PPO | Admitting: Cardiology

## 2021-04-09 ENCOUNTER — Encounter: Payer: Self-pay | Admitting: Cardiology

## 2021-04-09 ENCOUNTER — Other Ambulatory Visit: Payer: Self-pay

## 2021-04-09 ENCOUNTER — Telehealth: Payer: Self-pay | Admitting: Medical

## 2021-04-09 VITALS — BP 118/72 | HR 80 | Ht 71.0 in | Wt 220.0 lb

## 2021-04-09 DIAGNOSIS — I493 Ventricular premature depolarization: Secondary | ICD-10-CM

## 2021-04-09 DIAGNOSIS — I10 Essential (primary) hypertension: Secondary | ICD-10-CM | POA: Diagnosis not present

## 2021-04-09 DIAGNOSIS — E781 Pure hyperglyceridemia: Secondary | ICD-10-CM | POA: Diagnosis not present

## 2021-04-09 NOTE — Progress Notes (Signed)
   Subjective:    Patient ID: Cory Houston, male    DOB: 1956-05-05, 65 y.o.   MRN: [id number]  HPI  65 yo male in non acute distress consents to telemedicine appointment. 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  So his  his allergies were were worse when viisting, sneezing , coughing, congestion of nose. He was taking his allergy medication. He had taken 2 at home quick Covid-19 tests at home and both have been negative.  He returned home and all symptoms resolved except the cough. He currently is on an ace inhibitor which may be causing his cough.  Vaccinated and boosted.  Review of Systems All ROS is negative except a non productive cough.    Objective:   Physical Exam   AXOX3 No acute distress noted. Cough is noted on the phone call. No physical exam perfoirmed due to telemedicine appointment.     Assessment & Plan:  Cough, may be medication induced. Stop Ace inhibitor medication Ramipril x 2 weeks.  Monitor BP and document. Recheck by telemedicine or in person at 2 weeks If any concerns contact me sooner.

## 2021-04-09 NOTE — Progress Notes (Signed)
Cardiology Office Note:    Date:  04/09/2021   ID:  Cory, Houston 03-01-1956, MRN [id number]  PCP:  Ratcliffe, [name], [place]  Cardiologist:  [name], MD  Advanced Practice Provider:  No care team member to display Electrophysiologist:  None       Referring MD: Marcy Salvo*   Chief Complaint  Patient presents with  . Other    6 weel follow up post ZIO - Patient states that he has not felt any fluttering and thinks it could have been stress related. Meds reviewed verbally with patient.      History of Present Illness:    Cory Houston is a 65 y.o. male with a hx of hypertension, hypertriglyceridemia, diabetes presents for follow-up.  Previously seen due to palpitations and elevated triglycerides.  Cardiac monitor was placed, fenofibrate was started.  He now presents for results.  He states his symptoms of palpitations have overall improved.  Attributes symptoms to stress from studying for his masters program.  Feels well, denies chest pain or shortness of breath.  Tolerating fenofibrate without any adverse effects.   Past Medical History:  Diagnosis Date  . Allergy   . Arthritis   . Diabetes mellitus without complication ([place])   . GERD (gastroesophageal reflux disease)   . Hyperlipidemia   . Hypertension   . Sleep apnea     Past Surgical History:  Procedure Laterality Date  . VASECTOMY      Current Medications: Current Meds  Medication Sig  . amLODipine (NORVASC) 10 MG tablet TAKE 1 TABLET BY MOUTH EVERY DAY  . Cholecalciferol (VITAMIN D-3) 5000 units TABS Take 1 tablet by mouth every other day.   . fenofibrate (TRICOR) 145 MG tablet Take 1 tablet (145 mg total) by mouth daily.  Marland Kitchen JARDIANCE 10 MG TABS tablet TAKE 1 TABLET BY MOUTH EVERY DAY BEFORE BREAKFAST  . loratadine (CLARITIN) 10 MG tablet TAKE 1 TABLET BY MOUTH EVERY DAY  . metFORMIN (GLUCOPHAGE-XR) 500 MG 24 hr tablet TAKE 2 TABLETS BY MOUTH  TWICE A DAY  . Multiple Vitamin (MULTIVITAMIN) tablet Take 1 tablet by mouth daily.  . Omega-3 Fatty Acids (FISH OIL) 1200 MG CAPS Take 2,400 mg by mouth daily after breakfast. 1200 mg in the evening after supper  . omeprazole (PRILOSEC) 20 MG capsule TAKE 1 CAPSULE BY MOUTH EVERY DAY  . ramipril (ALTACE) 5 MG capsule TAKE 1 CAPSULE BY MOUTH DAILY PLEASE CALL OFFICE TO SCHEDULE LABS NOW & BLOOD PRESSURE RECHECK AFTER  . triamcinolone cream (KENALOG) 0.1 % Apply 1 application topically 2 (two) times daily. To affected area.     Allergies:   Codeine   Social History   Socioeconomic History  . Marital status: Married    Spouse name: Not on file  . Number of children: Not on file  . Years of education: Not on file  . Highest education level: Not on file  Occupational History  . Not on file  Tobacco Use  . Smoking status: Never Smoker  . Smokeless tobacco: Never Used  Vaping Use  . Vaping Use: Not on file  Substance and Sexual Activity  . Alcohol use: Yes    Comment: rarely 1 per month  . Drug use: No  . Sexual activity: Yes  Other Topics Concern  . Not on file  Social History Narrative  . Not on file   Social Determinants of Health   Financial Resource Strain: Not  on file  Food Insecurity: Not on file  Transportation Needs: Not on file  Physical Activity: Not on file  Stress: Not on file  Social Connections: Not on file     Family History: The patient's family history includes Cataracts in his sister; Congestive Heart Failure in his father and mother; Diabetes in his father and mother; Diverticulitis in his father; Heart attack in his maternal grandmother; Heart disease in his mother; Heart murmur in his son; Hypercholesterolemia in his father and mother; Hypertension in his father, mother, paternal aunt, and paternal uncle; Lung cancer in his maternal grandmother; Obesity in his mother; Stroke in his paternal grandfather and paternal grandmother.  ROS:   Please see the  history of present illness.     All other systems reviewed and are negative.  EKGs/Labs/Other Studies Reviewed:    The following studies were reviewed today:   EKG:  EKG is  ordered today.  The ekg ordered today demonstrates normal sinus rhythm, normal ECG.  Recent Labs: 10/09/2020: TSH 1.990 01/30/2021: ALT 54; BUN 19; Creatinine, Ser 1.01; Hemoglobin 17.4; Platelets 211; Potassium 4.4; Sodium 137  Recent Lipid Panel    Component Value Date/Time   CHOL 162 03/09/2021 0929   CHOL 189 01/30/2021 0805   TRIG 277 (H) 03/09/2021 0929   HDL 28 (L) 03/09/2021 0929   HDL 25 (L) 01/30/2021 0805   CHOLHDL 5.8 03/09/2021 0929   VLDL 55 (H) 03/09/2021 0929   LDLCALC 79 03/09/2021 0929   LDLCALC 104 (H) 01/30/2021 0805     Risk Assessment/Calculations:      Physical Exam:    VS:  BP 118/72 (BP Location: Left Arm, Patient Position: Sitting, Cuff Size: Normal)   Pulse 80   Ht 5\' 11"  (1.803 m)   Wt 220 lb (99.8 kg)   SpO2 97%   BMI 30.68 kg/m     Wt Readings from Last 3 Encounters:  04/09/21 220 lb (99.8 kg)  02/02/21 225 lb (102.1 kg)  01/30/21 226 lb 12.8 oz (102.9 kg)     GEN:  Well nourished, well developed in no acute distress HEENT: Normal NECK: No JVD; No carotid bruits LYMPHATICS: No lymphadenopathy CARDIAC: RRR, no murmurs, rubs, gallops RESPIRATORY:  Clear to auscultation without rales, wheezing or rhonchi  ABDOMEN: Soft, non-tender, non-distended MUSCULOSKELETAL:  No edema; No deformity  SKIN: Warm and dry NEUROLOGIC:  Alert and oriented x 3 PSYCHIATRIC:  Normal affect   ASSESSMENT:    1. PVC's (premature ventricular contractions)   2. Pure hypertriglyceridemia   3. Primary hypertension    PLAN:    In order of problems listed above:  1. Palpitations, cardiac monitor with frequent PVCs, occasional paroxysmal SVTs.  PVC burden 2.3%.  Patient states symptoms are overall improved, will monitor patient for now.  If symptoms persist or recur, will consider  beta-blocker. 2. History of hypertriglyceridemia.  Continue fenofibrate.  Repeat lipid panel in 3 months. 3. Hypertension, BP controlled.  Continue Norvasc and ramipril.  Follow-up in 3 months after repeat fasting lipid profile.     Medication Adjustments/Labs and Tests Ordered: Current medicines are reviewed at length with the patient today.  Concerns regarding medicines are outlined above.  Orders Placed This Encounter  Procedures  . Lipid panel   No orders of the defined types were placed in this encounter.   Patient Instructions  Medication Instructions:   Your physician recommends that you continue on your current medications as directed. Please refer to the Current Medication list given to  you today.  *If you need a refill on your cardiac medications before your next appointment, please call your pharmacy*   Lab Work:  Your physician recommends that you return for a FASTING lipid profile: in 3 months just prior to your follow up appointment.    Testing/Procedures: None ordered   Follow-Up: At [facility], you and your health needs are our priority.  As part of our continuing mission to provide you with exceptional heart care, we have created designated Provider Care Teams.  These Care Teams include your primary Cardiologist (physician) and Advanced Practice Providers (APPs -  Physician Assistants and Nurse Practitioners) who all work together to provide you with the care you need, when you need it.  We recommend signing up for the patient portal called "MyChart".  Sign up information is provided on this After Visit Summary.  MyChart is used to connect with patients for Virtual Visits (Telemedicine).  Patients are able to view lab/test results, encounter notes, upcoming appointments, etc.  Non-urgent messages can be sent to your provider as well.   To learn more about what you can do with MyChart, go to [email or web address].    Your next appointment:   3  month(s)  The format for your next appointment:   In Person  Provider:   [name], MD   Other Instructions      Signed, [name], MD  04/09/2021 11:03 AM    [place]

## 2021-04-09 NOTE — Patient Instructions (Addendum)
Medication Instructions:   Your physician recommends that you continue on your current medications as directed. Please refer to the Current Medication list given to you today.  *If you need a refill on your cardiac medications before your next appointment, please call your pharmacy*   Lab Work:  Your physician recommends that you return for a FASTING lipid profile: in 3 months just prior to your follow up appointment.    Testing/Procedures: None ordered   Follow-Up: At [facility], you and your health needs are our priority.  As part of our continuing mission to provide you with exceptional heart care, we have created designated Provider Care Teams.  These Care Teams include your primary Cardiologist (physician) and Advanced Practice Providers (APPs -  Physician Assistants and Nurse Practitioners) who all work together to provide you with the care you need, when you need it.  We recommend signing up for the patient portal called "MyChart".  Sign up information is provided on this After Visit Summary.  MyChart is used to connect with patients for Virtual Visits (Telemedicine).  Patients are able to view lab/test results, encounter notes, upcoming appointments, etc.  Non-urgent messages can be sent to your provider as well.   To learn more about what you can do with MyChart, go to [email or web address].    Your next appointment:   3 month(s)  The format for your next appointment:   In Person  Provider:   [name], MD   Other Instructions

## 2021-04-23 ENCOUNTER — Telehealth: Payer: Self-pay | Admitting: Medical

## 2021-04-23 ENCOUNTER — Other Ambulatory Visit: Payer: Self-pay

## 2021-04-23 DIAGNOSIS — E559 Vitamin D deficiency, unspecified: Secondary | ICD-10-CM

## 2021-04-23 DIAGNOSIS — E781 Pure hyperglyceridemia: Secondary | ICD-10-CM

## 2021-04-23 DIAGNOSIS — E119 Type 2 diabetes mellitus without complications: Secondary | ICD-10-CM

## 2021-04-23 DIAGNOSIS — Z8639 Personal history of other endocrine, nutritional and metabolic disease: Secondary | ICD-10-CM

## 2021-04-23 MED ORDER — METFORMIN HCL ER 500 MG PO TB24: 1000.0000 mg | ORAL_TABLET | Freq: Two times a day (BID) | ORAL | 1 refills | Status: DC

## 2021-04-23 NOTE — Progress Notes (Signed)
   Subjective:    Patient ID: [name], male    DOB: [birth year], 65 y.o.   MRN: [id number]  HPI 65 yo male in non acute distress consents to telemedicine appointment. I called patient to see how his blood pressure was doing since  stopping the ACE-Inhibitor because of cough side effect.  The patient stopped the medication and the cough resolved. He then tried the medication again and his cough came back. At this point he  " believed " in my advice.  He states he has been checking his blood pressure and it has been 120-130's/ 80-85.  With no side effects.  He does mention he needs to stay off HCTZ due to frequency of urination and difficulty with doing his job at [employer].  There were no vitals taken for this visit. Allergies  Allergen Reactions   Codeine Anaphylaxis      Review of Systems  Constitutional:  Negative for chills, fatigue and fever.  HENT:  Negative for congestion, ear pain and sore throat.   Respiratory:  Negative for cough and shortness of breath.   Cardiovascular:  Negative for chest pain.  Gastrointestinal:  Negative for abdominal pain and diarrhea.  Neurological:  Negative for dizziness, syncope, light-headedness and headaches.  Hematological:  Does not bruise/bleed easily.      Objective:   Physical Exam  AXOX3 No physical exam performed due to telemedicine appointment. No acute distress      Assessment & Plan:  Hypertriglyceridemia  improved LDL and HDL, on Fenofibrate. Diabetes Mellitus type 2 on Jardiance and Metformin Rechecked by cardiaology  In 3 months. Vit D deficiency 2000 IU/day. Return to clinic in late August for lab work. Patient verbalizes understanding and has no questions at discharge.

## 2021-04-25 ENCOUNTER — Other Ambulatory Visit: Payer: Self-pay | Admitting: Medical

## 2021-04-25 DIAGNOSIS — R7309 Other abnormal glucose: Secondary | ICD-10-CM

## 2021-05-14 ENCOUNTER — Other Ambulatory Visit: Payer: Self-pay | Admitting: Medical

## 2021-05-14 DIAGNOSIS — I10 Essential (primary) hypertension: Secondary | ICD-10-CM

## 2021-05-25 ENCOUNTER — Other Ambulatory Visit: Payer: Self-pay | Admitting: Nurse Practitioner

## 2021-05-25 DIAGNOSIS — R7309 Other abnormal glucose: Secondary | ICD-10-CM

## 2021-05-28 ENCOUNTER — Other Ambulatory Visit: Payer: Self-pay | Admitting: *Deleted

## 2021-05-28 MED ORDER — FENOFIBRATE 145 MG PO TABS: 145.0000 mg | ORAL_TABLET | Freq: Every day | ORAL | 0 refills | Status: DC

## 2021-06-13 ENCOUNTER — Encounter: Payer: Self-pay | Admitting: Medical

## 2021-06-13 ENCOUNTER — Other Ambulatory Visit: Payer: Self-pay | Admitting: Medical

## 2021-06-13 DIAGNOSIS — I10 Essential (primary) hypertension: Secondary | ICD-10-CM

## 2021-06-25 ENCOUNTER — Other Ambulatory Visit: Payer: Self-pay

## 2021-06-25 MED ORDER — FENOFIBRATE 145 MG PO TABS: 145.0000 mg | ORAL_TABLET | Freq: Every day | ORAL | 0 refills | Status: DC

## 2021-06-28 ENCOUNTER — Other Ambulatory Visit: Payer: Self-pay | Admitting: Medical

## 2021-06-28 ENCOUNTER — Encounter: Payer: Self-pay | Admitting: Medical

## 2021-06-28 DIAGNOSIS — E119 Type 2 diabetes mellitus without complications: Secondary | ICD-10-CM

## 2021-06-28 DIAGNOSIS — R7309 Other abnormal glucose: Secondary | ICD-10-CM

## 2021-06-28 NOTE — Progress Notes (Signed)
Will check  A1C, Met C and  on patient refill of Jardiance ordered

## 2021-07-07 ENCOUNTER — Emergency Department: Payer: No Typology Code available for payment source

## 2021-07-07 ENCOUNTER — Encounter: Payer: Self-pay | Admitting: Physician Assistant

## 2021-07-07 ENCOUNTER — Emergency Department
Admission: EM | Admit: 2021-07-07 | Discharge: 2021-07-07 | Disposition: A | Discharge status: Home / Self Care | Payer: No Typology Code available for payment source | Attending: Emergency Medicine | Admitting: Emergency Medicine

## 2021-07-07 DIAGNOSIS — R0789 Other chest pain: Secondary | ICD-10-CM | POA: Insufficient documentation

## 2021-07-07 DIAGNOSIS — E1169 Type 2 diabetes mellitus with other specified complication: Secondary | ICD-10-CM | POA: Diagnosis not present

## 2021-07-07 DIAGNOSIS — Z79899 Other long term (current) drug therapy: Secondary | ICD-10-CM | POA: Insufficient documentation

## 2021-07-07 DIAGNOSIS — W1809XA Striking against other object with subsequent fall, initial encounter: Secondary | ICD-10-CM | POA: Insufficient documentation

## 2021-07-07 DIAGNOSIS — Y9289 Other specified places as the place of occurrence of the external cause: Secondary | ICD-10-CM | POA: Insufficient documentation

## 2021-07-07 DIAGNOSIS — W19XXXA Unspecified fall, initial encounter: Secondary | ICD-10-CM

## 2021-07-07 DIAGNOSIS — Z7984 Long term (current) use of oral hypoglycemic drugs: Secondary | ICD-10-CM | POA: Insufficient documentation

## 2021-07-07 DIAGNOSIS — M546 Pain in thoracic spine: Secondary | ICD-10-CM | POA: Diagnosis present

## 2021-07-07 DIAGNOSIS — I10 Essential (primary) hypertension: Secondary | ICD-10-CM | POA: Insufficient documentation

## 2021-07-07 MED ORDER — METHOCARBAMOL 500 MG PO TABS: 500.0000 mg | ORAL_TABLET | Freq: Three times a day (TID) | ORAL | 0 refills | Status: DC | PRN

## 2021-07-07 MED ORDER — IBUPROFEN 800 MG PO TABS
800.0000 mg | ORAL_TABLET | Freq: Once | ORAL | Status: AC
  Administered 2021-07-07: 800 mg via ORAL
  Filled 2021-07-07: qty 1

## 2021-07-07 MED ORDER — IBUPROFEN 600 MG PO TABS: 600.0000 mg | ORAL_TABLET | Freq: Three times a day (TID) | ORAL | 0 refills | Status: DC | PRN

## 2021-07-07 NOTE — Discharge Instructions (Addendum)
You were seen today for sternal pain and acute back pain status post fall.  Your x-rays do not show any signs of fracture.  This is likely muscular in origin.  We recommend rest and stretching.  I have given you prescription for ibuprofen to take every 8 hours as needed with food and a muscle relaxer to take every 8 hours as needed but be aware that this may cause sedation.  Follow-up with your PCP if symptoms persist.

## 2021-07-07 NOTE — ED Provider Notes (Signed)
[name] Emergency Department Provider Note ____________________________________________  Time seen: 1800  I have reviewed the triage vital signs and the nursing notes.  HISTORY  Chief Complaint  Fall (Pt present to the ED with complaints of fall hitting head and back from standing height. Pt reports no LOC and states that when he fell back he hit a forklift causing him to have pain in his ribs. )   HPI Cory Houston is a 65 y.o. male presents to the ER via EMS in a collar with complaint of a fall.  He reports he he was aerating the lawn at work when a tractor with a large bucket ran into his machine causing him to fall backwards on his back.  He thinks he hit his head but did not lose consciousness.  He reports thoracic back pain and lower sternal pain that is worse with movement or deep breathing.  The pain does not radiate.  He denies headache, dizziness, visual changes, chest tightness or shortness of breath.  He reports he did not take any medication PTA.  Past Medical History:  Diagnosis Date   Allergy    Arthritis    Diabetes mellitus without complication ([name])    GERD (gastroesophageal reflux disease)    Hyperlipidemia    Hypertension    Sleep apnea     Patient Active Problem List   Diagnosis Date Noted   Noncompliance with CPAP treatment 01/09/2018   Prediabetes 01/31/2017   Hypertension 01/31/2017    Past Surgical History:  Procedure Laterality Date   VASECTOMY      Prior to Admission medications   Medication Sig Start Date End Date Taking? Authorizing Provider  ibuprofen (ADVIL) 600 MG tablet Take 1 tablet (600 mg total) by mouth every 8 (eight) hours as needed. 07/07/21  Yes Merrillyn Ackerley, Coralie Keens, NP  methocarbamol (ROBAXIN) 500 MG tablet Take 1 tablet (500 mg total) by mouth every 8 (eight) hours as needed for muscle spasms. 07/07/21  Yes Kimberlie Csaszar, Coralie Keens, NP  amLODipine (NORVASC) 10 MG tablet TAKE 1 TABLET BY MOUTH EVERY DAY 02/14/21   Ratcliffe,  [name], PA-C  Cholecalciferol (VITAMIN D-3) 5000 units TABS Take 1 tablet by mouth every other day.     [provider]  fenofibrate (TRICOR) 145 MG tablet Take 1 tablet (145 mg total) by mouth daily. 06/25/21   [name], MD  JARDIANCE 10 MG TABS tablet TAKE 1 TABLET BY MOUTH EVERY DAY BEFORE BREAKFAST 06/28/21   Ratcliffe, [name], PA-C  loratadine (CLARITIN) 10 MG tablet TAKE 1 TABLET BY MOUTH EVERY DAY 03/20/21   Ratcliffe, [name], PA-C  metFORMIN (GLUCOPHAGE-XR) 500 MG 24 hr tablet Take 2 tablets (1,000 mg total) by mouth 2 (two) times daily. 04/23/21   Ratcliffe, [name], PA-C  Multiple Vitamin (MULTIVITAMIN) tablet Take 1 tablet by mouth daily.    [provider]  Omega-3 Fatty Acids (FISH OIL) 1200 MG CAPS Take 2,400 mg by mouth daily after breakfast. 1200 mg in the evening after supper    [provider]  omeprazole (PRILOSEC) 20 MG capsule TAKE 1 CAPSULE BY MOUTH EVERY DAY 11/23/20   [name], FNP  ramipril (ALTACE) 5 MG capsule TAKE 1 CAPSULE BY MOUTH DAILY PLEASE CALL OFFICE TO SCHEDULE LABS NOW & BLOOD PRESSURE RECHECK AFTER 06/13/21   [name], PA-C  triamcinolone cream (KENALOG) 0.1 % Apply 1 application topically 2 (two) times daily. To affected area. 05/17/19   [name], PA-C  Allergies Codeine  Family History  Problem Relation Age of Onset   Diabetes Mother    Obesity Mother    Hypertension Mother    Heart disease Mother    Congestive Heart Failure Mother    Hypercholesterolemia Mother    Diabetes Father    Congestive Heart Failure Father         cardiopulmonary edema.- Father 7 deceased from complications.    Diverticulitis Father    Hypertension Father    Hypercholesterolemia Father    Cataracts Sister    Heart murmur Son    Hypertension Paternal Aunt    Hypertension Paternal Uncle    Heart attack Maternal Grandmother    Lung cancer Maternal Grandmother    Stroke Paternal Grandmother    Stroke  Paternal Grandfather     Social History Social History   Tobacco Use   Smoking status: Never   Smokeless tobacco: Never  Substance Use Topics   Alcohol use: Yes    Comment: rarely 1 per month   Drug use: No    Review of Systems  Constitutional: Negative for fever, chills or. Eyes: Negative for visual changes. ENT: Negative for sore throat. Cardiovascular: Negative for chest pain or chest tightness. Respiratory: Negative for shortness of breath. Gastrointestinal: Negative for abdominal pain. Musculoskeletal: Positive for sternal pain and thoracic back pain.  Negative for neck pain. Skin: Negative for bruising or abrasion. Neurological: Negative for headaches, dizziness, focal weakness, tingling or numbness. ____________________________________________  PHYSICAL EXAM:  VITAL SIGNS: ED Triage Vitals  Enc Vitals Group     BP 07/07/21 1514 136/72     Pulse Rate 07/07/21 1514 72     Resp 07/07/21 1514 18     Temp 07/07/21 1514 98.1 F (36.7 C)     Temp Source 07/07/21 1514 Oral     SpO2 07/07/21 1514 99 %     Weight 07/07/21 1515 225 lb (102.1 kg)     Height 07/07/21 1515 '5\' 11"'$  (1.803 m)     Head Circumference --      Peak Flow --      Pain Score --      Pain Loc --      Pain Edu? --      Excl. in [place]? --     Constitutional: Alert and oriented. Well appearing and in no distress. Head: Normocephalic and atraumatic. Eyes:  Normal extraocular movements Cardiovascular: Normal rate, regular rhythm.  Radial pulses 2+ bilaterally. Respiratory: Normal respiratory effort. No wheezes/rales/rhonchi. Gastrointestinal: Soft and nontender.  Musculoskeletal: Hard collar removed.  Normal flexion, extension, rotation to the left and lateral bending of the cervical spine.  No bony tenderness noted over the cervical spine.  Bony tenderness noted over the thoracic spine.  No bony tenderness or step-off noted of the sternum.  Shoulder shrug equal.  Strength 5/5 BUE.  Handgrips  equal. Neurologic:  Normal gait without ataxia. Normal speech and language. No gross focal neurologic deficits are appreciated. Skin: Redness noted over the thoracic spine and between the shoulder blades.  No abrasion or bruising noted.  ____________________________________________   RADIOLOGY  Imaging Orders         DG Chest 2 View         DG Thoracic Spine 2 View    IMPRESSION: No fracture or acute finding.    IMPRESSION: No active cardiopulmonary disease.  ____________________________________________   INITIAL IMPRESSION / ASSESSMENT AND PLAN / ED COURSE  Acute Thoracic Back Pain, Sternal Pain status post Fall:  DDx include thoracic spine fracture, sternal fracture, muscular thoracic back and chest wall pain Chest x-ray negative for sternal fracture Thoracic x-ray negative for acute findings He declines pain medication here in the ER. Advised him that this is likely just muscular in origin and he may be sore for the next few days Rx for Ibuprofen 600 mg every 8 hours as needed with food Rx for Methocarbamol 500 mg every 8 hours as needed-sedation caution given Encouraged heat and stretching Work note provided      ____________________________________________  FINAL CLINICAL IMPRESSION(S) / ED DIAGNOSES  Final diagnoses:  Acute bilateral thoracic back pain  Sternal pain  Fall, initial encounter      Jearld Fenton, NP 07/07/21 1914    Nance Pear, MD 07/07/21 2005

## 2021-07-10 ENCOUNTER — Ambulatory Visit: Payer: Self-pay | Admitting: Medical

## 2021-07-10 ENCOUNTER — Ambulatory Visit: Payer: BC Managed Care – PPO | Admitting: Cardiology

## 2021-07-10 ENCOUNTER — Other Ambulatory Visit: Payer: Self-pay

## 2021-07-10 VITALS — BP 140/76 | HR 78 | Temp 97.5°F | Resp 16 | Wt 228.0 lb

## 2021-07-10 DIAGNOSIS — S20229A Contusion of unspecified back wall of thorax, initial encounter: Secondary | ICD-10-CM

## 2021-07-10 DIAGNOSIS — Z026 Encounter for examination for insurance purposes: Secondary | ICD-10-CM

## 2021-07-10 NOTE — Progress Notes (Signed)
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 Mini loader then kept moving and hit patient again in the back and head.   Provider Note Patient was on a standing aerator and a mini loader with  a bucket hit the aerator that then  jtwisted machine and the patient fell off the aerator. And patient then hit his back twice on the mini loader ( at the back of the machine) and hit his head. He did not lose consciousness. An EMT (an Production manager) was near by and came and did a quick assessment, patient was Alert and oriented.  He says he then tried to sit up and at the 45 degree angle he had severe back and chest pain with tightness. He was then transported to [facility]  Emergency Department by ambulance. For further evaluation.He denies any numbness or tingling or radiation of pain  at the back,  no neck or down upper or lower extremities pain at this time. He has no complaints of head or neck or back pain today. No memory changes. Nontender on back and cervical area with palpation. No bruising or swelling noted on head, neck or back. He easily gets up from exam table and gait is wnl. Able to move upper and lower extremities without difficulty.lungs:  clear to auscultation and cardiac: rate rhythm are regular, no murmurs, rubs or gallops. No bruising or soreness on head.  Diagnosis  Contusion to back Has been taking prescribed medication Robaxin and Ibuprofen '600mg'$  every 5-6 hours ( prescribed by [facility] Emergency Department.) last dose  4:30 am.     Medications Prescribed  No orders placed    Referred to Return to clinic on Monday 07/16/21 for reevaluation   Return to Work Status He may return to work today. With the following restrictions. No lifting more then 20  lbs, no pushing , pulling or use of vibratory equipment. I cautioned patient on the use of muscle relaxers and sedation. He verbalizes understanding an has no questions at discharge.   Provider Signature ________________________________________Date_________   Employee Signature _______________________________________Date_________   Please email this completed form to [name], Director of Risk Management at [email or web address] within 24 hours of visit.

## 2021-07-16 ENCOUNTER — Other Ambulatory Visit: Payer: Self-pay

## 2021-07-16 ENCOUNTER — Ambulatory Visit: Payer: Self-pay | Admitting: Medical

## 2021-07-16 DIAGNOSIS — M62830 Muscle spasm of back: Secondary | ICD-10-CM

## 2021-07-16 DIAGNOSIS — Z026 Encounter for examination for insurance purposes: Secondary | ICD-10-CM

## 2021-07-16 MED ORDER — METHOCARBAMOL 500 MG PO TABS: 500.0000 mg | ORAL_TABLET | Freq: Three times a day (TID) | ORAL | 1 refills | Status: DC | PRN

## 2021-07-16 NOTE — Patient Instructions (Signed)
Methocarbamol tablets What is this medication? METHOCARBAMOL (meth oh KAR ba mole) helps to relieve pain and stiffness inmuscles caused by strains, sprains, or other injury to your muscles. This medicine may be used for other purposes; ask your health care provider orpharmacist if you have questions. COMMON BRAND NAME(S): Robaxin What should I tell my care team before I take this medication? They need to know if you have any of these conditions: kidney disease seizures an unusual or allergic reaction to methocarbamol, other medicines, foods, dyes, or preservatives pregnant or trying to get pregnant breast-feeding How should I use this medication? Take this medicine by mouth with a full glass of water. Follow the directions on the prescription label. Take your medicine at regular intervals. Do not takeyour medicine more often than directed. Talk to your pediatrician regarding the use of this medicine in children.Special care may be needed. Overdosage: If you think you have taken too much of this medicine contact apoison control center or emergency room at once. NOTE: This medicine is only for you. Do not share this medicine with others. What if I miss a dose? If you miss a dose, take it as soon as you can. If it is almost time for yournext dose, take only the next dose. Do not take double or extra doses. What may interact with this medication? Do not take this medication with any of the following medicines: narcotic medicines for cough This medicine may also interact with the following medications: alcohol antihistamines for allergy, cough and cold certain medicines for anxiety or sleep certain medicines for depression like amitriptyline, fluoxetine, sertraline certain medicines for seizures like phenobarbital, primidone cholinesterase inhibitors like neostigmine, ambenonium, and pyridostigmine bromide general anesthetics like halothane, isoflurane, methoxyflurane, propofol local  anesthetics like lidocaine, pramoxine, tetracaine medicines that relax muscles for surgery narcotic medicines for pain phenothiazines like chlorpromazine, mesoridazine, prochlorperazine, thioridazine This list may not describe all possible interactions. Give your health care provider a list of all the medicines, herbs, non-prescription drugs, or dietary supplements you use. Also tell them if you smoke, drink alcohol, or use illegaldrugs. Some items may interact with your medicine. What should I watch for while using this medication? Tell your doctor or health care professional if your symptoms do not start toget better or if they get worse. You may get drowsy or dizzy. Do not drive, use machinery, or do anything that needs mental alertness until you know how this medicine affects you. Do not stand or sit up quickly, especially if you are an older patient. This reduces the risk of dizzy or fainting spells. Alcohol may interfere with the effect ofthis medicine. Avoid alcoholic drinks. If you are taking another medicine that also causes drowsiness, you may have more side effects. Give your health care provider a list of all medicines you use. Your doctor will tell you how much medicine to take. Do not take more medicine than directed. Call emergency for help if you have problems breathingor unusual sleepiness. What side effects may I notice from receiving this medication? Side effects that you should report to your doctor or health care professionalas soon as possible: allergic reactions like skin rash, itching or hives, swelling of the face, lips, or tongue breathing problems confusion seizures unusually weak or tired Side effects that usually do not require medical attention (report to yourdoctor or health care professional if they continue or are bothersome): dizziness headache metallic taste tiredness upset stomach This list may not describe all possible side effects. Call your doctor  for  medical advice about side effects. You may report side effects to FDA at1-800-FDA-1088. Where should I keep my medication? Keep out of the reach of children. Store at room temperature between 20 and 25 degrees C (68 and 77 degrees F). Keep container tightly closed. Throw away any unused medicine after theexpiration date. NOTE: This sheet is a summary. It may not cover all possible information. If you have questions about this medicine, talk to your doctor, pharmacist, orhealth care provider.  2022 Elsevier/Gold Standard (2015-08-15 13:11:54)

## 2021-07-16 NOTE — Progress Notes (Signed)
Patient ID: Cory Houston, male   DOB: Feb 16, 1956, 65 y.o.   MRN: [id number] [facility] [address]  [place], [place] [address] Phone [phone] Fax  [phone]  Worker's Compensation Report Form   Cory Houston Date of Birth:August 02, 2056 Body Part Injured:mid back  Vital Signs There were no vitals taken for this visit.  Injury Description  fall   Provider Note HPI 65 yo male in non acute distress, consents to telemedicine appointment. [facility] up from [facility]. Patient states he wakes up in the morning with spasm in the mornings. Once he gets going he feels better. He is taking the Robaxin '500mg'$  at night to help with the muscle spasms. He denies any pain radiating down his arms or legs, no numbness or tingling, no loss of bowel or bladder. Over all he feels much better.    Review of Systems See HPI    Objective:   Physical Exam  AXOX3 No distress noted on phone call. No physical performed due to telemedicine appointment.        Assessment & Plan:   Diagnosis  Back spasm  Medications Prescribed  Meds ordered this encounter  Medications   methocarbamol (ROBAXIN) 500 MG tablet    Sig: Take 1 tablet (500 mg total) by mouth every 8 (eight) hours as needed for muscle spasms.    Dispense:  30 tablet    Refill:  1   Return to clinic on Monday September 5th 9am    Return to Work Status See work note.yes return to work w/ restrictions.   Provider Signature ________________________________________Date_________   Employee Signature _______________________________________Date_________   Please email this completed form to [name], Director of Risk Management at [email or web address] within 24 hours of visit.

## 2021-07-23 ENCOUNTER — Ambulatory Visit: Payer: Self-pay | Admitting: Medical

## 2021-07-23 ENCOUNTER — Other Ambulatory Visit: Payer: Self-pay

## 2021-07-23 ENCOUNTER — Encounter: Payer: Self-pay | Admitting: Medical

## 2021-07-23 ENCOUNTER — Other Ambulatory Visit: Payer: Self-pay | Admitting: Medical

## 2021-07-23 VITALS — BP 133/76 | HR 65 | Temp 98.2°F | Resp 16 | Ht 71.0 in | Wt 228.0 lb

## 2021-07-23 DIAGNOSIS — S20229A Contusion of unspecified back wall of thorax, initial encounter: Secondary | ICD-10-CM

## 2021-07-23 DIAGNOSIS — Z026 Encounter for examination for insurance purposes: Secondary | ICD-10-CM

## 2021-07-23 NOTE — Progress Notes (Signed)
Patient ID: Cory Houston, male   DOB: 1956/06/07, 65 y.o.   MRN: [id number]  [facility] [address]  [place], [place] [address] Phone [phone] Fax  [phone]  Worker's Compensation Report Form   [place] Date of Birth:2055-12-20 Phone [place] Supervisor Notified:yes  Date of Injury:07/07/2021 Time of Injury: Shift Worked:[employer] on a Saturday Body Part Injured:right side lower back  Vital Signs Blood pressure 133/76, pulse 65, temperature 98.2 F (36.8 C), temperature source Oral, resp. rate 16, height '5\' 11"'$  (1.803 m), weight 228 lb (103.4 kg), SpO2 98 %.   Injury Description   Worker compensation appointment Right lower back   Provider Note  2 weeks and  2 days  since injury, no numbness or tingling ,no pain radiating down arms or legs, no loss of bowel or bladder, no weakness in arms or legs.  Right side pain lower back above belt line is the location of pain..  This location was the the second area hit,while in mini loader was in motion, ;he hit the bumper the second time. No pain on breathing. Pain with lifting items only. Rotation to the right causes some pain., sometimes at night he takes the Advil at night.'400mg'$ . taking Robaxin in the morning and the evening. Wakes in morning with muscle spasm. Upper and lower extremity strength  5/5 bilaterally, able to flex trunk and touch the top of his shoes.   Diagnosis  Contusion to  lower back right side,   Medications Prescribed  No orders of the defined types were placed in this encounter.   Referred to   Clinic next week. 07/30/21 9am   Return to Work Status  Okay to [employer], pushing and pulling is okay, however lifting is painful still. Limited lifting not more than 20lbs.  Provider Signature ________________________________________Date_________   Employee Signature  _______________________________________Date_________   Please email this completed form to [name], Director of Risk Management at [email or web address] within 24 hours of visit.

## 2021-07-23 NOTE — Progress Notes (Deleted)
Patient ID: Cory Houston, male   DOB: [birth year], 65 y.o.   MRN: [id number]   [facility] [address]  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 [place]  Body Part Injured: Back/Head  Vital Signs There were no vitals taken for this visit.  Injury Description  On 07/07/2021 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 Mini loader then kept moving and hit patient again in the back and head.   Provider Note ***   Diagnosis Back contusion  Medications Prescribed  No orders of the defined types were placed in this encounter.   Referred to ***    Return to Work Status ***   Building control surveyor ________________________________________Date_________   Academic librarian _______________________________________Date_________   Please email this completed form to [name], Director of Risk Management at [email or web address] within 24 hours of visit.

## 2021-07-27 ENCOUNTER — Other Ambulatory Visit: Payer: Self-pay | Admitting: *Deleted

## 2021-07-27 MED ORDER — FENOFIBRATE 145 MG PO TABS: 145.0000 mg | ORAL_TABLET | Freq: Every day | ORAL | 0 refills | Status: DC

## 2021-07-30 ENCOUNTER — Encounter: Payer: Self-pay | Admitting: Medical

## 2021-07-30 ENCOUNTER — Other Ambulatory Visit: Payer: Self-pay

## 2021-07-30 ENCOUNTER — Ambulatory Visit: Payer: Self-pay | Admitting: Medical

## 2021-07-30 VITALS — BP 124/80 | HR 70 | Temp 97.9°F | Resp 16 | Wt 228.0 lb

## 2021-07-30 DIAGNOSIS — S20229A Contusion of unspecified back wall of thorax, initial encounter: Secondary | ICD-10-CM

## 2021-07-30 DIAGNOSIS — Z026 Encounter for examination for insurance purposes: Secondary | ICD-10-CM

## 2021-07-30 NOTE — Progress Notes (Addendum)
Patient ID: Cory Houston, male   DOB: 04/29/1956, 65 y.o.   MRN: [id number] [facility] [address]  [place], [place] [address] Phone [phone] Fax  [phone]  Worker's Compensation Report Form   Vahe Swarey Date of [id number] Phone Number::[phone] Email:[email or web address] [place] [employer] Supervisor Supervisor:[name] Orthoptist Notified:Y   Date of Injury:07/07/2021 Time of Injury:14:15 Shift Worked:[employer]  Location where injury occurred (address or landmark):[employer]   Body Part Injured:Back/Head   Vital Signs Vital Signs  Row Name 07/30/21 0911        Vital Signs    Temp 97.9 F (36.6 C)        Temp src Tympanic        Pulse 70        Resp 16        BP 124/80        BP Location Left Arm                 Oxygen Therapy    SpO2 99 %          Injury Description  07/30/21 WC follow up. Patient reports feeling much improved. He still has a little right lower back tightness after sitting for extended periods, but once he's up and active and moving, he feels fine. He's taking Robaxin BID. Once in the morning to help with stiffness after sleeping overnight and then then before bed because after he gets home from work he sits for awhile he gets some stiffness in his back.   Provider Note Patient in non acute distress, feeling well, thinks he can return to full duty work. Stiff in the morning and then takes muscle relaxer, and feels better at about  10 am.  He then takes one after work due to stiffness. no numbness or tingling , no pain radiating into an arm or leg,no loss of bowel or bladder. No pain. No weakness. FROM, non tender to palpation of back. 5/5 equal bilateral grips.5/5 strength of upper and lower extremities.gait wnl.   Diagnosis  Contusion of  back resolved.  Medications Prescribed  Still using muscle relaxer Robaxin. Recommend  massage chair and massage.  Referred to return to clinic if any concerns or pain.    Return to Work Status May return to work full duty work note completed.   Provider Signature ________________________________________Date_________   Employee Signature _______________________________________Date_________   Please email this completed form to [name], Director of Risk Management at [email or web address] within 24 hours of visit.

## 2021-08-09 ENCOUNTER — Ambulatory Visit: Payer: Self-pay | Admitting: Nurse Practitioner

## 2021-08-09 ENCOUNTER — Other Ambulatory Visit: Payer: Self-pay

## 2021-08-09 ENCOUNTER — Telehealth: Payer: Self-pay | Admitting: Cardiology

## 2021-08-09 DIAGNOSIS — Z7689 Persons encountering health services in other specified circumstances: Secondary | ICD-10-CM

## 2021-08-09 NOTE — Progress Notes (Signed)
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   As patient's stress increased in the fall of this year he has felt the return of PVC feelings, has a scheduled follow up with cardiology next month.  Encouraged patient to call cardiology for earlier appointment since he is feeling symptoms return- he will call back to ESW if unable to see cardiology  Current Outpatient Medications  Medication Instructions   amLODipine (NORVASC) 10 MG tablet TAKE 1 TABLET BY MOUTH EVERY DAY   Cholecalciferol (VITAMIN D-3) 5000 units TABS 1 tablet, Oral, Every other day   fenofibrate (TRICOR) 145 mg, Oral, Daily, No Further Refills Until Seen In Clinic   Fish Oil 2,400 mg, Oral, Daily after breakfast, 1200 mg in the evening after supper    ibuprofen (ADVIL) 600 mg, Oral, Every 8 hours PRN   JARDIANCE 10 MG TABS tablet TAKE 1 TABLET BY MOUTH EVERY DAY BEFORE BREAKFAST   loratadine (CLARITIN) 10 MG tablet TAKE 1 TABLET BY MOUTH EVERY DAY   metFORMIN (GLUCOPHAGE-XR) 1,000 mg, Oral, 2 times daily   methocarbamol (ROBAXIN) 500 mg, Oral, Every 8 hours PRN   Multiple Vitamin (MULTIVITAMIN) tablet 1 tablet, Oral, Daily   omeprazole (PRILOSEC) 20 MG capsule TAKE 1 CAPSULE BY MOUTH EVERY DAY   ramipril (ALTACE) 5 MG capsule TAKE 1 CAPSULE BY MOUTH DAILY PLEASE CALL OFFICE TO SCHEDULE LABS NOW & BLOOD PRESSURE RECHECK AFTER   triamcinolone cream (KENALOG) 0.1 % 1 application, Topical, 2 times daily, To affected area.

## 2021-08-09 NOTE — Telephone Encounter (Signed)
Spoke with patient and he stated that he has started his palpitations have increased since yesterday. He is under a lot of stress at work which he believes might be contributing to them. He is willing to try a beta blocker as discussed at last OV if his symptoms were to persist or increase. He has a scheduled follow up on 08/17/21.

## 2021-08-09 NOTE — Telephone Encounter (Signed)
Patient c/o Palpitations:  High priority if patient c/o lightheadedness, shortness of breath, or chest pain  How long have you had palpitations/irregular HR/ Afib? Are you having the symptoms now? Not now  Are you currently experiencing lightheadedness, SOB or CP? no  Do you have a history of afib (atrial fibrillation) or irregular heart rhythm? yes  Have you checked your BP or HR? (document readings if available): this morning 120/80 HR 65-70  Are you experiencing any other symptoms? Patient states he is under a lot of stress at home and work

## 2021-08-10 MED ORDER — METOPROLOL SUCCINATE ER 25 MG PO TB24: 25.0000 mg | ORAL_TABLET | Freq: Every day | ORAL | 3 refills | Status: DC

## 2021-08-10 NOTE — Telephone Encounter (Signed)
Sent patient a MyChart message regarding starting metoprolol XL 25 MG QD per Dr. Garen Lah. Sent prescription into patients pharmacy.

## 2021-08-15 ENCOUNTER — Other Ambulatory Visit
Admission: RE | Admit: 2021-08-15 | Discharge: 2021-08-15 | Disposition: A | Discharge status: Home / Self Care | Payer: BC Managed Care – PPO | Source: Ambulatory Visit | Attending: Cardiology | Admitting: Cardiology

## 2021-08-15 DIAGNOSIS — E781 Pure hyperglyceridemia: Secondary | ICD-10-CM | POA: Insufficient documentation

## 2021-08-15 LAB — LDL CHOLESTEROL, DIRECT: Direct LDL: 65.7 mg/dL (ref 0–99)

## 2021-08-15 LAB — LIPID PANEL
Cholesterol: 155 mg/dL (ref 0–200)
HDL: 25 mg/dL — ABNORMAL LOW (ref >40)
Total CHOL/HDL Ratio: 6.2 RATIO
Triglycerides: 535 mg/dL — ABNORMAL HIGH (ref <150)

## 2021-08-17 ENCOUNTER — Ambulatory Visit: Payer: BC Managed Care – PPO | Admitting: Cardiology

## 2021-08-17 ENCOUNTER — Encounter: Payer: Self-pay | Admitting: Cardiology

## 2021-08-17 ENCOUNTER — Other Ambulatory Visit: Payer: Self-pay | Admitting: Medical

## 2021-08-17 ENCOUNTER — Telehealth: Payer: Self-pay

## 2021-08-17 ENCOUNTER — Other Ambulatory Visit: Payer: Self-pay

## 2021-08-17 VITALS — BP 110/76 | HR 63 | Ht 70.0 in | Wt 225.0 lb

## 2021-08-17 DIAGNOSIS — E781 Pure hyperglyceridemia: Secondary | ICD-10-CM | POA: Diagnosis not present

## 2021-08-17 DIAGNOSIS — I10 Essential (primary) hypertension: Secondary | ICD-10-CM

## 2021-08-17 DIAGNOSIS — I493 Ventricular premature depolarization: Secondary | ICD-10-CM | POA: Diagnosis not present

## 2021-08-17 MED ORDER — ICOSAPENT ETHYL 1 G PO CAPS: 2.0000 g | ORAL_CAPSULE | Freq: Two times a day (BID) | ORAL | 2 refills | Status: DC

## 2021-08-17 MED ORDER — EZETIMIBE 10 MG PO TABS: 10.0000 mg | ORAL_TABLET | Freq: Every day | ORAL | 3 refills | Status: DC

## 2021-08-17 NOTE — Telephone Encounter (Signed)
Vascepa PA initiated in [email or web address] KEY: [id number]  RESPONSE: Your information has been submitted to [place]. [employer] [place] will review the request and notify you of the determination decision directly, typically within 72 hours of receiving all information.  You will also receive your request decision electronically. To check for an update later, open this request again from your dashboard.  If [employer] has not responded within the specified timeframe or if you have any questions about your PA submission, contact [place]  directly at [phone].

## 2021-08-17 NOTE — Progress Notes (Signed)
Cardiology Office Note:    Date:  08/17/2021   ID:  Houston, Cory [birth year], MRN [id number]  PCP:  Ratcliffe, [name], [place]  Cardiologist:  [name], MD  Advanced Practice Provider:  No care team member to display Electrophysiologist:  None       Referring MD: Marcy Salvo*   Chief Complaint  Patient presents with   Follow-up    History of Present Illness:    Cory Houston is a 65 y.o. male with a hx of hypertension, hypertriglyceridemia, diabetes presents for follow-up.  Previously seen due to palpitations and elevated triglycerides.  He was started on fenofibrate, repeat fasting lipid profile obtained.  Cardiac monitor did show frequent PVCs, Toprol-XL 25 mg daily was started.  Symptoms of palpitations have improved since patient started Toprol-XL 3 days ago.  He states not eating healthy, low-cholesterol diet.  He takes all his medications as prescribed.  Prior notes Cardiac monitor 02/2021 frequent PVCs, 2.3% PVC burden.   Past Medical History:  Diagnosis Date   Allergy    Arthritis    Diabetes mellitus without complication (HCC)    GERD (gastroesophageal reflux disease)    Hyperlipidemia    Hypertension    Sleep apnea     Past Surgical History:  Procedure Laterality Date   VASECTOMY      Current Medications: Current Meds  Medication Sig   amLODipine (NORVASC) 10 MG tablet TAKE 1 TABLET BY MOUTH EVERY DAY   Cholecalciferol (VITAMIN D-3) 5000 units TABS Take 1 tablet by mouth every other day.    ezetimibe (ZETIA) 10 MG tablet Take 1 tablet (10 mg total) by mouth daily.   fenofibrate (TRICOR) 145 MG tablet Take 1 tablet (145 mg total) by mouth daily. No Further Refills Until Seen In Clinic   icosapent Ethyl (VASCEPA) 1 g capsule Take 2 capsules (2 g total) by mouth 2 (two) times daily.   JARDIANCE 10 MG TABS tablet TAKE 1 TABLET BY MOUTH EVERY DAY BEFORE BREAKFAST   loratadine (CLARITIN) 10 MG  tablet TAKE 1 TABLET BY MOUTH EVERY DAY   metFORMIN (GLUCOPHAGE-XR) 500 MG 24 hr tablet Take 2 tablets (1,000 mg total) by mouth 2 (two) times daily.   metoprolol succinate (TOPROL XL) 25 MG 24 hr tablet Take 1 tablet (25 mg total) by mouth daily.   Multiple Vitamin (MULTIVITAMIN) tablet Take 1 tablet by mouth daily.   omeprazole (PRILOSEC) 20 MG capsule TAKE 1 CAPSULE BY MOUTH EVERY DAY   ramipril (ALTACE) 5 MG capsule TAKE 1 CAPSULE BY MOUTH DAILY PLEASE CALL OFFICE TO SCHEDULE LABS NOW & BLOOD PRESSURE RECHECK AFTER   triamcinolone cream (KENALOG) 0.1 % Apply 1 application topically 2 (two) times daily. To affected area.   [DISCONTINUED] Omega-3 Fatty Acids (FISH OIL) 1200 MG CAPS Take 2,400 mg by mouth daily after breakfast. 1200 mg in the evening after supper     Allergies:   Codeine   Social History   Socioeconomic History   Marital status: Married    Spouse name: Not on file   Number of children: Not on file   Years of education: Not on file   Highest education level: Not on file  Occupational History   Not on file  Tobacco Use   Smoking status: Never   Smokeless tobacco: Never  Vaping Use   Vaping Use: Never used  Substance and Sexual Activity   Alcohol use: Yes    Comment: rarely  1 per month   Drug use: No   Sexual activity: Yes  Other Topics Concern   Not on file  Social History Narrative   Not on file   Social Determinants of Health   Financial Resource Strain: Not on file  Food Insecurity: Not on file  Transportation Needs: Not on file  Physical Activity: Not on file  Stress: Not on file  Social Connections: Not on file     Family History: The patient's family history includes Cataracts in his sister; Congestive Heart Failure in his father and mother; Diabetes in his father and mother; Diverticulitis in his father; Heart attack in his maternal grandmother; Heart disease in his mother; Heart murmur in his son; Hypercholesterolemia in his father and mother;  Hypertension in his brother, father, mother, paternal aunt, and paternal uncle; Lung cancer in his maternal grandmother; Obesity in his mother; Stroke in his paternal grandfather and paternal grandmother.  ROS:   Please see the history of present illness.     All other systems reviewed and are negative.  EKGs/Labs/Other Studies Reviewed:    The following studies were reviewed today:   EKG:  EKG is  ordered today.  The ekg ordered today demonstrates normal sinus rhythm, normal ECG.  Recent Labs: 10/09/2020: TSH 1.990 01/30/2021: ALT 54; BUN 19; Creatinine, Ser 1.01; Hemoglobin 17.4; Platelets 211; Potassium 4.4; Sodium 137  Recent Lipid Panel    Component Value Date/Time   CHOL 155 08/15/2021 0823   CHOL 189 01/30/2021 0805   TRIG 535 (H) 08/15/2021 0823   HDL 25 (L) 08/15/2021 0823   HDL 25 (L) 01/30/2021 0805   CHOLHDL 6.2 08/15/2021 0823   VLDL NOT CALCULATED 08/15/2021 0823   LDLCALC NOT CALCULATED 08/15/2021 0823   LDLCALC 104 (H) 01/30/2021 0805   LDLDIRECT 65.7 08/15/2021 0823     Risk Assessment/Calculations:      Physical Exam:    VS:  BP 110/76 (BP Location: Left Arm, Patient Position: Sitting, Cuff Size: Large)   Pulse 63   Ht 5\' 10"  (1.778 m)   Wt 225 lb (102.1 kg)   SpO2 98%   BMI 32.28 kg/m     Wt Readings from Last 3 Encounters:  08/17/21 225 lb (102.1 kg)  07/30/21 228 lb (103.4 kg)  07/23/21 228 lb (103.4 kg)     GEN:  Well nourished, well developed in no acute distress HEENT: Normal NECK: No JVD; No carotid bruits LYMPHATICS: No lymphadenopathy CARDIAC: RRR, no murmurs, rubs, gallops RESPIRATORY:  Clear to auscultation without rales, wheezing or rhonchi  ABDOMEN: Soft, non-tender, non-distended MUSCULOSKELETAL:  No edema; No deformity  SKIN: Warm and dry NEUROLOGIC:  Alert and oriented x 3 PSYCHIATRIC:  Normal affect   ASSESSMENT:    1. PVC's (premature ventricular contractions)   2. Pure hypertriglyceridemia   3. Primary  hypertension     PLAN:    In order of problems listed above:  Palpitations, cardiac monitor with frequent PVCs, .  PVC burden 2.3%.  Symptoms improved with Toprol-XL 25 mg daily.  Continue Toprol-XL 25 mg daily. History of hypertriglyceridemia.  Lipid panel with elevated triglycerides.  Start Zetia 10 mg daily, start Vascepa, stop OTC fish oil.  Continue fenofibrate.  Repeat lipid panel in 3 months. Hypertension, BP controlled.  Continue Norvasc and ramipril.  Follow-up in 3 months after repeat fasting lipid profile.     Medication Adjustments/Labs and Tests Ordered: Current medicines are reviewed at length with the patient today.  Concerns regarding medicines are  outlined above.  Orders Placed This Encounter  Procedures   Lipid panel   EKG 12-Lead    Meds ordered this encounter  Medications   ezetimibe (ZETIA) 10 MG tablet    Sig: Take 1 tablet (10 mg total) by mouth daily.    Dispense:  30 tablet    Refill:  3   icosapent Ethyl (VASCEPA) 1 g capsule    Sig: Take 2 capsules (2 g total) by mouth 2 (two) times daily.    Dispense:  120 capsule    Refill:  2     Patient Instructions  Medication Instructions:   Your physician has recommended you make the following change in your medication:    STOP taking Fish Oil Omega 3.  2.    START taking Vascepa 2G twice a day.  3.    START Zetia 10 MG once a day.   *If you need a refill on your cardiac medications before your next appointment, please call your pharmacy*   Lab Work:  Your physician recommends that you return for a FASTING lipid profile: The week prior to your 3 month follow up.  - You will need to be fasting. Please do not have anything to eat or drink after midnight the morning you have the lab work. You may only have water or black coffee with no cream or sugar.   We will call you closer to your appointment time and schedule you in our office for this lab draw.    Testing/Procedures: None  ordered   Follow-Up: At [facility], you and your health needs are our priority.  As part of our continuing mission to provide you with exceptional heart care, we have created designated Provider Care Teams.  These Care Teams include your primary Cardiologist (physician) and Advanced Practice Providers (APPs -  Physician Assistants and Nurse Practitioners) who all work together to provide you with the care you need, when you need it.  We recommend signing up for the patient portal called "MyChart".  Sign up information is provided on this After Visit Summary.  MyChart is used to connect with patients for Virtual Visits (Telemedicine).  Patients are able to view lab/test results, encounter notes, upcoming appointments, etc.  Non-urgent messages can be sent to your provider as well.   To learn more about what you can do with MyChart, go to [email or web address].    Your next appointment:   3 month(s)  The format for your next appointment:   In Person  Provider:   [name], MD   Other Instructions    Signed, [name], MD  08/17/2021 1:20 PM    [place]

## 2021-08-17 NOTE — Patient Instructions (Signed)
Medication Instructions:   Your physician has recommended you make the following change in your medication:    STOP taking Fish Oil Omega 3.  2.    START taking Vascepa 2G twice a day.  3.    START Zetia 10 MG once a day.   *If you need a refill on your cardiac medications before your next appointment, please call your pharmacy*   Lab Work:  Your physician recommends that you return for a FASTING lipid profile: The week prior to your 3 month follow up.  - You will need to be fasting. Please do not have anything to eat or drink after midnight the morning you have the lab work. You may only have water or black coffee with no cream or sugar.   We will call you closer to your appointment time and schedule you in our office for this lab draw.    Testing/Procedures: None ordered   Follow-Up: At [facility], you and your health needs are our priority.  As part of our continuing mission to provide you with exceptional heart care, we have created designated Provider Care Teams.  These Care Teams include your primary Cardiologist (physician) and Advanced Practice Providers (APPs -  Physician Assistants and Nurse Practitioners) who all work together to provide you with the care you need, when you need it.  We recommend signing up for the patient portal called "MyChart".  Sign up information is provided on this After Visit Summary.  MyChart is used to connect with patients for Virtual Visits (Telemedicine).  Patients are able to view lab/test results, encounter notes, upcoming appointments, etc.  Non-urgent messages can be sent to your provider as well.   To learn more about what you can do with MyChart, go to [email or web address].    Your next appointment:   3 month(s)  The format for your next appointment:   In Person  Provider:   [name], MD   Other Instructions

## 2021-09-19 ENCOUNTER — Other Ambulatory Visit: Payer: Self-pay | Admitting: Medical

## 2021-09-19 DIAGNOSIS — I10 Essential (primary) hypertension: Secondary | ICD-10-CM

## 2021-09-25 ENCOUNTER — Telehealth: Payer: Self-pay | Admitting: Cardiology

## 2021-09-25 NOTE — Telephone Encounter (Signed)
L MOM to schedule December lab appointment

## 2021-09-25 NOTE — Telephone Encounter (Signed)
Patient will have done with pcp labs   Closing encounter

## 2021-10-01 ENCOUNTER — Other Ambulatory Visit: Payer: Self-pay | Admitting: Medical

## 2021-10-01 ENCOUNTER — Encounter: Payer: Self-pay | Admitting: Medical

## 2021-10-01 DIAGNOSIS — Z0000 Encounter for general adult medical examination without abnormal findings: Secondary | ICD-10-CM

## 2021-10-01 DIAGNOSIS — E781 Pure hyperglyceridemia: Secondary | ICD-10-CM

## 2021-10-01 NOTE — Progress Notes (Signed)
Labs ordered for annual exam. Cardiology needs Lipid panel it is is included in our executive panel- male. Will notify cardiologist when resulted.

## 2021-10-01 NOTE — Progress Notes (Signed)
Needs clinic appoiintment with his cardiologist before refill of Tricor per note on bottle.

## 2021-10-31 ENCOUNTER — Other Ambulatory Visit: Payer: Self-pay

## 2021-11-01 ENCOUNTER — Other Ambulatory Visit: Payer: Self-pay

## 2021-11-01 ENCOUNTER — Other Ambulatory Visit: Payer: Self-pay | Admitting: Nurse Practitioner

## 2021-11-01 DIAGNOSIS — Z0000 Encounter for general adult medical examination without abnormal findings: Secondary | ICD-10-CM

## 2021-11-01 DIAGNOSIS — E119 Type 2 diabetes mellitus without complications: Secondary | ICD-10-CM

## 2021-11-01 DIAGNOSIS — E559 Vitamin D deficiency, unspecified: Secondary | ICD-10-CM

## 2021-11-02 LAB — CMP12+LP+TP+TSH+6AC+PSA+CBC…
ALT: 71 IU/L — ABNORMAL HIGH (ref 0–44)
AST: 42 IU/L — ABNORMAL HIGH (ref 0–40)
Albumin/Globulin Ratio: 1.7 (ref 1.2–2.2)
Albumin: 5 g/dL — ABNORMAL HIGH (ref 3.8–4.8)
Alkaline Phosphatase: 144 IU/L — ABNORMAL HIGH (ref 44–121)
BUN/Creatinine Ratio: 19 (ref 10–24)
BUN: 21 mg/dL (ref 8–27)
Basophils Absolute: 0 10*3/uL (ref 0.0–0.2)
Basos: 0 %
Bilirubin Total: 0.4 mg/dL (ref 0.0–1.2)
Calcium: 9.3 mg/dL (ref 8.6–10.2)
Chloride: 100 mmol/L (ref 96–106)
Chol/HDL Ratio: 6.1 ratio — ABNORMAL HIGH (ref 0.0–5.0)
Cholesterol, Total: 141 mg/dL (ref 100–199)
Creatinine, Ser: 1.1 mg/dL (ref 0.76–1.27)
EOS (ABSOLUTE): 0.1 10*3/uL (ref 0.0–0.4)
Eos: 1 %
Estimated CHD Risk: 1.3 times avg. — ABNORMAL HIGH (ref 0.0–1.0)
Free Thyroxine Index: 2.2 (ref 1.2–4.9)
GGT: 31 IU/L (ref 0–65)
Globulin, Total: 2.9 g/dL (ref 1.5–4.5)
Glucose: 170 mg/dL — ABNORMAL HIGH (ref 70–99)
HDL: 23 mg/dL — ABNORMAL LOW (ref >39)
Hematocrit: 49.3 % (ref 37.5–51.0)
Hemoglobin: 17.5 g/dL (ref 13.0–17.7)
Immature Grans (Abs): 0 10*3/uL (ref 0.0–0.1)
Immature Granulocytes: 0 %
Iron: 83 ug/dL (ref 38–169)
LDH: 212 IU/L (ref 121–224)
LDL Chol Calc (NIH): 37 mg/dL (ref 0–99)
Lymphocytes Absolute: 1.2 10*3/uL (ref 0.7–3.1)
Lymphs: 20 %
MCH: 29.8 pg (ref 26.6–33.0)
MCHC: 35.5 g/dL (ref 31.5–35.7)
MCV: 84 fL (ref 79–97)
Monocytes Absolute: 0.5 10*3/uL (ref 0.1–0.9)
Monocytes: 8 %
Neutrophils Absolute: 4.2 10*3/uL (ref 1.4–7.0)
Neutrophils: 71 %
Phosphorus: 2.7 mg/dL — ABNORMAL LOW (ref 2.8–4.1)
Platelets: 252 10*3/uL (ref 150–450)
Potassium: 4.9 mmol/L (ref 3.5–5.2)
Prostate Specific Ag, Serum: 0.2 ng/mL (ref 0.0–4.0)
RBC: 5.87 x10E6/uL — ABNORMAL HIGH (ref 4.14–5.80)
RDW: 13.7 % (ref 11.6–15.4)
Sodium: 136 mmol/L (ref 134–144)
T3 Uptake Ratio: 30 % (ref 24–39)
T4, Total: 7.4 ug/dL (ref 4.5–12.0)
TSH: 1.83 u[IU]/mL (ref 0.450–4.500)
Total Protein: 7.9 g/dL (ref 6.0–8.5)
Triglycerides: 576 mg/dL (ref 0–149)
Uric Acid: 6.1 mg/dL (ref 3.8–8.4)
VLDL Cholesterol Cal: 81 mg/dL — ABNORMAL HIGH (ref 5–40)
WBC: 6 10*3/uL (ref 3.4–10.8)
eGFR: 75 mL/min/{1.73_m2} (ref >59)

## 2021-11-02 LAB — HGB A1C W/O EAG: Hgb A1c MFr Bld: 7.3 % — ABNORMAL HIGH (ref 4.8–5.6)

## 2021-11-02 LAB — VITAMIN D 25 HYDROXY (VIT D DEFICIENCY, FRACTURES): Vit D, 25-Hydroxy: 26.8 ng/mL — ABNORMAL LOW (ref 30.0–100.0)

## 2021-11-13 ENCOUNTER — Other Ambulatory Visit: Payer: Self-pay

## 2021-11-13 MED ORDER — FENOFIBRATE 145 MG PO TABS
145.0000 mg | ORAL_TABLET | Freq: Every day | ORAL | 0 refills | Status: DC
Start: 2021-11-13 — End: 2021-12-10

## 2021-11-14 ENCOUNTER — Other Ambulatory Visit: Payer: Self-pay

## 2021-11-14 MED ORDER — EZETIMIBE 10 MG PO TABS: 10.0000 mg | ORAL_TABLET | Freq: Every day | ORAL | 3 refills | Status: DC

## 2021-11-14 MED ORDER — METOPROLOL SUCCINATE ER 25 MG PO TB24: 25.0000 mg | ORAL_TABLET | Freq: Every day | ORAL | 3 refills | Status: DC

## 2021-11-26 ENCOUNTER — Ambulatory Visit: Payer: BC Managed Care – PPO | Admitting: Cardiology

## 2021-11-26 ENCOUNTER — Encounter: Payer: Self-pay | Admitting: Cardiology

## 2021-11-26 ENCOUNTER — Other Ambulatory Visit: Payer: Self-pay

## 2021-11-26 VITALS — BP 120/66 | HR 71 | Ht 71.0 in | Wt 229.0 lb

## 2021-11-26 DIAGNOSIS — I493 Ventricular premature depolarization: Secondary | ICD-10-CM | POA: Diagnosis not present

## 2021-11-26 DIAGNOSIS — E781 Pure hyperglyceridemia: Secondary | ICD-10-CM | POA: Diagnosis not present

## 2021-11-26 DIAGNOSIS — I10 Essential (primary) hypertension: Secondary | ICD-10-CM | POA: Diagnosis not present

## 2021-11-26 NOTE — Progress Notes (Signed)
Cardiology Office Note:    Date:  11/26/2021   ID:  Cory, Houston 07-20-1956, MRN [id number]  PCP:  Ratcliffe, [name], [place]  Cardiologist:  [name], MD  Advanced Practice Provider:  No care team member to display Electrophysiologist:  None       Referring MD: Marcy Salvo*   Chief Complaint  Patient presents with   Other    3 month follow up - Meds reviewed verbally with patient.     History of Present Illness:    Cory Houston is a 66 y.o. male with a hx of hypertension, hypertriglyceridemia, diabetes presents for follow-up.  Previously seen due to palpitations and elevated triglycerides.  Patient being seen for hypertriglyceridemia and medication titration.  His current medical regimen includes Zetia, Vascepa, fenofibrate.  Zetia was added after last visit.  He endorses not eating healthy low-cholesterol diet.  He is also trying to exercise more.  Takes all medications as prescribed.  Her repeat fasting lipid profile about 3 weeks ago.  Has no other concerns or complaints at this time.   Prior notes Cardiac monitor 02/2021 frequent PVCs, 2.3% PVC burden.   Past Medical History:  Diagnosis Date   Allergy    Arthritis    Diabetes mellitus without complication (HCC)    GERD (gastroesophageal reflux disease)    Hyperlipidemia    Hypertension    Sleep apnea     Past Surgical History:  Procedure Laterality Date   VASECTOMY      Current Medications: Current Meds  Medication Sig   amLODipine (NORVASC) 10 MG tablet TAKE 1 TABLET BY MOUTH EVERY DAY   Cholecalciferol (VITAMIN D-3) 5000 units TABS Take 1 tablet by mouth every other day.    ezetimibe (ZETIA) 10 MG tablet Take 1 tablet (10 mg total) by mouth daily.   fenofibrate (TRICOR) 145 MG tablet Take 1 tablet (145 mg total) by mouth daily. No Further Refills Until Seen In Clinic   icosapent Ethyl (VASCEPA) 1 g capsule Take 2 capsules (2 g total) by mouth  2 (two) times daily.   JARDIANCE 10 MG TABS tablet TAKE 1 TABLET BY MOUTH EVERY DAY BEFORE BREAKFAST   loratadine (CLARITIN) 10 MG tablet TAKE 1 TABLET BY MOUTH EVERY DAY   metFORMIN (GLUCOPHAGE-XR) 500 MG 24 hr tablet Take 2 tablets (1,000 mg total) by mouth 2 (two) times daily.   metoprolol succinate (TOPROL XL) 25 MG 24 hr tablet Take 1 tablet (25 mg total) by mouth daily.   Multiple Vitamin (MULTIVITAMIN) tablet Take 1 tablet by mouth daily.   omeprazole (PRILOSEC) 20 MG capsule TAKE 1 CAPSULE BY MOUTH EVERY DAY   ramipril (ALTACE) 5 MG capsule TAKE 1 CAPSULE BY MOUTH DAILY PLEASE CALL OFFICE TO SCHEDULE LABS NOW & BLOOD PRESSURE RECHECK AFTER   triamcinolone cream (KENALOG) 0.1 % Apply 1 application topically 2 (two) times daily. To affected area.     Allergies:   Codeine   Social History   Socioeconomic History   Marital status: Married    Spouse name: Not on file   Number of children: Not on file   Years of education: Not on file   Highest education level: Not on file  Occupational History   Not on file  Tobacco Use   Smoking status: Never   Smokeless tobacco: Never  Vaping Use   Vaping Use: Never used  Substance and Sexual Activity   Alcohol use: Yes  Comment: rarely 1 per month   Drug use: No   Sexual activity: Yes  Other Topics Concern   Not on file  Social History Narrative   Not on file   Social Determinants of Health   Financial Resource Strain: Not on file  Food Insecurity: Not on file  Transportation Needs: Not on file  Physical Activity: Not on file  Stress: Not on file  Social Connections: Not on file     Family History: The patient's family history includes Cataracts in his sister; Congestive Heart Failure in his father and mother; Diabetes in his father and mother; Diverticulitis in his father; Heart attack in his maternal grandmother; Heart disease in his mother; Heart murmur in his son; Hypercholesterolemia in his father and mother; Hypertension  in his brother, father, mother, paternal aunt, and paternal uncle; Lung cancer in his maternal grandmother; Obesity in his mother; Stroke in his paternal grandfather and paternal grandmother.  ROS:   Please see the history of present illness.     All other systems reviewed and are negative.  EKGs/Labs/Other Studies Reviewed:    The following studies were reviewed today:   EKG:  EKG is  ordered today.  The ekg ordered today demonstrates normal sinus rhythm, occasional PVCs  Recent Labs: 11/01/2021: ALT 71; BUN 21; Creatinine, Ser 1.10; Hemoglobin 17.5; Platelets 252; Potassium 4.9; Sodium 136; TSH 1.830  Recent Lipid Panel    Component Value Date/Time   CHOL 141 11/01/2021 0814   TRIG 576 (HH) 11/01/2021 0814   HDL 23 (L) 11/01/2021 0814   CHOLHDL 6.1 (H) 11/01/2021 0814   CHOLHDL 6.2 08/15/2021 0823   VLDL NOT CALCULATED 08/15/2021 0823   LDLCALC 37 11/01/2021 0814   LDLDIRECT 65.7 08/15/2021 0823     Risk Assessment/Calculations:      Physical Exam:    VS:  BP 120/66 (BP Location: Left Arm, Patient Position: Sitting, Cuff Size: Normal)    Pulse 71    Ht 5\' 11"  (1.803 m)    Wt 229 lb (103.9 kg)    SpO2 98%    BMI 31.94 kg/m     Wt Readings from Last 3 Encounters:  11/26/21 229 lb (103.9 kg)  08/17/21 225 lb (102.1 kg)  07/30/21 228 lb (103.4 kg)     GEN:  Well nourished, well developed in no acute distress HEENT: Normal NECK: No JVD; No carotid bruits LYMPHATICS: No lymphadenopathy CARDIAC: RRR, no murmurs, rubs, gallops RESPIRATORY:  Clear to auscultation without rales, wheezing or rhonchi  ABDOMEN: Soft, non-tender, non-distended MUSCULOSKELETAL:  No edema; No deformity  SKIN: Warm and dry NEUROLOGIC:  Alert and oriented x 3 PSYCHIATRIC:  Normal affect   ASSESSMENT:    1. PVC's (premature ventricular contractions)   2. Pure hypertriglyceridemia   3. Primary hypertension      PLAN:    In order of problems listed above:  Frequent PVCs, PVC burden  2.3%.  Symptoms improved after starting Toprol-XL, continue Toprol-XL 25 mg daily. hypertriglyceridemia.  Not adhering to low-cholesterol diet.  Low-cholesterol diet was emphasized.  Continue Zetia 10 mg daily, Vascepa,  fenofibrate.  Exercise advised.  Repeat lipid panel in 3 months.  Consider adding statin if cholesterol still elevated. Hypertension, BP controlled.  Continue Norvasc and ramipril.  Follow-up in 3 months     Medication Adjustments/Labs and Tests Ordered: Current medicines are reviewed at length with the patient today.  Concerns regarding medicines are outlined above.  Orders Placed This Encounter  Procedures   Lipid panel  EKG 12-Lead    No orders of the defined types were placed in this encounter.    Patient Instructions  Medication Instructions:  Your physician recommends that you continue on your current medications as directed. Please refer to the Current Medication list given to you today.  *If you need a refill on your cardiac medications before your next appointment, please call your pharmacy*   Lab Work:  Your physician recommends that you return for a FASTING lipid profile: The week prior to your 3 month follow up.  - You will need to be fasting. Please do not have anything to eat or drink after midnight the morning you have the lab work. You may only have water or black coffee with no cream or sugar.   We will call you closer to your appointment time and schedule you in our office for this lab draw.     Testing/Procedures: None ordered   Follow-Up: At [facility], you and your health needs are our priority.  As part of our continuing mission to provide you with exceptional heart care, we have created designated Provider Care Teams.  These Care Teams include your primary Cardiologist (physician) and Advanced Practice Providers (APPs -  Physician Assistants and Nurse Practitioners) who all work together to provide you with the care you need,  when you need it.  We recommend signing up for the patient portal called "MyChart".  Sign up information is provided on this After Visit Summary.  MyChart is used to connect with patients for Virtual Visits (Telemedicine).  Patients are able to view lab/test results, encounter notes, upcoming appointments, etc.  Non-urgent messages can be sent to your provider as well.   To learn more about what you can do with MyChart, go to [email or web address].    Your next appointment:   3 month(s)  The format for your next appointment:   In Person  Provider:   You may see [name], MD or one of the following Advanced Practice Providers on your designated Care Team:   Murray Hodgkins, NP Christell Faith, PA-C Cadence Kathlen Mody, [place]    Other Instructions     Signed, [name], MD  11/26/2021 12:37 PM    [place]

## 2021-11-26 NOTE — Patient Instructions (Signed)
Medication Instructions:  Your physician recommends that you continue on your current medications as directed. Please refer to the Current Medication list given to you today.  *If you need a refill on your cardiac medications before your next appointment, please call your pharmacy*   Lab Work:  Your physician recommends that you return for a FASTING lipid profile: The week prior to your 3 month follow up.  - You will need to be fasting. Please do not have anything to eat or drink after midnight the morning you have the lab work. You may only have water or black coffee with no cream or sugar.   We will call you closer to your appointment time and schedule you in our office for this lab draw.     Testing/Procedures: None ordered   Follow-Up: At [facility], you and your health needs are our priority.  As part of our continuing mission to provide you with exceptional heart care, we have created designated Provider Care Teams.  These Care Teams include your primary Cardiologist (physician) and Advanced Practice Providers (APPs -  Physician Assistants and Nurse Practitioners) who all work together to provide you with the care you need, when you need it.  We recommend signing up for the patient portal called "MyChart".  Sign up information is provided on this After Visit Summary.  MyChart is used to connect with patients for Virtual Visits (Telemedicine).  Patients are able to view lab/test results, encounter notes, upcoming appointments, etc.  Non-urgent messages can be sent to your provider as well.   To learn more about what you can do with MyChart, go to [email or web address].    Your next appointment:   3 month(s)  The format for your next appointment:   In Person  Provider:   You may see [name], MD or one of the following Advanced Practice Providers on your designated Care Team:   Murray Hodgkins, NP Christell Faith, PA-C Cadence Kathlen Mody, [place]    Other  Instructions

## 2021-12-04 ENCOUNTER — Encounter: Payer: Self-pay | Admitting: Medical

## 2021-12-05 ENCOUNTER — Other Ambulatory Visit: Payer: Self-pay

## 2021-12-05 ENCOUNTER — Telehealth: Payer: Self-pay | Admitting: Medical

## 2021-12-05 MED ORDER — ICOSAPENT ETHYL 1 G PO CAPS: 2.0000 g | ORAL_CAPSULE | Freq: Two times a day (BID) | ORAL | 2 refills | Status: DC

## 2021-12-05 NOTE — Telephone Encounter (Signed)
Called yesterday and last week , left messages both times.  To review labs.   No call back or MyChart message from patient.

## 2021-12-10 ENCOUNTER — Other Ambulatory Visit: Payer: Self-pay | Admitting: *Deleted

## 2021-12-10 ENCOUNTER — Ambulatory Visit: Payer: Self-pay | Admitting: Medical

## 2021-12-10 ENCOUNTER — Other Ambulatory Visit: Payer: Self-pay

## 2021-12-10 DIAGNOSIS — R7401 Elevation of levels of liver transaminase levels: Secondary | ICD-10-CM

## 2021-12-10 DIAGNOSIS — R7301 Impaired fasting glucose: Secondary | ICD-10-CM

## 2021-12-10 DIAGNOSIS — E559 Vitamin D deficiency, unspecified: Secondary | ICD-10-CM

## 2021-12-10 DIAGNOSIS — R7309 Other abnormal glucose: Secondary | ICD-10-CM

## 2021-12-10 MED ORDER — FENOFIBRATE 145 MG PO TABS: 145.0000 mg | ORAL_TABLET | Freq: Every day | ORAL | 4 refills | Status: DC

## 2021-12-10 NOTE — Progress Notes (Signed)
Cardiologist Dr. Garen Lah is managing Lawton's triglycerides He is currently taking   Zetia 10 mg daily Tricor 145 mg daily Vascepa 1 gram  2 capsules 2 times daily  AST 42 ALT 71  Mild elevation. Will recheck in 2  months. Will share note with his doctor.  576 triglycerides.  Kirtan admits that he likes cold cut meats. I recommended he can have it one time per week. He also is finding it challenging to figure out what to eat for breakfast instead of eggs and fruit. I will discuss with dietitian Maggie May.  He also has restarted his Vit D3. Currently at  26.8.  A1C 7.3. increased from last A1C 7.0 Glucose 170 increased from 162.  Cardiologist wanted a recheck of lipid panel on 11/26/2021.  He fell last week onto his chest and phone was in pocket and now when he moves certain ways he says it is painful, he will come into the clinic this afternoon for evaluation.

## 2021-12-10 NOTE — Progress Notes (Deleted)
Patient ID: Cory Houston, male   DOB: 05/11/1956, 66 y.o.   MRN: [id number]  [facility] [address]  [place], [place] [address] Phone [phone] Fax  [phone]  Worker's Compensation Report Form   Rachael Seaborn Date of [place] Phone Number:*** Email:*** Department:*** Job Title:*** Supervisor:*** Supervisor Notified:***  Date of Injury:*** Time of Injury:*** Shift Worked:*** Location where injury occurred (address or landmark):***  Body Part Injured:***  Vital Signs There were no vitals taken for this visit.  Injury Description  ***   Provider Note ***   Diagnosis  ***  Medications Prescribed  No orders of the defined types were placed in this encounter.   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

## 2021-12-11 ENCOUNTER — Ambulatory Visit
Admission: RE | Admit: 2021-12-11 | Discharge: 2021-12-11 | Disposition: A | Discharge status: Home / Self Care | Payer: No Typology Code available for payment source | Source: Ambulatory Visit | Attending: Medical | Admitting: Medical

## 2021-12-11 ENCOUNTER — Ambulatory Visit
Admission: RE | Admit: 2021-12-11 | Discharge: 2021-12-11 | Disposition: A | Discharge status: Home / Self Care | Payer: No Typology Code available for payment source | Attending: Medical | Admitting: Medical

## 2021-12-11 ENCOUNTER — Ambulatory Visit: Payer: Self-pay | Admitting: Medical

## 2021-12-11 ENCOUNTER — Encounter: Payer: Self-pay | Admitting: Medical

## 2021-12-11 VITALS — BP 110/72 | HR 93 | Temp 97.6°F | Resp 16 | Ht 70.0 in | Wt 226.8 lb

## 2021-12-11 DIAGNOSIS — R0781 Pleurodynia: Secondary | ICD-10-CM
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
## 2021-12-11 NOTE — Patient Instructions (Signed)
Ice to the area. Ibuprofen 600mg  every 6 hours as needed for pain.   Contusion A contusion is a deep bruise. This is a result of an injury that causes bleeding under the skin. Symptoms of bruising include pain, swelling, and discolored skin. The skin may turn blue, purple, or yellow. Follow these instructions at home: Managing pain, stiffness, and swelling You may use RICE. This stands for: Resting. Icing. Compression, or putting pressure. Elevating, or raising the injured area. To follow this method, do these actions: Rest the injured area. If told, put ice on the injured area. Put ice in a plastic bag. Place a towel between your skin and the bag. Leave the ice on for 20 minutes, 2-3 times per day. If told, put light pressure (compression) on the injured area using an elastic bandage. Make sure the bandage is not too tight. If the area tingles or becomes numb, remove it and put it back on as told by your doctor. If possible, raise (elevate) the injured area above the level of your heart while you are sitting or lying down.  General instructions Take over-the-counter and prescription medicines only as told by your doctor. Keep all follow-up visits as told by your doctor. This is important. Contact a doctor if: Your symptoms do not get better after several days of treatment. Your symptoms get worse. You have trouble moving the injured area. Get help right away if: You have very bad pain. You have a loss of feeling (numbness) in a hand or foot. Your hand or foot turns pale or cold. Summary A contusion is a deep bruise. This is a result of an injury that causes bleeding under the skin. Symptoms of bruising include pain, swelling, and discolored skin. The skin may turn blue, purple, or yellow. This condition is treated with rest, ice, compression, and elevation. This is also called RICE. You may be given over-the-counter medicines for pain. Contact a doctor if you do not feel better,  or you feel worse. Get help right away if you have very bad pain, have lost feeling in a hand or foot, or the area turns pale or cold. This information is not intended to replace advice given to you by your health care provider. Make sure you discuss any questions you have with your health care provider. Document Revised: 06/26/2018 Document Reviewed: 06/26/2018 Elsevier Patient Education  [place].

## 2021-12-17 ENCOUNTER — Encounter: Payer: Self-pay | Admitting: Medical

## 2021-12-18 ENCOUNTER — Ambulatory Visit: Payer: Self-pay | Admitting: Medical

## 2021-12-18 ENCOUNTER — Other Ambulatory Visit: Payer: Self-pay

## 2021-12-18 VITALS — BP 128/78 | HR 65 | Temp 97.4°F | Resp 16

## 2021-12-18 DIAGNOSIS — R0781 Pleurodynia: Secondary | ICD-10-CM
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
## 2021-12-28 ENCOUNTER — Other Ambulatory Visit: Payer: Self-pay | Admitting: Medical

## 2021-12-28 DIAGNOSIS — E119 Type 2 diabetes mellitus without complications: Secondary | ICD-10-CM

## 2022-01-01 ENCOUNTER — Other Ambulatory Visit: Payer: Self-pay

## 2022-01-01 ENCOUNTER — Encounter: Payer: Self-pay | Admitting: Medical

## 2022-01-01 ENCOUNTER — Ambulatory Visit: Payer: Self-pay | Admitting: Medical

## 2022-01-01 VITALS — BP 102/64 | HR 82 | Temp 97.7°F | Resp 16

## 2022-01-01 DIAGNOSIS — S2002XD Contusion of left breast, subsequent encounter: Secondary | ICD-10-CM
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
## 2022-01-03 ENCOUNTER — Other Ambulatory Visit: Payer: Self-pay | Admitting: Medical

## 2022-01-03 DIAGNOSIS — I10 Essential (primary) hypertension: Secondary | ICD-10-CM

## 2022-01-21 ENCOUNTER — Ambulatory Visit: Payer: Self-pay | Admitting: Medical

## 2022-01-21 ENCOUNTER — Encounter: Payer: Self-pay | Admitting: Medical

## 2022-01-21 ENCOUNTER — Other Ambulatory Visit: Payer: Self-pay

## 2022-01-21 VITALS — BP 110/70 | HR 74 | Temp 97.0°F | Resp 16 | Ht 70.0 in | Wt 223.0 lb

## 2022-01-21 DIAGNOSIS — E559 Vitamin D deficiency, unspecified: Secondary | ICD-10-CM

## 2022-01-21 DIAGNOSIS — R7401 Elevation of levels of liver transaminase levels: Secondary | ICD-10-CM

## 2022-01-21 DIAGNOSIS — Z1211 Encounter for screening for malignant neoplasm of colon: Secondary | ICD-10-CM

## 2022-01-21 DIAGNOSIS — Z23 Encounter for immunization: Secondary | ICD-10-CM

## 2022-01-21 NOTE — Progress Notes (Signed)
Subjective:    Patient ID: Cory Houston, male    DOB: Jan 03, 1956, 66 y.o.   MRN: [id number]  HPI 66 yo male in non acute distress, here for annual physical exam. 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 No complaints today.  Blood pressure 110/70, pulse 74, temperature (!) 97 F (36.1 C), temperature source Tympanic, resp. rate 16, height '5\' 10"'$  (1.778 m), weight 223 lb (101.2 kg), SpO2 97 %.  Allergies  Allergen Reactions   Codeine Anaphylaxis   Past Medical History:  Diagnosis Date   Allergy    Arthritis    Diabetes mellitus without complication (HCC)    GERD (gastroesophageal reflux disease)    Hyperlipidemia    Hypertension    Sleep apnea     Past Surgical History:  Procedure Laterality Date   VASECTOMY       . Family History  Problem Relation Age of Onset   Diabetes Mother    Obesity Mother    Hypertension Mother    Heart disease Mother    Congestive Heart Failure Mother    Hypercholesterolemia Mother    Diabetes Father    Congestive Heart Failure Father         cardiopulmonary edema.- Father 82 deceased from complications.    Diverticulitis Father    Hypertension Father    Hypercholesterolemia Father    Cataracts Sister    Hypertension Brother    Hypertension Paternal Aunt    Hypertension Paternal Uncle    Heart attack Maternal Grandmother    Lung cancer Maternal Grandmother    Stroke Paternal Grandmother    Stroke Paternal Grandfather    Heart murmur Son      Current Outpatient Medications:    amLODipine (NORVASC) 10 MG tablet, TAKE 1 TABLET BY MOUTH EVERY DAY, Disp: 90 tablet, Rfl: 1   Cholecalciferol (VITAMIN D-3) 5000 units TABS, Take 1 tablet by mouth every other day. , Disp: , Rfl:    ezetimibe (ZETIA) 10 MG tablet, Take 1 tablet (10 mg total) by mouth daily., Disp: 30 tablet, Rfl: 3   fenofibrate (TRICOR) 145 MG tablet, Take 1 tablet (145 mg total) by mouth daily., Disp: 30 tablet, Rfl: 4   icosapent Ethyl (VASCEPA) 1 g  capsule, Take 2 capsules (2 g total) by mouth 2 (two) times daily., Disp: 120 capsule, Rfl: 2   JARDIANCE 10 MG TABS tablet, TAKE 1 TABLET BY MOUTH EVERY DAY BEFORE BREAKFAST, Disp: 90 tablet, Rfl: 1   loratadine (CLARITIN) 10 MG tablet, TAKE 1 TABLET BY MOUTH EVERY DAY, Disp: 90 tablet, Rfl: 3   metFORMIN (GLUCOPHAGE-XR) 500 MG 24 hr tablet, TAKE 2 TABLETS BY MOUTH TWICE A DAY, Disp: 360 tablet, Rfl: 1   metoprolol succinate (TOPROL XL) 25 MG 24 hr tablet, Take 1 tablet (25 mg total) by mouth daily., Disp: 30 tablet, Rfl: 3   Multiple Vitamin (MULTIVITAMIN) tablet, Take 1 tablet by mouth daily., Disp: , Rfl:    omeprazole (PRILOSEC) 20 MG capsule, TAKE 1 CAPSULE BY MOUTH EVERY DAY, Disp: 90 capsule, Rfl: 1   ramipril (ALTACE) 5 MG capsule, TAKE 1 CAPSULE BY MOUTH DAILY PLEASE CALL OFFICE TO SCHEDULE LABS NOW & BLOOD PRESSURE RECHECK AFTER, Disp: 90 capsule, Rfl: 0   triamcinolone cream (KENALOG) 0.1 %, Apply 1 application topically 2 (two) times daily. To affected area., Disp: 30 g, Rfl: 0    Review of Systems  All other systems reviewed and are negative.     Objective:   Physical  Exam Vitals and nursing note reviewed. Exam conducted with a chaperone present.  Constitutional:      Appearance: Normal appearance.  HENT:     Head: Normocephalic and atraumatic.     Jaw: There is normal jaw occlusion.     Right Ear: Tympanic membrane, ear canal and external ear normal.     Left Ear: Tympanic membrane, ear canal and external ear normal.     Nose: Nose normal.     Mouth/Throat:     Mouth: Mucous membranes are moist.     Pharynx: Oropharynx is clear.  Eyes:     General: Lids are normal.     Extraocular Movements: Extraocular movements intact.     Conjunctiva/sclera: Conjunctivae normal.     Pupils: Pupils are equal, round, and reactive to light.  Neck:     Trachea: Trachea and phonation normal.  Cardiovascular:     Rate and Rhythm: Normal rate and regular rhythm.     Pulses: Normal  pulses.          Carotid pulses are 2+ on the right side and 2+ on the left side.      Radial pulses are 2+ on the right side and 2+ on the left side.       Popliteal pulses are 2+ on the right side and 2+ on the left side.       Dorsalis pedis pulses are 2+ on the right side and 2+ on the left side.       Posterior tibial pulses are 2+ on the right side and 2+ on the left side.     Heart sounds: Normal heart sounds.    No friction rub. No gallop. No S3 or S4 sounds.  Pulmonary:     Effort: Pulmonary effort is normal.     Breath sounds: Normal breath sounds and air entry.  Chest:     Chest wall: No mass, deformity, swelling, tenderness, crepitus or edema.  Breasts:    Right: Normal.     Left: Normal.  Abdominal:     General: Bowel sounds are normal.     Palpations: Abdomen is soft.     Hernia: A hernia is present. Hernia is present in the ventral area.  Genitourinary:    Penis: Normal and circumcised.      Testes: Normal.     Epididymis:     Right: Normal.     Left: Normal.     Prostate: Normal.     Rectum: Normal. Guaiac result negative.  Musculoskeletal:     Cervical back: Normal range of motion and neck supple.     Right lower leg: No edema.     Left lower leg: No edema.  Feet:     Right foot:     Skin integrity: Skin integrity normal.     Toenail Condition: Right toenails are normal.     Left foot:     Skin integrity: Skin integrity normal.     Toenail Condition: Left toenails are normal.  Lymphadenopathy:     Head:     Right side of head: No submental, submandibular, tonsillar, preauricular, posterior auricular or occipital adenopathy.     Left side of head: No submental, submandibular, tonsillar, preauricular, posterior auricular or occipital adenopathy.     Cervical:     Right cervical: No superficial, deep or posterior cervical adenopathy.    Left cervical: No superficial, deep or posterior cervical adenopathy.     Upper Body:  Right upper body: No  supraclavicular, axillary, pectoral or epitrochlear adenopathy.     Left upper body: No supraclavicular, axillary, pectoral or epitrochlear adenopathy.  Skin:    General: Skin is warm and dry.     Capillary Refill: Capillary refill takes less than 2 seconds.  Neurological:     General: No focal deficit present.     Mental Status: He is alert and oriented to person, place, and time. Mental status is at baseline.     GCS: GCS eye subscore is 4. GCS verbal subscore is 5. GCS motor subscore is 6.     Cranial Nerves: Cranial nerves 2-12 are intact.     Sensory: Sensation is intact.     Motor: Motor function is intact.     Coordination: Coordination is intact.     Gait: Gait is intact.  Psychiatric:        Attention and Perception: Attention and perception normal.        Mood and Affect: Mood normal.        Speech: Speech normal.        Behavior: Behavior normal. Behavior is cooperative.        Thought Content: Thought content normal.        Cognition and Memory: Cognition and memory normal.        Judgment: Judgment normal.          Assessment & Plan:  Annual physical exam Tdap updated Hyperdyslipidemia counseled patient on diet. Hyperglycemia Vit D deficiency take vit D3 with Calcium Increase in A1C Return in  3 month for recheck of labs. Patient verbalizes understanding and has no questions at discharge. Elevated ALT will monitor Patient verbalizes understanding and has no questions at discharge.

## 2022-01-21 NOTE — Patient Instructions (Signed)
Tdap (Tetanus, Diphtheria, Pertussis) Vaccine: What You Need to Know 1. Why get vaccinated? Tdap vaccine can prevent tetanus, diphtheria, and pertussis. Diphtheria and pertussis spread from person to person. Tetanus enters the body through cuts or wounds. TETANUS (T) causes painful stiffening of the muscles. Tetanus can lead to serious health problems, including being unable to open the mouth, having trouble swallowing and breathing, or death. DIPHTHERIA (D) can lead to difficulty breathing, heart failure, paralysis, or death. PERTUSSIS (aP), also known as "whooping cough," can cause uncontrollable, violent coughing that makes it hard to breathe, eat, or drink. Pertussis can be extremely serious especially in babies and young children, causing pneumonia, convulsions, brain damage, or death. In teens and adults, it can cause weight loss, loss of bladder control, passing out, and rib fractures from severe coughing. 2. Tdap vaccine Tdap is only for children 7 years and older, adolescents, and adults.  Adolescents should receive a single dose of Tdap, preferably at age 66 or 34 years. Pregnant people should get a dose of Tdap during every pregnancy, preferably during the early part of the third trimester, to help protect the newborn from pertussis. Infants are most at risk for severe, life-threatening complications from pertussis. Adults who have never received Tdap should get a dose of Tdap. Also, adults should receive a booster dose of either Tdap or Td (a different vaccine that protects against tetanus and diphtheria but not pertussis) every 10 years, or after 5 years in the case of a severe or dirty wound or burn. Tdap may be given at the same time as other vaccines. 3. Talk with your health care provider Tell your vaccine provider if the person getting the vaccine: Has had an allergic reaction after a previous dose of any vaccine that protects against tetanus, diphtheria, or pertussis, or has any  severe, life-threatening allergies Has had a coma, decreased level of consciousness, or prolonged seizures within 7 days after a previous dose of any pertussis vaccine (DTP, DTaP, or Tdap) Has seizures or another nervous system problem Has ever had Guillain-Barr Syndrome (also called "GBS") Has had severe pain or swelling after a previous dose of any vaccine that protects against tetanus or diphtheria In some cases, your health care provider may decide to postpone Tdap vaccination until a future visit. People with minor illnesses, such as a cold, may be vaccinated. People who are moderately or severely ill should usually wait until they recover before getting Tdap vaccine.  Your health care provider can give you more information. 4. Risks of a vaccine reaction Pain, redness, or swelling where the shot was given, mild fever, headache, feeling tired, and nausea, vomiting, diarrhea, or stomachache sometimes happen after Tdap vaccination. People sometimes faint after medical procedures, including vaccination. Tell your provider if you feel dizzy or have vision changes or ringing in the ears.  As with any medicine, there is a very remote chance of a vaccine causing a severe allergic reaction, other serious injury, or death. 5. What if there is a serious problem? An allergic reaction could occur after the vaccinated person leaves the clinic. If you see signs of a severe allergic reaction (hives, swelling of the face and throat, difficulty breathing, a fast heartbeat, dizziness, or weakness), call 9-1-1 and get the person to the nearest hospital. For other signs that concern you, call your health care provider.  Adverse reactions should be reported to the Vaccine Adverse Event Reporting System (VAERS). Your health care provider will usually file this report, or you  can do it yourself. Visit the VAERS website at www.vaers.SamedayNews.es or call 419-060-8163. VAERS is only for reporting reactions, and VAERS staff  members do not give medical advice. 6. The National Vaccine Injury Compensation Program The [employer] Vaccine Injury Compensation Program (VICP) is a federal program that was created to compensate people who may have been injured by certain vaccines. Claims regarding alleged injury or death due to vaccination have a time limit for filing, which may be as short as two years. Visit the VICP website at GoldCloset.com.ee or call 315-487-0377 to learn about the program and about filing a claim. 7. How can I learn more? Ask your health care provider. Call your local or state health department. Visit the website of the Food and Drug Administration (FDA) for vaccine package inserts and additional information at TraderRating.uy. Contact the Centers for Disease Control and Prevention (CDC): Call 4848449234 (1-800-CDC-INFO) or Visit CDC's website at http://hunter.com/. Vaccine Information Statement Tdap (Tetanus, Diphtheria, Pertussis) Vaccine (06/23/2020) This information is not intended to replace advice given to you by your health care provider. Make sure you discuss any questions you have with your health care provider. Document Revised: 07/19/2020 Document Reviewed: 07/19/2020 Elsevier Patient Education  2022 [place]. Vitamin D Deficiency Vitamin D deficiency is when your body does not have enough vitamin D. Vitamin D is important to your body for many reasons: It helps the body absorb two important minerals--calcium and phosphorus. It plays a role in bone health. It may help to prevent some diseases, such as diabetes and multiple sclerosis. It plays a role in muscle function, including heart function. If vitamin D deficiency is severe, it can cause a condition in which your bones become soft. In adults, this condition is called osteomalacia. In children, this condition is called rickets. What are the causes? This condition may be caused  by: Not eating enough foods that contain vitamin D. Not getting enough natural sun exposure. Having certain digestive system diseases that make it difficult for your body to absorb vitamin D. These diseases include Crohn's disease, chronic pancreatitis, and cystic fibrosis. Having a surgery in which a part of the stomach or a part of the small intestine is removed. Having chronic kidney disease or liver disease. What increases the risk? You are more likely to develop this condition if you: Are older. Do not spend much time outdoors. Live in a long-term care facility. Have had broken bones. Have weak or thin bones (osteoporosis). Have a disease or condition that changes how the body absorbs vitamin D. Have dark skin. Take certain medicines, such as steroid medicines or certain seizure medicines. Are overweight or obese. What are the signs or symptoms? In mild cases of vitamin D deficiency, there may not be any symptoms. If the condition is severe, symptoms may include: Bone pain. Muscle pain. Falling often. Broken bones caused by a minor injury. How is this diagnosed? This condition may be diagnosed with blood tests. Imaging tests such as X-rays may also be done to look for changes in the bone. How is this treated? Treatment for this condition may depend on what caused the condition. Treatment options include: Taking vitamin D supplements. Your health care provider will suggest what dose is best for you. Taking a calcium supplement. Your health care provider will suggest what dose is best for you. Follow these instructions at home: Eating and drinking  Eat foods that contain vitamin D. Choices include: Fortified dairy products, cereals, or juices. Fortified means that vitamin D has been  added to the food. Check the label on the package to see if the food is fortified. Fatty fish, such as salmon or trout. Eggs. Oysters. Mushrooms. The items listed above may not be a complete list  of recommended foods and beverages. Contact a dietitian for more information. General instructions Take medicines and supplements only as told by your health care provider. Get regular, safe exposure to natural sunlight. Do not use a tanning bed. Maintain a healthy weight. Lose weight if needed. Keep all follow-up visits as told by your health care provider. This is important. How is this prevented? You can get vitamin D by: Eating foods that naturally contain vitamin D. Eating or drinking products that have been fortified with vitamin D, such as cereals, juices, and dairy products (including milk). Taking a vitamin D supplement or a multivitamin supplement that contains vitamin D. Being in the sun. Your body naturally makes vitamin D when your skin is exposed to sunlight. Your body changes the sunlight into a form of the vitamin that it can use. Contact a health care provider if: Your symptoms do not go away. You feel nauseous or you vomit. You have fewer bowel movements than usual or are constipated. Summary Vitamin D deficiency is when your body does not have enough vitamin D. Vitamin D is important to your body for good bone health and muscle function, and it may help prevent some diseases. Vitamin D deficiency is primarily treated through supplementation. Your health care provider will suggest what dose is best for you. You can get vitamin D by eating foods that contain vitamin D, by being in the sun, and by taking a vitamin D supplement or a multivitamin supplement that contains vitamin D. This information is not intended to replace advice given to you by your health care provider. Make sure you discuss any questions you have with your health care provider. Document Revised: 07/13/2018 Document Reviewed: 07/13/2018 Elsevier Patient Education  2022 [place]. Dyslipidemia Dyslipidemia is an imbalance of waxy, fat-like substances (lipids) in the blood. The body needs lipids in small  amounts. Dyslipidemia often involves a high level of cholesterol or triglycerides, which are types of lipids. Common forms of dyslipidemia include: High levels of LDL cholesterol. LDL is the type of cholesterol that causes fatty deposits (plaques) to build up in the blood vessels that carry blood away from the heart (arteries). Low levels of HDL cholesterol. HDL cholesterol is the type of cholesterol that protects against heart disease. High levels of HDL remove the LDL buildup from arteries. High levels of triglycerides. Triglycerides are a fatty substance in the blood that is linked to a buildup of plaques in the arteries. What are the causes? There are two main types of dyslipidemia: primary and secondary. Primary dyslipidemia is caused by changes (mutations) in genes that are passed down through families (inherited). These mutations cause several types of dyslipidemia. Secondary dyslipidemia may be caused by various risk factors that can lead to the disease, such as lifestyle choices and certain medical conditions. What increases the risk? You are more likely to develop this condition if you are an older man or if you are a woman who has gone through menopause. Other risk factors include: Having a family history of dyslipidemia. Taking certain medicines, including birth control pills, steroids, some diuretics, and beta-blockers. Eating a diet high in saturated fat. Smoking cigarettes or excessive alcohol intake. Having certain medical conditions such as diabetes, polycystic ovary syndrome (PCOS), kidney disease, liver disease, or hypothyroidism.  Not exercising regularly. Being overweight or obese with too much belly fat. What are the signs or symptoms? In most cases, dyslipidemia does not usually cause any symptoms. In severe cases, very high lipid levels can cause: Fatty bumps under the skin (xanthomas). A white or gray ring around the black center (pupil) of the eye. Very high  triglyceride levels can cause inflammation of the pancreas (pancreatitis). How is this diagnosed? Your health care provider may diagnose dyslipidemia based on a routine blood test (fasting blood test). Because most people do not have symptoms of the condition, this blood testing (lipid profile) is done on adults age 18 and older and is repeated every 4-6 years. This test checks: Total cholesterol. This measures the total amount of cholesterol in your blood, including LDL cholesterol, HDL cholesterol, and triglycerides. A healthy number is below 200 mg/dL (5.17 mmol/L). LDL cholesterol. The target number for LDL cholesterol is different for each person, depending on individual risk factors. A healthy number is usually below 100 mg/dL (2.59 mmol/L). Ask your health care provider what your LDL cholesterol should be. HDL cholesterol. An HDL level of 60 mg/dL (1.55 mmol/L) or higher is best because it helps to protect against heart disease. A number below 40 mg/dL (1.03 mmol/L) for men or below 50 mg/dL (1.29 mmol/L) for women increases the risk for heart disease. Triglycerides. A healthy triglyceride number is below 150 mg/dL (1.69 mmol/L). If your lipid profile is abnormal, your health care provider may do other blood tests. How is this treated? Treatment depends on the type of dyslipidemia that you have and your other risk factors for heart disease and stroke. Your health care provider will have a target range for your lipid levels based on this information. Treatment for dyslipidemia starts with lifestyle changes, such as diet and exercise. Your health care provider may recommend that you: Get regular exercise. Make changes to your diet. Quit smoking if you smoke. Limit your alcohol intake. If diet changes and exercise do not help you reach your goals, your health care provider may also prescribe medicine to lower lipids. The most commonly prescribed type of medicine lowers your LDL cholesterol (statin  drug). If you have a high triglyceride level, your provider may prescribe another type of drug (fibrate) or an omega-3 fish oil supplement, or both. Follow these instructions at home: Eating and drinking  Follow instructions from your health care provider or dietitian about eating or drinking restrictions. Eat a healthy diet as told by your health care provider. This can help you reach and maintain a healthy weight, lower your LDL cholesterol, and raise your HDL cholesterol. This may include: Limiting your calories, if you are overweight. Eating more fruits, vegetables, whole grains, fish, and lean meats. Limiting saturated fat, trans fat, and cholesterol. Do not drink alcohol if: Your health care provider tells you not to drink. You are pregnant, may be pregnant, or are planning to become pregnant. If you drink alcohol: Limit how much you have to: 0-1 drink a day for women. 0-2 drinks a day for men. Know how much alcohol is in your drink. In the [place] one drink equals one 12 oz bottle of beer (355 mL), one 5 oz glass of wine (148 mL), or one 1 oz glass of hard liquor (44 mL). Activity Get regular exercise. Start an exercise and strength training program as told by your health care provider. Ask your health care provider what activities are safe for you. Your health care provider may recommend:  30 minutes of aerobic activity 4-6 days a week. Brisk walking is an example of aerobic activity. Strength training 2 days a week. General instructions Do not use any products that contain nicotine or tobacco. These products include cigarettes, chewing tobacco, and vaping devices, such as e-cigarettes. If you need help quitting, ask your health care provider. Take over-the-counter and prescription medicines only as told by your health care provider. This includes supplements. Keep all follow-up visits. This is important. Contact a health care provider if: You are having trouble sticking to your  exercise or diet plan. You are struggling to quit smoking or to control your use of alcohol. Summary Dyslipidemia often involves a high level of cholesterol or triglycerides, which are types of lipids. Treatment depends on the type of dyslipidemia that you have and your other risk factors for heart disease and stroke. Treatment for dyslipidemia starts with lifestyle changes, such as diet and exercise. Your health care provider may prescribe medicine to lower lipids. This information is not intended to replace advice given to you by your health care provider. Make sure you discuss any questions you have with your health care provider. Document Revised: 01/08/2021 Document Reviewed: 01/08/2021 Elsevier Patient Education  2022 [employer].

## 2022-01-22 ENCOUNTER — Other Ambulatory Visit: Payer: Self-pay | Admitting: Medical

## 2022-01-22 DIAGNOSIS — R7309 Other abnormal glucose: Secondary | ICD-10-CM

## 2022-01-23 ENCOUNTER — Other Ambulatory Visit: Payer: Self-pay

## 2022-01-23 MED ORDER — FENOFIBRATE 145 MG PO TABS: 145.0000 mg | ORAL_TABLET | Freq: Every day | ORAL | 0 refills | Status: DC

## 2022-01-23 MED ORDER — EZETIMIBE 10 MG PO TABS: 10.0000 mg | ORAL_TABLET | Freq: Every day | ORAL | 0 refills | Status: DC

## 2022-01-23 MED ORDER — METOPROLOL SUCCINATE ER 25 MG PO TB24: 25.0000 mg | ORAL_TABLET | Freq: Every day | ORAL | 0 refills | Status: DC

## 2022-02-09 ENCOUNTER — Other Ambulatory Visit: Payer: Self-pay | Admitting: Medical

## 2022-02-09 DIAGNOSIS — I10 Essential (primary) hypertension: Secondary | ICD-10-CM

## 2022-02-13 ENCOUNTER — Ambulatory Visit: Payer: Self-pay | Admitting: Nurse Practitioner

## 2022-02-13 DIAGNOSIS — J3089 Other allergic rhinitis: Secondary | ICD-10-CM

## 2022-02-13 DIAGNOSIS — R0982 Postnasal drip: Secondary | ICD-10-CM

## 2022-02-13 NOTE — Progress Notes (Signed)
[facility] ?[address]  ?[place], [place] ? ? ?Office Visit Note ? ?Patient Name: Cory Houston ? [id number]  ?[id number] ? ?Date of Service: 02/13/2022 ? ?No chief complaint on file. ? ? ? ?HPI ?Patient is seen via telephone today for increased allergy symptoms.  Cory Houston is a 66 yo gentlemen who works for the grounds department here at [employer] so he is outside all day.  He takes one claritin in the morning and does flonase in the evening.  His allergie are manageable during the day, but at night he is having increased symptoms and is waking up multiple times with PND.  He is looking for solutions.   ? ? ? ? ?Current Medication: ? ?Outpatient Encounter Medications as of 02/13/2022  ?Medication Sig  ? amLODipine (NORVASC) 10 MG tablet TAKE 1 TABLET BY MOUTH EVERY DAY  ? Cholecalciferol (VITAMIN D-3) 5000 units TABS Take 1 tablet by mouth every other day.   ? ezetimibe (ZETIA) 10 MG tablet Take 1 tablet (10 mg total) by mouth daily.  ? fenofibrate (TRICOR) 145 MG tablet Take 1 tablet (145 mg total) by mouth daily.  ? icosapent Ethyl (VASCEPA) 1 g capsule Take 2 capsules (2 g total) by mouth 2 (two) times daily.  ? JARDIANCE 10 MG TABS tablet TAKE 1 TABLET BY MOUTH EVERY DAY BEFORE BREAKFAST  ? loratadine (CLARITIN) 10 MG tablet TAKE 1 TABLET BY MOUTH EVERY DAY  ? metFORMIN (GLUCOPHAGE-XR) 500 MG 24 hr tablet TAKE 2 TABLETS BY MOUTH TWICE A DAY  ? metoprolol succinate (TOPROL XL) 25 MG 24 hr tablet Take 1 tablet (25 mg total) by mouth daily.  ? Multiple Vitamin (MULTIVITAMIN) tablet Take 1 tablet by mouth daily.  ? omeprazole (PRILOSEC) 20 MG capsule TAKE 1 CAPSULE BY MOUTH EVERY DAY  ? ramipril (ALTACE) 5 MG capsule TAKE 1 CAPSULE BY MOUTH DAILY PLEASE CALL OFFICE TO SCHEDULE LABS NOW & BLOOD PRESSURE RECHECK AFTER  ? triamcinolone cream (KENALOG) 0.1 % Apply 1 application topically 2 (two) times daily. To affected area.  ? ?No facility-administered encounter medications on file as of  02/13/2022.  ? ? ? ? ?Medical History: ?Past Medical History:  ?Diagnosis Date  ? Allergy   ? Arthritis   ? Diabetes mellitus without complication ([place])   ? GERD (gastroesophageal reflux disease)   ? Hyperlipidemia   ? Hypertension   ? Sleep apnea   ? ? ? ?Vital Signs: ?There were no vitals taken for this visit. ? ? ?Review of Systems  ?HENT:  Positive for postnasal drip, sinus pressure and sneezing.   ?Eyes:  Positive for itching.  ?Respiratory:  Negative for shortness of breath and wheezing.   ? ?Physical Exam ?Virtual visit, no PE performed ? ? ?Assessment/Plan: ?1. Seasonal allergic rhinitis due to other allergic trigger ?Add a second dose of Claritin in the evening.  Also increase Flonase to 2 sprays each nostril TWICE daily.  Follow up as needed. ? ?2. PND (post-nasal drip) ?Add a second dose of claritin in the evening.  Also increase Flonase to 2 sprays each nostril TWICE daily.  Follow up as needed. ?   ? ?General Counseling: Cory Houston verbalizes understanding of the findings of todays visit and agrees with plan of treatment. I have discussed any further diagnostic evaluation that may be needed or ordered today. We also reviewed his medications today. he has been encouraged to call the office with any questions or concerns that should arise related to  todays visit. ? ? ?No orders of the defined types were placed in this encounter. ? ? ?No orders of the defined types were placed in this encounter. ? ? ?Time spent:20 Minutes ? ? ? ?Kendell Bane AGNP-C ?Nurse Practitioner ?

## 2022-03-01 ENCOUNTER — Ambulatory Visit: Payer: BC Managed Care – PPO | Admitting: Cardiology

## 2022-03-07 ENCOUNTER — Other Ambulatory Visit: Payer: Self-pay | Admitting: Nurse Practitioner

## 2022-03-07 DIAGNOSIS — I10 Essential (primary) hypertension: Secondary | ICD-10-CM

## 2022-03-11 ENCOUNTER — Other Ambulatory Visit (INDEPENDENT_AMBULATORY_CARE_PROVIDER_SITE_OTHER): Payer: BC Managed Care – PPO

## 2022-03-11 DIAGNOSIS — E781 Pure hyperglyceridemia: Secondary | ICD-10-CM

## 2022-03-15 ENCOUNTER — Ambulatory Visit: Payer: BC Managed Care – PPO | Admitting: Cardiology

## 2022-03-15 ENCOUNTER — Encounter: Payer: Self-pay | Admitting: Cardiology

## 2022-03-15 VITALS — BP 110/62 | HR 63 | Ht 71.0 in | Wt 222.0 lb

## 2022-03-15 DIAGNOSIS — I10 Essential (primary) hypertension: Secondary | ICD-10-CM

## 2022-03-15 DIAGNOSIS — E781 Pure hyperglyceridemia: Secondary | ICD-10-CM | POA: Diagnosis not present

## 2022-03-15 DIAGNOSIS — I493 Ventricular premature depolarization: Secondary | ICD-10-CM

## 2022-03-15 LAB — LIPID PANEL
Chol/HDL Ratio: 8.2 ratio — ABNORMAL HIGH (ref 0.0–5.0)
Cholesterol, Total: 140 mg/dL (ref 100–199)
HDL: 17 mg/dL — ABNORMAL LOW (ref >39)

## 2022-03-15 NOTE — Patient Instructions (Signed)
Medication Instructions:  ? ?Your physician recommends that you continue on your current medications as directed. Please refer to the Current Medication list given to you today. ? ?*If you need a refill on your cardiac medications before your next appointment, please call your pharmacy* ? ? ?Lab Work: ? ?Your physician recommends that you return for a FASTING lipid profile: Monday 03/18/22 ? ?- You will need to be fasting. Please do not have anything to eat or drink after midnight the morning you have the lab work. You may only have water or black coffee with no cream or sugar.  ? ?- Please go to the [facility]. You will check in at the front desk to the right as you walk into the atrium. Valet Parking is offered if needed. ?- No appointment needed. You may go any day between 7 am and 6 pm. ? ? ? ? ?Testing/Procedures: ? ?None ordered ? ? ?Follow-Up: ?At [facility], you and your health needs are our priority.  As part of our continuing mission to provide you with exceptional heart care, we have created designated Provider Care Teams.  These Care Teams include your primary Cardiologist (physician) and Advanced Practice Providers (APPs -  Physician Assistants and Nurse Practitioners) who all work together to provide you with the care you need, when you need it. ? ?We recommend signing up for the patient portal called "MyChart".  Sign up information is provided on this After Visit Summary.  MyChart is used to connect with patients for Virtual Visits (Telemedicine).  Patients are able to view lab/test results, encounter notes, upcoming appointments, etc.  Non-urgent messages can be sent to your provider as well.   ?To learn more about what you can do with MyChart, go to [email or web address].   ? ?Your next appointment:   ?3 month(s) ? ?The format for your next appointment:   ?In Person ? ?Provider:   ?You may see [name], MD or one of the following Advanced Practice Providers on your designated  Care Team:   ?Murray Hodgkins, NP ?Christell Faith, PA-C ?Cadence Kathlen Mody, PA-C  ? ? ?Other Instructions ? ? ?Important Information About Sugar ? ? ? ? ?  ?

## 2022-03-15 NOTE — Progress Notes (Signed)
?Cardiology Office Note:   ? ?Date:  03/15/2022  ? ?ID:  [name], DOB 1956-11-05, MRN [id number] ? ?PCP:  [name], PA-C ?  ?[place]  ?Cardiologist:  [name], MD  ?Advanced Practice Provider:  No care team member to display ?Electrophysiologist:  None  ?    ? ?Referring MD: Marcy Salvo*  ? ?Chief Complaint  ?Patient presents with  ? Other  ?  3 month follow up -- Meds reviewed verbally with patient.   ? ? ?History of Present Illness:   ? ?Cory Houston is a 66 y.o. male with a hx of hypertension, hypertriglyceridemia, diabetes presents for follow-up.  Previously seen due to palpitations and elevated triglycerides.  Toprol-XL was started with good effect.  Currently denies palpitations.  BP well controlled.  Repeat lipid panel obtained 4 days ago, there was lab error, not able to analyze.  He otherwise feels well, is working on eating healthier.  Takes all medications as prescribed.  Has no concerns at this time. ? ?Prior notes ?Cardiac monitor 02/2021 frequent PVCs, 2.3% PVC burden. ? ? ?Past Medical History:  ?Diagnosis Date  ? Allergy   ? Arthritis   ? Diabetes mellitus without complication (Cory Houston)   ? GERD (gastroesophageal reflux disease)   ? Hyperlipidemia   ? Hypertension   ? Sleep apnea   ? ? ?Past Surgical History:  ?Procedure Laterality Date  ? VASECTOMY    ? ? ?Current Medications: ?Current Meds  ?Medication Sig  ? amLODipine (NORVASC) 10 MG tablet TAKE 1 TABLET BY MOUTH EVERY DAY  ? Cholecalciferol (VITAMIN D-3) 5000 units TABS Take 1 tablet by mouth every other day.   ? ezetimibe (ZETIA) 10 MG tablet Take 1 tablet (10 mg total) by mouth daily.  ? fenofibrate (TRICOR) 145 MG tablet Take 1 tablet (145 mg total) by mouth daily.  ? icosapent Ethyl (VASCEPA) 1 g capsule Take 2 capsules (2 g total) by mouth 2 (two) times daily.  ? JARDIANCE 10 MG TABS tablet TAKE 1 TABLET BY MOUTH EVERY DAY BEFORE BREAKFAST  ? loratadine (CLARITIN) 10 MG tablet TAKE  1 TABLET BY MOUTH EVERY DAY  ? metFORMIN (GLUCOPHAGE-XR) 500 MG 24 hr tablet TAKE 2 TABLETS BY MOUTH TWICE A DAY  ? metoprolol succinate (TOPROL XL) 25 MG 24 hr tablet Take 1 tablet (25 mg total) by mouth daily.  ? Multiple Vitamin (MULTIVITAMIN) tablet Take 1 tablet by mouth daily.  ? omeprazole (PRILOSEC) 20 MG capsule TAKE 1 CAPSULE BY MOUTH EVERY DAY  ? ramipril (ALTACE) 5 MG capsule TAKE 1 CAPSULE BY MOUTH DAILY PLEASE CALL OFFICE TO SCHEDULE LABS NOW & BLOOD PRESSURE RECHECK AFTER  ? triamcinolone cream (KENALOG) 0.1 % Apply 1 application topically 2 (two) times daily. To affected area.  ?  ? ?Allergies:   Codeine  ? ?Social History  ? ?Socioeconomic History  ? Marital status: Married  ?  Spouse name: Not on file  ? Number of children: Not on file  ? Years of education: Not on file  ? Highest education level: Not on file  ?Occupational History  ? Not on file  ?Tobacco Use  ? Smoking status: Never  ? Smokeless tobacco: Never  ?Vaping Use  ? Vaping Use: Never used  ?Substance and Sexual Activity  ? Alcohol use: Yes  ?  Comment: rarely 1 per month  ? Drug use: No  ? Sexual activity: Yes  ?Other Topics Concern  ? Not on file  ?  Social History Narrative  ? Not on file  ? ?Social Determinants of Health  ? ?Financial Resource Strain: Not on file  ?Food Insecurity: Not on file  ?Transportation Needs: Not on file  ?Physical Activity: Not on file  ?Stress: Not on file  ?Social Connections: Not on file  ?  ? ?Family History: ?The patient's family history includes Cataracts in his sister; Congestive Heart Failure in his father and mother; Diabetes in his father and mother; Diverticulitis in his father; Heart attack in his maternal grandmother; Heart disease in his mother; Heart murmur in his son; Hypercholesterolemia in his father and mother; Hypertension in his brother, father, mother, paternal aunt, and paternal uncle; Lung cancer in his maternal grandmother; Obesity in his mother; Stroke in his paternal grandfather and  paternal grandmother. ? ?ROS:   ?Please see the history of present illness.    ? All other systems reviewed and are negative. ? ?EKGs/Labs/Other Studies Reviewed:   ? ?The following studies were reviewed today: ? ? ?EKG:  EKG not ordered today.   ? ?Recent Labs: ?11/01/2021: ALT 71; BUN 21; Creatinine, Ser 1.10; Hemoglobin 17.5; Platelets 252; Potassium 4.9; Sodium 136; TSH 1.830  ?Recent Lipid Panel ?   ?Component Value Date/Time  ? CHOL 140 03/11/2022 0910  ? TRIG WILL FOLLOW 03/11/2022 0910  ? HDL 17 (L) 03/11/2022 0910  ? CHOLHDL 8.2 (H) 03/11/2022 0910  ? CHOLHDL 6.2 08/15/2021 0823  ? VLDL NOT CALCULATED 08/15/2021 0823  ? [place] WILL FOLLOW 03/11/2022 0910  ? LDLDIRECT 65.7 08/15/2021 0823  ? ? ? ?Risk Assessment/Calculations:   ? ? ? ?Physical Exam:   ? ?VS:  BP 110/62 (BP Location: Left Arm, Patient Position: Sitting, Cuff Size: Normal)   Pulse 63   Ht '5\' 11"'$  (1.803 m)   Wt 222 lb (100.7 kg)   SpO2 98%   BMI 30.96 kg/m?    ? ?Wt Readings from Last 3 Encounters:  ?03/15/22 222 lb (100.7 kg)  ?01/21/22 223 lb (101.2 kg)  ?12/11/21 226 lb 12.8 oz (102.9 kg)  ?  ? ?GEN:  Well nourished, well developed in no acute distress ?HEENT: Normal ?NECK: No JVD; No carotid bruits ?LYMPHATICS: No lymphadenopathy ?CARDIAC: RRR, no murmurs, rubs, gallops ?RESPIRATORY:  Clear to auscultation without rales, wheezing or rhonchi  ?ABDOMEN: Soft, non-tender, non-distended ?MUSCULOSKELETAL:  No edema; No deformity  ?SKIN: Warm and dry ?NEUROLOGIC:  Alert and oriented x 3 ?PSYCHIATRIC:  Normal affect  ? ?ASSESSMENT:   ? ?1. PVC's (premature ventricular contractions)   ?2. Pure hypertriglyceridemia   ?3. Primary hypertension   ? ?PLAN:   ? ?In order of problems listed above: ? 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  Symptoms improved after starting Toprol-XL, continue Toprol-XL 25 mg daily. ?hypertriglyceridemia.  Not able to analyze lipid panel.  Repeat fasting lipid panel.  Low-cholesterol diet was emphasized.  Continue Zetia 10  mg daily, Vascepa,  fenofibrate.  We will plan to start statin if cholesterol not controlled.Cory Houston ?Hypertension, BP controlled.  Continue Norvasc 10 and ramipril '5mg'$ . ? ?Follow-up in 3 months ?  ? ? ?Medication Adjustments/Labs and Tests Ordered: ?Current medicines are reviewed at length with the patient today.  Concerns regarding medicines are outlined above.  ?Orders Placed This Encounter  ?Procedures  ? Lipid panel  ? ? ?No orders of the defined types were placed in this encounter. ? ? ? ?Patient Instructions  ?Medication Instructions:  ? ?Your physician recommends that you continue on your current medications as directed. Please refer  to the Current Medication list given to you today. ? ?*If you need a refill on your cardiac medications before your next appointment, please call your pharmacy* ? ? ?Lab Work: ? ?Your physician recommends that you return for a FASTING lipid profile: Monday 03/18/22 ? ?- You will need to be fasting. Please do not have anything to eat or drink after midnight the morning you have the lab work. You may only have water or black coffee with no cream or sugar.  ? ?- Please go to the [facility]. You will check in at the front desk to the right as you walk into the atrium. Valet Parking is offered if needed. ?- No appointment needed. You may go any day between 7 am and 6 pm. ? ? ? ? ?Testing/Procedures: ? ?None ordered ? ? ?Follow-Up: ?At [facility], you and your health needs are our priority.  As part of our continuing mission to provide you with exceptional heart care, we have created designated Provider Care Teams.  These Care Teams include your primary Cardiologist (physician) and Advanced Practice Providers (APPs -  Physician Assistants and Nurse Practitioners) who all work together to provide you with the care you need, when you need it. ? ?We recommend signing up for the patient portal called "MyChart".  Sign up information is provided on this After Visit Summary.  MyChart is  used to connect with patients for Virtual Visits (Telemedicine).  Patients are able to view lab/test results, encounter notes, upcoming appointments, etc.  Non-urgent messages can be sent to your provider a

## 2022-03-21 ENCOUNTER — Ambulatory Visit: Payer: Self-pay | Admitting: Nurse Practitioner

## 2022-03-21 ENCOUNTER — Encounter: Payer: Self-pay | Admitting: Nurse Practitioner

## 2022-03-21 VITALS — BP 118/80 | HR 83 | Temp 98.7°F | Resp 16

## 2022-03-21 DIAGNOSIS — W57XXXA Bitten or stung by nonvenomous insect and other nonvenomous arthropods, initial encounter: Secondary | ICD-10-CM

## 2022-03-21 MED ORDER — DOXYCYCLINE HYCLATE 100 MG PO TABS: 100.0000 mg | ORAL_TABLET | Freq: Two times a day (BID) | ORAL | 0 refills | Status: AC

## 2022-03-21 NOTE — Progress Notes (Signed)
[employer]  ?[facility] ?[address]  ?[place], [place] [address] ?Phone [phone] ?Fax  [phone] ? ?Worker's Compensation Report Form  ? 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 ?Date of Injury:03/19/2022 ?Time of Injury:7pm time that it was notices ?Shift Worked: first shift ?Location where injury occurred (address or landmark):[employer] grounds ? ?Body Part Injured:right back of axilla ? ?Vital Signs ?Today's Vitals  ? 03/21/22 1034  ?BP: 118/80  ?Pulse: 83  ?Resp: 16  ?Temp: 98.7 ?F (37.1 ?C)  ?TempSrc: Tympanic  ?SpO2: 98%  ? ?There is no height or weight on file to calculate BMI.  ?Injury Description  ?He was mowing the lawn at work and found a tick on himself that night 03/19/22. Tick was removed and alive when removed from left armpit. Since that time the area around the bite has become red and tender. Denies fever. He is a Librarian, academic for [employer] ? ? ?Provider Note ?66 year old male presenting to clinic after tick bite to left axillary region. Tick was on 2 days ago. Was attached for less than 24 hours. Was alive and intact when removed. Dime sized area of erythema present now to left axillary region surrounding bite. Negative for bullseye rash. Denies fever or other systemic symptoms.  ? ? ?Diagnosis  ?1. Tick bite, unspecified site, initial encounter ? ?- doxycycline (VIBRA-TABS) 100 MG tablet; Take 1 tablet (100 mg total) by mouth 2 (two) times daily for 10 days.  Dispense: 20 tablet; Refill: 0 ?   ?Medications Prescribed  ?Meds ordered this encounter  ?Medications  ? doxycycline (VIBRA-TABS) 100 MG tablet  ?  Sig: Take 1 tablet (100 mg total) by mouth 2 (two) times daily for 10 days.  ?  Dispense:  20 tablet  ?  Refill:  0  ?  ? ?Referred to N/A ? ? ? ?Return to Work Status ?Return to work without  restrictions  ? ? ?Provider Signature ________________________________________Date_________ ? ? ?Employee Signature _______________________________________Date_________ ? ? ?Please email this completed form to [name], Director of Risk Management at [email or web address] within 24 hours of visit.   ?

## 2022-04-03 ENCOUNTER — Other Ambulatory Visit: Payer: Self-pay

## 2022-04-03 MED ORDER — METOPROLOL SUCCINATE ER 25 MG PO TB24: 25.0000 mg | ORAL_TABLET | Freq: Every day | ORAL | 1 refills | Status: DC

## 2022-04-03 MED ORDER — EZETIMIBE 10 MG PO TABS: 10.0000 mg | ORAL_TABLET | Freq: Every day | ORAL | 1 refills | Status: DC

## 2022-05-06 ENCOUNTER — Other Ambulatory Visit: Payer: Self-pay | Admitting: Medical

## 2022-05-07 ENCOUNTER — Other Ambulatory Visit: Payer: Self-pay

## 2022-05-07 MED ORDER — METOPROLOL SUCCINATE ER 25 MG PO TB24: 25.0000 mg | ORAL_TABLET | Freq: Every day | ORAL | 0 refills | Status: DC

## 2022-05-07 MED ORDER — EZETIMIBE 10 MG PO TABS: 10.0000 mg | ORAL_TABLET | Freq: Every day | ORAL | 0 refills | Status: DC

## 2022-05-15 ENCOUNTER — Encounter: Payer: Self-pay | Admitting: Adult Health

## 2022-05-15 ENCOUNTER — Ambulatory Visit: Payer: Self-pay | Admitting: Adult Health

## 2022-05-15 VITALS — BP 118/72 | HR 86 | Temp 98.4°F | Wt 226.0 lb

## 2022-05-15 DIAGNOSIS — M62838 Other muscle spasm: Secondary | ICD-10-CM

## 2022-05-15 NOTE — Progress Notes (Signed)
[facility] [address], [place] [address]   Office Visit Note  Patient Name: Cory Houston Date of Birth [id number]  Medical Record number [id number]  Date of Service: 05/15/2022  Chief Complaint  Patient presents with   GI Problem    X2 weeks     GI Problem Primary symptoms do not include fever or fatigue.   Pt is here for acute visit.  He reports for the last two weeks, after dinner while sitting on the couch he has noticed some upper abdominal muscle spasms.  They last a few minutes.  He denies any other pain, nausea, vomiting, diarrhea or issues.    Current Medication:  Outpatient Encounter Medications as of 05/15/2022  Medication Sig   amLODipine (NORVASC) 10 MG tablet TAKE 1 TABLET BY MOUTH EVERY DAY   Cholecalciferol (VITAMIN D-3) 5000 units TABS Take 1 tablet by mouth every other day.    ezetimibe (ZETIA) 10 MG tablet Take 1 tablet (10 mg total) by mouth daily.   fenofibrate (TRICOR) 145 MG tablet Take 1 tablet (145 mg total) by mouth daily.   icosapent Ethyl (VASCEPA) 1 g capsule Take 2 capsules (2 g total) by mouth 2 (two) times daily.   JARDIANCE 10 MG TABS tablet TAKE 1 TABLET BY MOUTH EVERY DAY BEFORE BREAKFAST   loratadine (CLARITIN) 10 MG tablet TAKE 1 TABLET BY MOUTH EVERY DAY   metFORMIN (GLUCOPHAGE-XR) 500 MG 24 hr tablet TAKE 2 TABLETS BY MOUTH TWICE A DAY   metoprolol succinate (TOPROL XL) 25 MG 24 hr tablet Take 1 tablet (25 mg total) by mouth daily.   Multiple Vitamin (MULTIVITAMIN) tablet Take 1 tablet by mouth daily.   omeprazole (PRILOSEC) 20 MG capsule TAKE 1 CAPSULE BY MOUTH EVERY DAY   ramipril (ALTACE) 5 MG capsule TAKE 1 CAPSULE BY MOUTH DAILY PLEASE CALL OFFICE TO SCHEDULE LABS NOW & BLOOD PRESSURE RECHECK AFTER   triamcinolone cream (KENALOG) 0.1 % Apply 1 application topically 2 (two) times daily. To affected area.   No facility-administered encounter medications on file as of 05/15/2022.      Medical History: Past Medical  History:  Diagnosis Date   Allergy    Arthritis    Diabetes mellitus without complication ([facility])    GERD (gastroesophageal reflux disease)    Hyperlipidemia    Hypertension    Sleep apnea      Vital Signs: BP 118/72   Pulse 86   Temp 98.4 F (36.9 C) (Tympanic)   Wt 226 lb (102.5 kg)   SpO2 98%   BMI 31.52 kg/m    Review of Systems  Constitutional:  Negative for fatigue and fever.  All other systems reviewed and are negative.   Physical Exam Vitals and nursing note reviewed.  Constitutional:      Appearance: Normal appearance.  Neurological:     Mental Status: He is alert.    Assessment/Plan: 1. Muscle spasm Discussed watch and wait to see if symptoms worsen or resolve.  Discussed muscle pulling, as well as dehydration as he works outside all day every day.  Follow up via MyChart messenger if symptoms fail to improve or may return to clinic as needed for worsening symptoms.       General Counseling: [name] verbalizes understanding of the findings of todays visit and agrees with plan of treatment. I have discussed any further diagnostic evaluation that may be needed or ordered today. We also reviewed his medications today. he has been encouraged to call  the office with any questions or concerns that should arise related to todays visit.   No orders of the defined types were placed in this encounter.   No orders of the defined types were placed in this encounter.   Time spent:20 Minutes    Kendell Bane AGNP-C Nurse Practitioner

## 2022-05-29 ENCOUNTER — Other Ambulatory Visit: Payer: Self-pay

## 2022-05-29 ENCOUNTER — Encounter: Payer: Self-pay | Admitting: Medical

## 2022-05-29 DIAGNOSIS — Z0000 Encounter for general adult medical examination without abnormal findings: Secondary | ICD-10-CM

## 2022-05-29 DIAGNOSIS — E781 Pure hyperglyceridemia: Secondary | ICD-10-CM

## 2022-05-29 LAB — POCT URINALYSIS DIPSTICK
Bilirubin, UA: NEGATIVE
Blood, UA: NEGATIVE
Glucose, UA: POSITIVE — AB
Leukocytes, UA: NEGATIVE
Nitrite, UA: NEGATIVE
Protein, UA: NEGATIVE
Spec Grav, UA: 1.015 (ref 1.010–1.025)
Urobilinogen, UA: 0.2 E.U./dL
pH, UA: 5 (ref 5.0–8.0)

## 2022-05-30 ENCOUNTER — Telehealth: Payer: Self-pay

## 2022-05-30 DIAGNOSIS — E781 Pure hyperglyceridemia: Secondary | ICD-10-CM

## 2022-05-30 LAB — LIPID PANEL
Chol/HDL Ratio: 8.4 ratio — ABNORMAL HIGH (ref 0.0–5.0)
Cholesterol, Total: 142 mg/dL (ref 100–199)
HDL: 17 mg/dL — ABNORMAL LOW (ref >39)
Triglycerides: 989 mg/dL (ref 0–149)

## 2022-05-30 MED ORDER — ROSUVASTATIN CALCIUM 40 MG PO TABS: 40.0000 mg | ORAL_TABLET | Freq: Every day | ORAL | 3 refills | Status: DC

## 2022-05-30 NOTE — Telephone Encounter (Signed)
-----   Message from [name], MD sent at 05/30/2022  8:13 AM EDT ----- Triglycerides still elevated, start Crestor 40 mg daily, please refer to lipid clinic.

## 2022-05-30 NOTE — Telephone Encounter (Signed)
The patient has been notified of the result and verbalized understanding.  All questions (if any) were answered. Kavin Leech, RN 05/30/2022 1:46 PM '

## 2022-05-31 ENCOUNTER — Other Ambulatory Visit: Payer: Self-pay

## 2022-05-31 MED ORDER — EZETIMIBE 10 MG PO TABS
10.0000 mg | ORAL_TABLET | Freq: Every day | ORAL | 3 refills | Status: DC
Start: 2022-05-31 — End: 2022-09-02

## 2022-05-31 MED ORDER — METOPROLOL SUCCINATE ER 25 MG PO TB24: 25.0000 mg | ORAL_TABLET | Freq: Every day | ORAL | 3 refills | Status: DC

## 2022-06-01 LAB — CMP12+LP+TP+TSH+6AC+PSA+CBC…
ALT: 59 IU/L — ABNORMAL HIGH (ref 0–44)
AST: 40 IU/L (ref 0–40)
Albumin/Globulin Ratio: 1.6 (ref 1.2–2.2)
Albumin: 4.5 g/dL (ref 3.9–4.9)
Alkaline Phosphatase: 141 IU/L — ABNORMAL HIGH (ref 44–121)
BUN/Creatinine Ratio: 18 (ref 10–24)
BUN: 17 mg/dL (ref 8–27)
Basophils Absolute: 0 10*3/uL (ref 0.0–0.2)
Basos: 0 %
Bilirubin Total: 0.5 mg/dL (ref 0.0–1.2)
Calcium: 8.9 mg/dL (ref 8.6–10.2)
Chloride: 102 mmol/L (ref 96–106)
Chol/HDL Ratio: 7.6 ratio — ABNORMAL HIGH (ref 0.0–5.0)
Cholesterol, Total: 144 mg/dL (ref 100–199)
Creatinine, Ser: 0.95 mg/dL (ref 0.76–1.27)
EOS (ABSOLUTE): 0.1 10*3/uL (ref 0.0–0.4)
Eos: 2 %
Estimated CHD Risk: 1.6 times avg. — ABNORMAL HIGH (ref 0.0–1.0)
Free Thyroxine Index: 2.5 (ref 1.2–4.9)
GGT: 38 IU/L (ref 0–65)
Globulin, Total: 2.8 g/dL (ref 1.5–4.5)
Glucose: 175 mg/dL — ABNORMAL HIGH (ref 70–99)
HDL: 19 mg/dL — ABNORMAL LOW (ref >39)
Hematocrit: 49.9 % (ref 37.5–51.0)
Hemoglobin: 17.2 g/dL (ref 13.0–17.7)
Immature Grans (Abs): 0 10*3/uL (ref 0.0–0.1)
Immature Granulocytes: 0 %
Iron: 87 ug/dL (ref 38–169)
LDH: 267 IU/L — ABNORMAL HIGH (ref 121–224)
Lymphocytes Absolute: 1.7 10*3/uL (ref 0.7–3.1)
Lymphs: 28 %
MCH: 30.4 pg (ref 26.6–33.0)
MCHC: 34.5 g/dL (ref 31.5–35.7)
MCV: 88 fL (ref 79–97)
Monocytes Absolute: 0.6 10*3/uL (ref 0.1–0.9)
Monocytes: 9 %
Neutrophils Absolute: 3.7 10*3/uL (ref 1.4–7.0)
Neutrophils: 61 %
Phosphorus: 2.9 mg/dL (ref 2.8–4.1)
Platelets: 211 10*3/uL (ref 150–450)
Potassium: 4.4 mmol/L (ref 3.5–5.2)
Prostate Specific Ag, Serum: 0.2 ng/mL (ref 0.0–4.0)
RBC: 5.66 x10E6/uL (ref 4.14–5.80)
RDW: 14.2 % (ref 11.6–15.4)
Sodium: 136 mmol/L (ref 134–144)
T3 Uptake Ratio: 33 % (ref 24–39)
T4, Total: 7.5 ug/dL (ref 4.5–12.0)
TSH: 2.12 u[IU]/mL (ref 0.450–4.500)
Total Protein: 7.3 g/dL (ref 6.0–8.5)
Triglycerides: 974 mg/dL (ref 0–149)
Uric Acid: 7 mg/dL (ref 3.8–8.4)
WBC: 6.1 10*3/uL (ref 3.4–10.8)
eGFR: 90 mL/min/{1.73_m2} (ref >59)

## 2022-06-01 LAB — VITAMIN D 25 HYDROXY (VIT D DEFICIENCY, FRACTURES): Vit D, 25-Hydroxy: 25.2 ng/mL — ABNORMAL LOW (ref 30.0–100.0)

## 2022-06-01 LAB — HGB A1C W/O EAG: Hgb A1c MFr Bld: 7.8 % — ABNORMAL HIGH (ref 4.8–5.6)

## 2022-06-04 ENCOUNTER — Encounter: Payer: Self-pay | Admitting: Medical

## 2022-06-04 ENCOUNTER — Ambulatory Visit: Payer: Self-pay | Admitting: Medical

## 2022-06-04 VITALS — BP 130/80 | HR 85 | Temp 98.2°F | Wt 226.2 lb

## 2022-06-04 DIAGNOSIS — Z760 Encounter for issue of repeat prescription: Secondary | ICD-10-CM

## 2022-06-04 DIAGNOSIS — E1165 Type 2 diabetes mellitus with hyperglycemia: Secondary | ICD-10-CM

## 2022-06-04 DIAGNOSIS — Z0000 Encounter for general adult medical examination without abnormal findings: Secondary | ICD-10-CM

## 2022-06-04 DIAGNOSIS — R21 Rash and other nonspecific skin eruption: Secondary | ICD-10-CM

## 2022-06-04 MED ORDER — TRIAMCINOLONE ACETONIDE 0.1 % EX CREA: 1.0000 | TOPICAL_CREAM | Freq: Two times a day (BID) | CUTANEOUS | 0 refills | Status: AC

## 2022-06-04 NOTE — Patient Instructions (Addendum)
Mediterranean Diet A Mediterranean diet refers to food and lifestyle choices that are based on the traditions of countries located on the [employer]. It focuses on eating more fruits, vegetables, whole grains, beans, nuts, seeds, and heart-healthy fats, and eating less dairy, meat, eggs, and processed foods with added sugar, salt, and fat. This way of eating has been shown to help prevent certain conditions and improve outcomes for people who have chronic diseases, like kidney disease and heart disease. What are tips for following this plan? Reading food labels Check the serving size of packaged foods. For foods such as rice and pasta, the serving size refers to the amount of cooked product, not dry. Check the total fat in packaged foods. Avoid foods that have saturated fat or trans fats. Check the ingredient list for added sugars, such as corn syrup. Shopping  Buy a variety of foods that offer a balanced diet, including: Fresh fruits and vegetables (produce). Grains, beans, nuts, and seeds. Some of these may be available in unpackaged forms or large amounts (in bulk). Fresh seafood. Poultry and eggs. Low-fat dairy products. Buy whole ingredients instead of prepackaged foods. Buy fresh fruits and vegetables in-season from local farmers markets. Buy plain frozen fruits and vegetables. If you do not have access to quality fresh seafood, buy precooked frozen shrimp or canned fish, such as tuna, salmon, or sardines. Stock your pantry so you always have certain foods on hand, such as olive oil, canned tuna, canned tomatoes, rice, pasta, and beans. Cooking Cook foods with extra-virgin olive oil instead of using butter or other vegetable oils. Have meat as a side dish, and have vegetables or grains as your main dish. This means having meat in small portions or adding small amounts of meat to foods like pasta or stew. Use beans or vegetables instead of meat in common dishes like chili or  lasagna. Experiment with different cooking methods. Try roasting, broiling, steaming, and sauting vegetables. Add frozen vegetables to soups, stews, pasta, or rice. Add nuts or seeds for added healthy fats and plant protein at each meal. You can add these to yogurt, salads, or vegetable dishes. Marinate fish or vegetables using olive oil, lemon juice, garlic, and fresh herbs. Meal planning Plan to eat one vegetarian meal one day each week. Try to work up to two vegetarian meals, if possible. Eat seafood two or more times a week. Have healthy snacks readily available, such as: Vegetable sticks with hummus. Greek yogurt. Fruit and nut trail mix. Eat balanced meals throughout the week. This includes: Fruit: 2-3 servings a day. Vegetables: 4-5 servings a day. Low-fat dairy: 2 servings a day. Fish, poultry, or lean meat: 1 serving a day. Beans and legumes: 2 or more servings a week. Nuts and seeds: 1-2 servings a day. Whole grains: 6-8 servings a day. Extra-virgin olive oil: 3-4 servings a day. Limit red meat and sweets to only a few servings a month. Lifestyle  Cook and eat meals together with your family, when possible. Drink enough fluid to keep your urine pale yellow. Be physically active every day. This includes: Aerobic exercise like running or swimming. Leisure activities like gardening, walking, or housework. Get 7-8 hours of sleep each night. If recommended by your health care provider, drink red wine in moderation. This means 1 glass a day for nonpregnant women and 2 glasses a day for men. A glass of wine equals 5 oz (150 mL). What foods should I eat? Fruits Apples. Apricots. Avocado. Berries. Bananas. Cherries. Dates.  Figs. Grapes. Lemons. Melon. Oranges. Peaches. Plums. Pomegranate. Vegetables Artichokes. Beets. Broccoli. Cabbage. Carrots. Eggplant. Green beans. Chard. Kale. Spinach. Onions. Leeks. Peas. Squash. Tomatoes. Peppers. Radishes. Grains Whole-grain pasta. Brown  rice. Bulgur wheat. Polenta. Couscous. Whole-wheat bread. [place]. Meats and other proteins Beans. Almonds. Sunflower seeds. Pine nuts. Peanuts. [place]. Salmon. Scallops. Shrimp. [place]. Tilapia. Clams. Oysters. Eggs. Poultry without skin. Dairy Low-fat milk. Cheese. Greek yogurt. Fats and oils Extra-virgin olive oil. Avocado oil. Grapeseed oil. Beverages Water. Red wine. Herbal tea. Sweets and desserts Greek yogurt with honey. Baked apples. Poached pears. Trail mix. Seasonings and condiments Basil. Cilantro. Coriander. Cumin. Mint. Parsley. Sage. Rosemary. Tarragon. Garlic. Oregano. Thyme. Pepper. Balsamic vinegar. Tahini. Hummus. Tomato sauce. Olives. Mushrooms. The items listed above may not be a complete list of foods and beverages you can eat. Contact a dietitian for more information. What foods should I limit? This is a list of foods that should be eaten rarely or only on special occasions. Fruits Fruit canned in syrup. Vegetables Deep-fried potatoes (french fries). Grains Prepackaged pasta or rice dishes. Prepackaged cereal with added sugar. Prepackaged snacks with added sugar. Meats and other proteins Beef. Pork. Lamb. Poultry with skin. Hot dogs. [place] Salines. Dairy Ice cream. Sour cream. Whole milk. Fats and oils Butter. Canola oil. Vegetable oil. Beef fat (tallow). Lard. Beverages Juice. Sugar-sweetened soft drinks. Beer. Liquor and spirits. Sweets and desserts Cookies. Cakes. Pies. Candy. Seasonings and condiments Mayonnaise. Pre-made sauces and marinades. The items listed above may not be a complete list of foods and beverages you should limit. Contact a dietitian for more information. Summary The Mediterranean diet includes both food and lifestyle choices. Eat a variety of fresh fruits and vegetables, beans, nuts, seeds, and whole grains. Limit the amount of red meat and sweets that you eat. If recommended by your health care provider, drink red wine in moderation.  This means 1 glass a day for nonpregnant women and 2 glasses a day for men. A glass of wine equals 5 oz (150 mL). This information is not intended to replace advice given to you by your health care provider. Make sure you discuss any questions you have with your health care provider. Document Revised: 12/10/2019 Document Reviewed: 10/07/2019 Elsevier Patient Education  [place]. Triglycerides Test Why am I having this test? Triglycerides are a type of fat in the body. Having a high level of triglycerides can increase your risk for heart disease. You may have this test as part of a routine physical exam. Health care providers recommend that adults have this test at least once every 5 years. If you have risk factors for heart disease or are being treated for high triglycerides, you may need to have this test more often. What is being tested? This test measures the amount of triglycerides in your blood. Triglycerides are naturally present in the body, and you also take in triglycerides by eating certain foods. Triglycerides can come from refined carbohydrate foods, which are foods high in added sugars and low in nutrients and fiber, and from alcohol. Triglycerides store energy eaten at meals for use between meals. If you eat more than you burn, this extra energy can result in high triglycerides. Triglycerides may be measured as part of a test called a lipid profile, which tests triglycerides and cholesterol levels. What kind of sample is taken?  A blood sample is required for this test. It may be collected by inserting a needle into a blood vessel or by pricking a fingertip with a  small needle (finger stick). How do I prepare for this test? Follow instructions from your health care provider about changing or stopping your regular medicines. Do not eat or drink anything except water starting 9-12 hours before your test, or as long as told by your health care provider. Do not drink alcohol  starting at least 24 hours before your test. Follow any instructions from your health care provider about dietary restrictions before your test. Tell a health care provider about: All medicines you are taking, including vitamins, herbs, eye drops, creams, and over-the-counter medicines. Any bleeding problems you have. Any medical conditions you have. How are the results reported? Your test results will be reported as a value that indicates how many triglycerides are in your blood. This will be given as milligrams of triglycerides per deciliter of blood (mg/dL). Your health care provider will compare your results to normal values that were established after testing a large group of people (reference ranges). Reference ranges may vary among labs and hospitals. For this test, common reference ranges are: Adults: Male: 40-160 mg/dL or 0.45-1.81 mmol/L (SI units). Male: 35-135 mg/dL or 0.40-1.52 mmol/L (SI units). Teens 21-74 years old: Male: 40-163 mg/dL. Male: 40-128 mg/dL. Children 66-29 years old: Male: 36-138 mg/dL. Male: 41-138 mg/dL. Children 28-76 years old: Male: 31-108 mg/dL. Male: 35-114 mg/dL. Children 5 years or younger: Male: 30-86 mg/dL. Male: 32-99 mg/dL. What do the results mean? Results that are within the reference range are considered normal. This means that you have a normal amount of triglycerides in your blood. Results that are higher than your reference range mean that there are too many triglycerides in your blood. This may mean that you: Have a higher risk of heart disease. Have certain diseases that cause high triglycerides, such as diabetes. Are taking certain medicines such as estrogens and oral contraceptives. Talk with your health care provider about what your results mean. Questions to ask your health care provider Ask your health care provider, or the department that is doing the test: When will my results be ready? How will I get my  results? What are my treatment options? What other tests do I need? What are my next steps? Summary Triglycerides are a type of fat in the body. Having a high level of triglycerides can increase your risk for heart disease. You may have this test as part of a routine physical exam. Triglycerides may be measured as part of a test called a lipid profile, which tests triglycerides and cholesterol. Talk with your health care provider about what your results mean. This information is not intended to replace advice given to you by your health care provider. Make sure you discuss any questions you have with your health care provider. Document Revised: 02/14/2021 Document Reviewed: 02/14/2021 Elsevier Patient Education  [place]. Hemoglobin A1C Test Why am I having this test? You may have the hemoglobin A1C test (A1C test) done to: Evaluate your risk for developing diabetes (diabetes mellitus). Diagnose diabetes. Monitor long-term control of blood sugar (glucose) in people who have diabetes and help make treatment decisions. This test may be done with other blood glucose tests, such as fasting blood glucose and oral glucose tolerance tests. What is being tested? Hemoglobin is a type of protein in the blood that carries oxygen. Glucose attaches to hemoglobin to form glycated hemoglobin. This test checks the amount of glycated hemoglobin in your blood, which is a good indicator of the average amount of glucose in your blood during the  past 2-3 months. What kind of sample is taken?  A blood sample is required for this test. It is usually collected by inserting a needle into a blood vessel. Tell a health care provider about: All medicines you are taking, including vitamins, herbs, eye drops, creams, and over-the-counter medicines. Any blood disorders you have. Any surgeries you have had. Any medical conditions you have. Whether you are pregnant or may be pregnant. How are the results  reported? Your results will be reported as a percentage that indicates how much of your hemoglobin has glucose attached to it (is glycated). Your health care provider will compare your results to normal ranges that were established after testing a large group of people (reference ranges). Reference ranges may vary among labs and hospitals. For this test, common reference ranges are: Adult or child without diabetes: 4-5.6%. Adult or child with diabetes and good blood glucose control: less than 7%. What do the results mean? If you have diabetes: A result of less than 7% is considered normal, meaning that your blood glucose is well controlled. A result higher than 7% means that your blood glucose is not well controlled, and your treatment plan may need to be adjusted. If you do not have diabetes: A result within the reference range is considered normal, meaning that you are not at high risk for diabetes. A result of 5.7-6.4% means that you have a high risk of developing diabetes, and you have prediabetes. Prediabetes is the condition of having a blood glucose level that is higher than it should be but not high enough for you to be diagnosed with diabetes. Having prediabetes puts you at risk for developing type 2 diabetes. You may have more tests, including a repeat A1C test. Results of 6.5% or higher on two separate A1C tests mean that you have diabetes. You may have more tests to confirm the diagnosis. Abnormally low A1C values may be caused by: Pregnancy. Severe blood loss. Receiving donated blood (transfusions). Low red blood cell count (anemia). Long-term kidney failure. Some unusual forms (variants) of hemoglobin. Talk with your health care provider about what your results mean. Questions to ask your health care provider Ask your health care provider, or the department that is doing the test: When will my results be ready? How will I get my results? What are my treatment options? What  other tests do I need? What are my next steps? Summary The A1C test may be done to evaluate your risk for developing diabetes, to diagnose diabetes, and to monitor long-term control of blood sugar (glucose) in people who have diabetes and help make treatment decisions. Hemoglobin is a type of protein in the blood that carries oxygen. Glucose attaches to hemoglobin to form glycated hemoglobin. This test checks the amount of glycated hemoglobin in your blood, which is a good indicator of the average amount of glucose in your blood during the past 2-3 months. Talk with your health care provider about what your results mean. This information is not intended to replace advice given to you by your health care provider. Make sure you discuss any questions you have with your health care provider. Document Revised: 08/02/2020 Document Reviewed: 08/02/2020 Elsevier Patient Education  2022 [employer].

## 2022-06-04 NOTE — Progress Notes (Signed)
Subjective:    Patient ID: Cory Houston, male    DOB: 1956-06-22, 66 y.o.   MRN: [id number]  HPI  66 yo male in non acute distress, here for annual physical exam. No complaints today. Did let me know he was out of his Fenofibrate x 7 days before his blood work. Also ate pancakes and hot dogs the night before fasting labs. Discussed referral to Endocrine to get tighter control of glucose/A1C. Would also like Kenalog refill.   Review of Systems  All other systems reviewed and are negative.      Objective:   Physical Exam Vitals and nursing note reviewed.  Constitutional:      Appearance: Normal appearance. He is well-developed and well-groomed.  HENT:     Head: Normocephalic and atraumatic.     Jaw: There is normal jaw occlusion.     Right Ear: Hearing, ear canal and external ear normal. There is impacted cerumen.     Left Ear: Hearing, ear canal and external ear normal. There is impacted cerumen.     Nose: Nose normal. No congestion or rhinorrhea.     Mouth/Throat:     Mouth: Mucous membranes are moist.     Pharynx: Oropharynx is clear.  Eyes:     General: Lids are normal. Vision grossly intact. Gaze aligned appropriately.        Right eye: No discharge.        Left eye: No discharge.     Extraocular Movements: Extraocular movements intact.     Conjunctiva/sclera: Conjunctivae normal.     Pupils: Pupils are equal, round, and reactive to light.  Neck:     Vascular: No JVD.     Trachea: Trachea normal.  Cardiovascular:     Rate and Rhythm: Normal rate and regular rhythm.     Pulses:          Femoral pulses are 2+ on the right side and 2+ on the left side.      Popliteal pulses are 2+ on the right side and 2+ on the left side.       Dorsalis pedis pulses are 2+ on the right side and 2+ on the left side.       Posterior tibial pulses are 2+ on the right side and 2+ on the left side.     Heart sounds: Normal heart sounds. Heart sounds not distant. No murmur heard.    No  systolic murmur is present.     No diastolic murmur is present.     No friction rub. No gallop. No S4 sounds.  Pulmonary:     Effort: Pulmonary effort is normal.     Breath sounds: Normal breath sounds.  Abdominal:     General: Abdomen is flat. Bowel sounds are normal.     Palpations: Abdomen is soft.  Musculoskeletal:        General: Normal range of motion.     Cervical back: Normal range of motion and neck supple.     Right lower leg: No edema.     Left lower leg: No edema.  Feet:     Right foot:     Skin integrity: Skin integrity normal.     Toenail Condition: Right toenails are abnormally thick.     Left foot:     Skin integrity: Skin integrity normal.     Toenail Condition: Left toenails are abnormally thick.  Lymphadenopathy:     Head:     Right side of head:  No submental, submandibular, tonsillar, preauricular or posterior auricular adenopathy.     Left side of head: No submental, submandibular, tonsillar, preauricular, posterior auricular or occipital adenopathy.     Cervical: No cervical adenopathy.     Right cervical: No superficial, deep or posterior cervical adenopathy.    Left cervical: No superficial, deep or posterior cervical adenopathy.     Upper Body:     Right upper body: No supraclavicular adenopathy.     Left upper body: No supraclavicular adenopathy.     Lower Body: No right inguinal adenopathy. No left inguinal adenopathy.  Skin:    General: Skin is warm and dry.     Capillary Refill: Capillary refill takes less than 2 seconds.  Neurological:     General: No focal deficit present.     Mental Status: He is alert and oriented to person, place, and time. Mental status is at baseline.  Psychiatric:        Attention and Perception: Attention and perception normal.        Mood and Affect: Mood and affect normal.        Speech: Speech normal.        Behavior: Behavior normal. Behavior is cooperative.        Thought Content: Thought content normal.         Cognition and Memory: Cognition and memory normal.        Judgment: Judgment normal.     Comments: Gait wnl     Recent Results (from the past 2160 hour(s))  Lipid panel     Status: Abnormal   Collection Time: 03/11/22  9:10 AM  Result Value Ref Range   Cholesterol, Total 140 100 - 199 mg/dL   Triglycerides CANCELED mg/dL    Comment: Specimen quantity insufficient for verification by repeat analysis.  Result canceled by the ancillary.    HDL 17 (L) >39 mg/dL   VLDL Cholesterol Cal CANCELED mg/dL    Comment: Unable to calculate result since non-numeric result obtained for component test.  Result canceled by the ancillary.    LDL Chol Calc (NIH) CANCELED mg/dL    Comment: Unable to calculate result since non-numeric result obtained for component test.  Result canceled by the ancillary.    Chol/HDL Ratio 8.2 (H) 0.0 - 5.0 ratio    Comment:                                   T. Chol/HDL Ratio                                             Men  Women                               1/2 Avg.Risk  3.4    3.3                                   Avg.Risk  5.0    4.4                                2X Avg.Risk  9.6  7.1                                3X Avg.Risk 23.4   11.0   VITAMIN D 25 Hydroxy (Vit-D Deficiency, Fractures)     Status: Abnormal   Collection Time: 05/29/22  8:08 AM  Result Value Ref Range   Vit D, 25-Hydroxy 25.2 (L) 30.0 - 100.0 ng/mL    Comment: Vitamin D deficiency has been defined by the [place] practice guideline as a level of serum 25-OH vitamin D less than 20 ng/mL (1,2). The Endocrine Society went on to further define vitamin D insufficiency as a level between 21 and 29 ng/mL (2). 1. IOM (Institute of Medicine). 2010. Dietary reference    intakes for calcium and D. [place]: The    [employer]. 2. Holick MF, Binkley Cameron, Bischoff-Ferrari HA, et al.    Evaluation, treatment, and prevention of vitamin D     deficiency: an Endocrine Society clinical practice    guideline. JCEM. 2011 Jul; 96(7):1911-30.   Hgb A1c w/o eAG     Status: Abnormal   Collection Time: 05/29/22  8:08 AM  Result Value Ref Range   Hgb A1c MFr Bld 7.8 (H) 4.8 - 5.6 %    Comment:          Prediabetes: 5.7 - 6.4          Diabetes: >6.4          Glycemic control for adults with diabetes: <7.0   CMP12+LP+TP+TSH+6AC+PSA+CBC.     Status: Abnormal   Collection Time: 05/29/22  8:08 AM  Result Value Ref Range   Glucose 175 (H) 70 - 99 mg/dL   Uric Acid 7.0 3.8 - 8.4 mg/dL    Comment:            Therapeutic target for gout patients: <6.0   BUN 17 8 - 27 mg/dL   Creatinine, Ser 0.95 0.76 - 1.27 mg/dL   eGFR 90 >59 mL/min/1.73   BUN/Creatinine Ratio 18 10 - 24   Sodium 136 134 - 144 mmol/L   Potassium 4.4 3.5 - 5.2 mmol/L   Chloride 102 96 - 106 mmol/L   Calcium 8.9 8.6 - 10.2 mg/dL   Phosphorus 2.9 2.8 - 4.1 mg/dL   Total Protein 7.3 6.0 - 8.5 g/dL   Albumin 4.5 3.9 - 4.9 g/dL    Comment:               **Please note reference interval change**   Globulin, Total 2.8 1.5 - 4.5 g/dL   Albumin/Globulin Ratio 1.6 1.2 - 2.2   Bilirubin Total 0.5 0.0 - 1.2 mg/dL   Alkaline Phosphatase 141 (H) 44 - 121 IU/L   LDH 267 (H) 121 - 224 IU/L   AST 40 0 - 40 IU/L   ALT 59 (H) 0 - 44 IU/L   GGT 38 0 - 65 IU/L   Iron 87 38 - 169 ug/dL   Cholesterol, Total 144 100 - 199 mg/dL   Triglycerides 974 (HH) 0 - 149 mg/dL    Comment: Results confirmed on dilution.    HDL 19 (L) >39 mg/dL   VLDL Cholesterol Cal CANCELED mg/dL    Comment: Unable to calculate result since non-numeric result obtained for component test.  Result canceled by the ancillary.    LDL Chol Calc (NIH) Comment (A) 0 - 99 mg/dL  Comment: Triglyceride result indicated is too high for an accurate LDL cholesterol estimation.    Chol/HDL Ratio 7.6 (H) 0.0 - 5.0 ratio    Comment:                                   T. Chol/HDL Ratio                                              Men  Women                               1/2 Avg.Risk  3.4    3.3                                   Avg.Risk  5.0    4.4                                2X Avg.Risk  9.6    7.1                                3X Avg.Risk 23.4   11.0    Estimated CHD Risk 1.6 (H) 0.0 - 1.0 times avg.    Comment: The CHD Risk is based on the T. Chol/HDL ratio. Other factors affect CHD Risk such as hypertension, smoking, diabetes, severe obesity, and family history of premature CHD.    TSH 2.120 0.450 - 4.500 uIU/mL   T4, Total 7.5 4.5 - 12.0 ug/dL   T3 Uptake Ratio 33 24 - 39 %   Free Thyroxine Index 2.5 1.2 - 4.9   Prostate Specific Ag, Serum 0.2 0.0 - 4.0 ng/mL    Comment: Roche ECLIA methodology. According to the American Urological Association, Serum PSA should decrease and remain at undetectable levels after radical prostatectomy. The AUA defines biochemical recurrence as an initial PSA value 0.2 ng/mL or greater followed by a subsequent confirmatory PSA value 0.2 ng/mL or greater. Values obtained with different assay methods or kits cannot be used interchangeably. Results cannot be interpreted as absolute evidence of the presence or absence of malignant disease.    WBC 6.1 3.4 - 10.8 x10E3/uL   RBC 5.66 4.14 - 5.80 x10E6/uL   Hemoglobin 17.2 13.0 - 17.7 g/dL   Hematocrit 49.9 37.5 - 51.0 %   MCV 88 79 - 97 fL   MCH 30.4 26.6 - 33.0 pg   MCHC 34.5 31.5 - 35.7 g/dL   RDW 14.2 11.6 - 15.4 %   Platelets 211 150 - 450 x10E3/uL   Neutrophils 61 Not Estab. %   Lymphs 28 Not Estab. %   Monocytes 9 Not Estab. %   Eos 2 Not Estab. %   Basos 0 Not Estab. %   Neutrophils Absolute 3.7 1.4 - 7.0 x10E3/uL   Lymphocytes Absolute 1.7 0.7 - 3.1 x10E3/uL   Monocytes Absolute 0.6 0.1 - 0.9 x10E3/uL   EOS (ABSOLUTE) 0.1 0.0 - 0.4 x10E3/uL   Basophils Absolute 0.0 0.0 - 0.2 x10E3/uL   Immature Granulocytes 0 Not Estab. %  Immature Grans (Abs) 0.0 0.0 - 0.1 x10E3/uL  Lipid panel     Status:  Abnormal   Collection Time: 05/29/22  8:09 AM  Result Value Ref Range   Cholesterol, Total 142 100 - 199 mg/dL   Triglycerides 989 (HH) 0 - 149 mg/dL    Comment: Results confirmed on dilution.    HDL 17 (L) >39 mg/dL   VLDL Cholesterol Cal Comment (A) 5 - 40 mg/dL    Comment: The calculation for the VLDL cholesterol is not valid when triglyceride level is >800 mg/dL.    LDL Chol Calc (NIH) Comment (A) 0 - 99 mg/dL    Comment: Triglyceride result indicated is too high for an accurate LDL cholesterol estimation.    Chol/HDL Ratio 8.4 (H) 0.0 - 5.0 ratio    Comment:                                   T. Chol/HDL Ratio                                             Men  Women                               1/2 Avg.Risk  3.4    3.3                                   Avg.Risk  5.0    4.4                                2X Avg.Risk  9.6    7.1                                3X Avg.Risk 23.4   11.0   POCT urinalysis dipstick     Status: Abnormal   Collection Time: 05/29/22  9:15 AM  Result Value Ref Range   Color, UA Yellow    Clarity, UA Clear    Glucose, UA Positive (A) Negative   Bilirubin, UA Negative    Ketones, UA Small    Spec Grav, UA 1.015 1.010 - 1.025   Blood, UA Negative    pH, UA 5.0 5.0 - 8.0   Protein, UA Negative Negative   Urobilinogen, UA 0.2 0.2 or 1.0 E.U./dL   Nitrite, UA Negative    Leukocytes, UA Negative Negative   Appearance     Odor           Assessment & Plan:  Annual exam and review of labs Fasting glucose elevated to  175 HyperTriglyceridemia 989 referred by his Cardiologist to a special lipid Cardiology management. His Cardiologist also started patient on Vascepa. Glucosuria and Ketonuria. A1C increased to  7.8  Patient interested in possibly getting Continuous Glucose Monitoring will refer to Endocrine for better glucose management and possibly CGM. Colonoscopy referral already made. Informed patient I am leaving the practice. It has been a  pleasure having him as a patient.He verbalizes understanding and has no questions at discharge.

## 2022-06-17 ENCOUNTER — Ambulatory Visit: Payer: BC Managed Care – PPO | Admitting: Cardiology

## 2022-06-18 ENCOUNTER — Ambulatory Visit: Payer: BC Managed Care – PPO | Admitting: Cardiology

## 2022-06-18 ENCOUNTER — Encounter: Payer: Self-pay | Admitting: Cardiology

## 2022-06-18 VITALS — BP 116/80 | HR 71 | Ht 71.0 in | Wt 226.0 lb

## 2022-06-18 DIAGNOSIS — I10 Essential (primary) hypertension: Secondary | ICD-10-CM

## 2022-06-18 DIAGNOSIS — E781 Pure hyperglyceridemia: Secondary | ICD-10-CM | POA: Diagnosis not present

## 2022-06-18 DIAGNOSIS — I493 Ventricular premature depolarization: Secondary | ICD-10-CM | POA: Diagnosis not present

## 2022-06-18 MED ORDER — ICOSAPENT ETHYL 1 G PO CAPS: 2.0000 g | ORAL_CAPSULE | Freq: Two times a day (BID) | ORAL | 2 refills | Status: DC

## 2022-06-18 NOTE — Progress Notes (Signed)
Cardiology Office Note:    Date:  06/18/2022   ID:  Cory Houston, DOB 1956-10-24, MRN [id number]  PCP:  Kathyrn Lass   [facility]  Cardiologist:  [name], MD  Advanced Practice Provider:  No care team member to display Electrophysiologist:  None       Referring MD: Marcy Salvo*   Chief Complaint  Patient presents with   Follow-up    3 month F/U-Patient would like to discuss concerns about elevated triglyceride levels.    History of Present Illness:    Cory Houston is a 66 y.o. male with a hx of hypertension, hypertriglyceridemia, PVCs, diabetes presents for follow-up.   Being seen for frequent PVCs and hypertriglyceridemia.  Recent cholesterol check showed elevated triglycerides, Crestor was started, states taking Crestor as prescribed.  Compliant with fenofibrate, Vascepa, Zetia.  Referral was placed to lipid clinic.  Denies chest pain or shortness of breath, tolerating all medications without any adverse effects.  Prior notes Cardiac monitor 02/2021 frequent PVCs, 2.3% PVC burden.   Past Medical History:  Diagnosis Date   Allergy    Arthritis    Diabetes mellitus without complication (HCC)    GERD (gastroesophageal reflux disease)    Hyperlipidemia    Hypertension    Sleep apnea     Past Surgical History:  Procedure Laterality Date   VASECTOMY      Current Medications: Current Meds  Medication Sig   amLODipine (NORVASC) 10 MG tablet TAKE 1 TABLET BY MOUTH EVERY DAY   Cholecalciferol (VITAMIN D-3) 5000 units TABS Take 1 tablet by mouth every other day.    ezetimibe (ZETIA) 10 MG tablet Take 1 tablet (10 mg total) by mouth daily.   fenofibrate (TRICOR) 145 MG tablet Take 1 tablet (145 mg total) by mouth daily.   JARDIANCE 10 MG TABS tablet TAKE 1 TABLET BY MOUTH EVERY DAY BEFORE BREAKFAST   loratadine (CLARITIN) 10 MG tablet TAKE 1 TABLET BY MOUTH EVERY DAY   metFORMIN (GLUCOPHAGE-XR) 500 MG 24 hr tablet TAKE 2 TABLETS  BY MOUTH TWICE A DAY   metoprolol succinate (TOPROL XL) 25 MG 24 hr tablet Take 1 tablet (25 mg total) by mouth daily.   Multiple Vitamin (MULTIVITAMIN) tablet Take 1 tablet by mouth daily.   omeprazole (PRILOSEC) 20 MG capsule TAKE 1 CAPSULE BY MOUTH EVERY DAY   ramipril (ALTACE) 5 MG capsule TAKE 1 CAPSULE BY MOUTH DAILY PLEASE CALL OFFICE TO SCHEDULE LABS NOW & BLOOD PRESSURE RECHECK AFTER   rosuvastatin (CRESTOR) 40 MG tablet Take 1 tablet (40 mg total) by mouth daily.   triamcinolone cream (KENALOG) 0.1 % Apply 1 Application topically 2 (two) times daily. To affected area.   [DISCONTINUED] icosapent Ethyl (VASCEPA) 1 g capsule Take 2 capsules (2 g total) by mouth 2 (two) times daily.     Allergies:   Codeine   Social History   Socioeconomic History   Marital status: Married    Spouse name: Not on file   Number of children: Not on file   Years of education: Not on file   Highest education level: Not on file  Occupational History   Not on file  Tobacco Use   Smoking status: Never   Smokeless tobacco: Never  Vaping Use   Vaping Use: Never used  Substance and Sexual Activity   Alcohol use: Yes    Comment: rarely 1 per month   Drug use: No   Sexual activity: Yes  Other Topics  Concern   Not on file  Social History Narrative   Not on file   Social Determinants of Health   Financial Resource Strain: Not on file  Food Insecurity: Not on file  Transportation Needs: Not on file  Physical Activity: Not on file  Stress: Not on file  Social Connections: Not on file     Family History: The patient's family history includes Cataracts in his sister; Congestive Heart Failure in his father and mother; Diabetes in his father and mother; Diverticulitis in his father; Heart attack in his maternal grandmother; Heart disease in his mother; Heart murmur in his son; Hypercholesterolemia in his father and mother; Hypertension in his brother, father, mother, paternal aunt, and paternal  uncle; Lung cancer in his maternal grandmother; Obesity in his mother; Stroke in his paternal grandfather and paternal grandmother.  ROS:   Please see the history of present illness.     All other systems reviewed and are negative.  EKGs/Labs/Other Studies Reviewed:    The following studies were reviewed today:   EKG:  EKG is ordered today.  EKG shows normal sinus rhythm, normal ECG  Recent Labs: 05/29/2022: ALT 59; BUN 17; Creatinine, Ser 0.95; Hemoglobin 17.2; Platelets 211; Potassium 4.4; Sodium 136; TSH 2.120  Recent Lipid Panel    Component Value Date/Time   CHOL 142 05/29/2022 0809   TRIG 989 (HH) 05/29/2022 0809   HDL 17 (L) 05/29/2022 0809   CHOLHDL 8.4 (H) 05/29/2022 0809   CHOLHDL 6.2 08/15/2021 0823   VLDL NOT CALCULATED 08/15/2021 0823   LDLCALC Comment (A) 05/29/2022 0809   LDLDIRECT 65.7 08/15/2021 0823     Risk Assessment/Calculations:      Physical Exam:    VS:  BP 116/80 (BP Location: Left Arm, Patient Position: Sitting, Cuff Size: Large)   Pulse 71   Ht '5\' 11"'$  (1.803 m)   Wt 226 lb (102.5 kg)   SpO2 98%   BMI 31.52 kg/m     Wt Readings from Last 3 Encounters:  06/18/22 226 lb (102.5 kg)  06/04/22 226 lb 3.2 oz (102.6 kg)  05/15/22 226 lb (102.5 kg)     GEN:  Well nourished, well developed in no acute distress HEENT: Normal NECK: No JVD; No carotid bruits CARDIAC: RRR, no murmurs, rubs, gallops RESPIRATORY:  Clear to auscultation without rales, wheezing or rhonchi  ABDOMEN: Soft, non-tender, non-distended MUSCULOSKELETAL:  No edema; No deformity  SKIN: Warm and dry NEUROLOGIC:  Alert and oriented x 3 PSYCHIATRIC:  Normal affect   ASSESSMENT:    1. PVC's (premature ventricular contractions)   2. Pure hypertriglyceridemia   3. Primary hypertension    PLAN:    In order of problems listed above:  Frequent PVCs, palpitations, resolved with Toprol, continue Toprol-XL 25 mg daily. hypertriglyceridemia.  Triglycerides still elevated,  start Crestor 40 mg daily,  Continue Zetia 10 mg daily, Vascepa,  fenofibrate.  Repeat lipid panel in 3 months, keep appointment with lipid clinic. Hypertension, BP controlled.  Continue Norvasc 10 and ramipril '5mg'$ .  Follow-up in 5-6 months  Medication Adjustments/Labs and Tests Ordered: Current medicines are reviewed at length with the patient today.  Concerns regarding medicines are outlined above.  Orders Placed This Encounter  Procedures   Lipid panel    Meds ordered this encounter  Medications   icosapent Ethyl (VASCEPA) 1 g capsule    Sig: Take 2 capsules (2 g total) by mouth 2 (two) times daily.    Dispense:  120 capsule  Refill:  2     Patient Instructions  Medication Instructions:   Your physician recommends that you continue on your current medications as directed. Please refer to the Current Medication list given to you today.   *If you need a refill on your cardiac medications before your next appointment, please call your pharmacy*   Lab Work:  Your physician recommends that you return for a FASTING lipid profile:  IN 3 MONTHS  - You will need to be fasting. Please do not have anything to eat or drink after midnight the morning you have the lab work. You may only have water or black coffee with no cream or sugar.   - Please go to the [facility]. You will check in at the front desk to the right as you walk into the atrium. Valet Parking is offered if needed. - No appointment needed. You may go any day between 7 am and 6 pm.     Follow-Up: At [facility], you and your health needs are our priority.  As part of our continuing mission to provide you with exceptional heart care, we have created designated Provider Care Teams.  These Care Teams include your primary Cardiologist (physician) and Advanced Practice Providers (APPs -  Physician Assistants and Nurse Practitioners) who all work together to provide you with the care you need, when you need  it.  We recommend signing up for the patient portal called "MyChart".  Sign up information is provided on this After Visit Summary.  MyChart is used to connect with patients for Virtual Visits (Telemedicine).  Patients are able to view lab/test results, encounter notes, upcoming appointments, etc.  Non-urgent messages can be sent to your provider as well.   To learn more about what you can do with MyChart, go to [email or web address].    Your next appointment:   5 month(s)  The format for your next appointment:   In Person  Provider:   [name], MD    Other Instructions   Important Information About Sugar         Signed, [name], MD  06/18/2022 9:18 AM    [place]

## 2022-06-18 NOTE — Patient Instructions (Signed)
Medication Instructions:   Your physician recommends that you continue on your current medications as directed. Please refer to the Current Medication list given to you today.   *If you need a refill on your cardiac medications before your next appointment, please call your pharmacy*   Lab Work:  Your physician recommends that you return for a FASTING lipid profile:  IN 3 MONTHS  - You will need to be fasting. Please do not have anything to eat or drink after midnight the morning you have the lab work. You may only have water or black coffee with no cream or sugar.   - Please go to the [facility]. You will check in at the front desk to the right as you walk into the atrium. Valet Parking is offered if needed. - No appointment needed. You may go any day between 7 am and 6 pm.     Follow-Up: At [facility], you and your health needs are our priority.  As part of our continuing mission to provide you with exceptional heart care, we have created designated Provider Care Teams.  These Care Teams include your primary Cardiologist (physician) and Advanced Practice Providers (APPs -  Physician Assistants and Nurse Practitioners) who all work together to provide you with the care you need, when you need it.  We recommend signing up for the patient portal called "MyChart".  Sign up information is provided on this After Visit Summary.  MyChart is used to connect with patients for Virtual Visits (Telemedicine).  Patients are able to view lab/test results, encounter notes, upcoming appointments, etc.  Non-urgent messages can be sent to your provider as well.   To learn more about what you can do with MyChart, go to [email or web address].    Your next appointment:   5 month(s)  The format for your next appointment:   In Person  Provider:   [name], MD    Other Instructions   Important Information About Sugar

## 2022-06-20 ENCOUNTER — Encounter: Payer: Self-pay | Admitting: Nurse Practitioner

## 2022-06-20 NOTE — Addendum Note (Signed)
Addended by: Raelene Bott, Briceyda Abdullah L on: 06/20/2022 08:36 AM   Modules accepted: Orders

## 2022-06-28 ENCOUNTER — Other Ambulatory Visit: Payer: Self-pay

## 2022-06-28 MED ORDER — FENOFIBRATE 145 MG PO TABS: 145.0000 mg | ORAL_TABLET | Freq: Every day | ORAL | 5 refills | Status: DC

## 2022-07-31 ENCOUNTER — Other Ambulatory Visit: Payer: Self-pay | Admitting: *Deleted

## 2022-07-31 MED ORDER — ICOSAPENT ETHYL 1 G PO CAPS: 2.0000 g | ORAL_CAPSULE | Freq: Two times a day (BID) | ORAL | 2 refills | Status: DC

## 2022-08-06 ENCOUNTER — Telehealth: Payer: Self-pay

## 2022-08-06 NOTE — Telephone Encounter (Signed)
Prior authorization initiated by [email or web address] for Vascepa 1 gm capsules  KEY: [id number]  Response: Prior authorization for Vascepa has been approved. Effective from 08/06/2022 through 08/05/2023.

## 2022-08-07 ENCOUNTER — Encounter: Payer: Self-pay | Admitting: Nurse Practitioner

## 2022-08-07 ENCOUNTER — Ambulatory Visit (INDEPENDENT_AMBULATORY_CARE_PROVIDER_SITE_OTHER): Payer: Self-pay | Admitting: Nurse Practitioner

## 2022-08-07 VITALS — Wt 224.6 lb

## 2022-08-07 DIAGNOSIS — J069 Acute upper respiratory infection, unspecified: Secondary | ICD-10-CM

## 2022-08-07 LAB — POC COVID19 BINAXNOW: SARS Coronavirus 2 Ag: NEGATIVE

## 2022-08-07 LAB — POCT INFLUENZA A/B
Influenza A, POC: NEGATIVE
Influenza B, POC: NEGATIVE

## 2022-08-07 NOTE — Patient Instructions (Signed)
  New Patient Visit with Romilda Garret Friday October 18, 2022 Arrive by 9:25 AM EST Starts at 9:40 AM EST (40 minutes) Add to calendar [employer] at [facility] [address] [place] [address] [phone]

## 2022-08-07 NOTE — Progress Notes (Signed)
[facility] [address], [place] [address] [phone]  Office Visit Note  Patient Name: Cory Houston Date of Birth [id number]  Medical Record number [id number]  Date of Service: 08/07/2022  Chief Complaint  Patient presents with   Sinusitis   Headache    Pt stated he started feeling bad Monday. Head cold, headache. Tue he had a fever and throat hurts. Feeling better today.     HPI 66 year old male presenting to [facility] with complaints of body aches sinus congestion and PND for the past 3 days. Yesterday symptoms were worse, slightly improved today.     Current Medication:  Outpatient Encounter Medications as of 08/07/2022  Medication Sig   amLODipine (NORVASC) 10 MG tablet TAKE 1 TABLET BY MOUTH EVERY DAY   Cholecalciferol (VITAMIN D-3) 5000 units TABS Take 1 tablet by mouth every other day.    ezetimibe (ZETIA) 10 MG tablet Take 1 tablet (10 mg total) by mouth daily.   fenofibrate (TRICOR) 145 MG tablet Take 1 tablet (145 mg total) by mouth daily.   icosapent Ethyl (VASCEPA) 1 g capsule Take 2 capsules (2 g total) by mouth 2 (two) times daily.   JARDIANCE 10 MG TABS tablet TAKE 1 TABLET BY MOUTH EVERY DAY BEFORE BREAKFAST   loratadine (CLARITIN) 10 MG tablet TAKE 1 TABLET BY MOUTH EVERY DAY   metFORMIN (GLUCOPHAGE-XR) 500 MG 24 hr tablet TAKE 2 TABLETS BY MOUTH TWICE A DAY   metoprolol succinate (TOPROL XL) 25 MG 24 hr tablet Take 1 tablet (25 mg total) by mouth daily.   Multiple Vitamin (MULTIVITAMIN) tablet Take 1 tablet by mouth daily.   omeprazole (PRILOSEC) 20 MG capsule TAKE 1 CAPSULE BY MOUTH EVERY DAY   ramipril (ALTACE) 5 MG capsule TAKE 1 CAPSULE BY MOUTH DAILY PLEASE CALL OFFICE TO SCHEDULE LABS NOW & BLOOD PRESSURE RECHECK AFTER   rosuvastatin (CRESTOR) 40 MG tablet Take 1 tablet (40 mg total) by mouth daily.   triamcinolone cream (KENALOG) 0.1 % Apply 1 Application topically 2 (two) times daily. To affected area.   No  facility-administered encounter medications on file as of 08/07/2022.      Medical History: Past Medical History:  Diagnosis Date   Allergy    Arthritis    Diabetes mellitus without complication (HCC)    GERD (gastroesophageal reflux disease)    Hyperlipidemia    Hypertension    Sleep apnea      Vital Signs: Wt 224 lb 9.6 oz (101.9 kg)   BMI 31.33 kg/m    Review of Systems  Constitutional:  Positive for fatigue.  HENT:  Positive for congestion, rhinorrhea and sore throat.   Eyes: Negative.   Respiratory: Negative.    Cardiovascular: Negative.   Musculoskeletal:  Positive for myalgias.  Neurological: Negative.     Physical Exam HENT:     Head: Normocephalic.     Mouth/Throat:     Mouth: Mucous membranes are moist.  Eyes:     Extraocular Movements: Extraocular movements intact.  Cardiovascular:     Rate and Rhythm: Normal rate and regular rhythm.     Heart sounds: Normal heart sounds.  Pulmonary:     Effort: Pulmonary effort is normal.  Musculoskeletal:     Cervical back: Normal range of motion.  Skin:    General: Skin is warm.  Neurological:     Mental Status: He is alert.  Psychiatric:        Mood and Affect: Mood  normal.    Recent Results (from the past 2160 hour(s))  VITAMIN D 25 Hydroxy (Vit-D Deficiency, Fractures)     Status: Abnormal   Collection Time: 05/29/22  8:08 AM  Result Value Ref Range   Vit D, 25-Hydroxy 25.2 (L) 30.0 - 100.0 ng/mL    Comment: Vitamin D deficiency has been defined by the [place] practice guideline as a level of serum 25-OH vitamin D less than 20 ng/mL (1,2). The Endocrine Society went on to further define vitamin D insufficiency as a level between 21 and 29 ng/mL (2). 1. IOM (Institute of Medicine). 2010. Dietary reference    intakes for calcium and D. [place]: The    [employer]. 2. Holick MF, Binkley [place], Bischoff-Ferrari HA, et al.    Evaluation, treatment,  and prevention of vitamin D    deficiency: an Endocrine Society clinical practice    guideline. JCEM. 2011 Jul; 96(7):1911-30.   Hgb A1c w/o eAG     Status: Abnormal   Collection Time: 05/29/22  8:08 AM  Result Value Ref Range   Hgb A1c MFr Bld 7.8 (H) 4.8 - 5.6 %    Comment:          Prediabetes: 5.7 - 6.4          Diabetes: >6.4          Glycemic control for adults with diabetes: <7.0   CMP12+LP+TP+TSH+6AC+PSA+CBC.     Status: Abnormal   Collection Time: 05/29/22  8:08 AM  Result Value Ref Range   Glucose 175 (H) 70 - 99 mg/dL   Uric Acid 7.0 3.8 - 8.4 mg/dL    Comment:            Therapeutic target for gout patients: <6.0   BUN 17 8 - 27 mg/dL   Creatinine, Ser 0.95 0.76 - 1.27 mg/dL   eGFR 90 >59 mL/min/1.73   BUN/Creatinine Ratio 18 10 - 24   Sodium 136 134 - 144 mmol/L   Potassium 4.4 3.5 - 5.2 mmol/L   Chloride 102 96 - 106 mmol/L   Calcium 8.9 8.6 - 10.2 mg/dL   Phosphorus 2.9 2.8 - 4.1 mg/dL   Total Protein 7.3 6.0 - 8.5 g/dL   Albumin 4.5 3.9 - 4.9 g/dL    Comment:               **Please note reference interval change**   Globulin, Total 2.8 1.5 - 4.5 g/dL   Albumin/Globulin Ratio 1.6 1.2 - 2.2   Bilirubin Total 0.5 0.0 - 1.2 mg/dL   Alkaline Phosphatase 141 (H) 44 - 121 IU/L   LDH 267 (H) 121 - 224 IU/L   AST 40 0 - 40 IU/L   ALT 59 (H) 0 - 44 IU/L   GGT 38 0 - 65 IU/L   Iron 87 38 - 169 ug/dL   Cholesterol, Total 144 100 - 199 mg/dL   Triglycerides 974 (HH) 0 - 149 mg/dL    Comment: Results confirmed on dilution.    HDL 19 (L) >39 mg/dL   VLDL Cholesterol Cal CANCELED mg/dL    Comment: Unable to calculate result since non-numeric result obtained for component test.  Result canceled by the ancillary.    LDL Chol Calc (NIH) Comment (A) 0 - 99 mg/dL    Comment: Triglyceride result indicated is too high for an accurate LDL cholesterol estimation.    Chol/HDL Ratio 7.6 (H) 0.0 - 5.0 ratio  Comment:                                   T. Chol/HDL Ratio                                              Men  Women                               1/2 Avg.Risk  3.4    3.3                                   Avg.Risk  5.0    4.4                                2X Avg.Risk  9.6    7.1                                3X Avg.Risk 23.4   11.0    Estimated CHD Risk 1.6 (H) 0.0 - 1.0 times avg.    Comment: The CHD Risk is based on the T. Chol/HDL ratio. Other factors affect CHD Risk such as hypertension, smoking, diabetes, severe obesity, and family history of premature CHD.    TSH 2.120 0.450 - 4.500 uIU/mL   T4, Total 7.5 4.5 - 12.0 ug/dL   T3 Uptake Ratio 33 24 - 39 %   Free Thyroxine Index 2.5 1.2 - 4.9   Prostate Specific Ag, Serum 0.2 0.0 - 4.0 ng/mL    Comment: Roche ECLIA methodology. According to the American Urological Association, Serum PSA should decrease and remain at undetectable levels after radical prostatectomy. The AUA defines biochemical recurrence as an initial PSA value 0.2 ng/mL or greater followed by a subsequent confirmatory PSA value 0.2 ng/mL or greater. Values obtained with different assay methods or kits cannot be used interchangeably. Results cannot be interpreted as absolute evidence of the presence or absence of malignant disease.    WBC 6.1 3.4 - 10.8 x10E3/uL   RBC 5.66 4.14 - 5.80 x10E6/uL   Hemoglobin 17.2 13.0 - 17.7 g/dL   Hematocrit 49.9 37.5 - 51.0 %   MCV 88 79 - 97 fL   MCH 30.4 26.6 - 33.0 pg   MCHC 34.5 31.5 - 35.7 g/dL   RDW 14.2 11.6 - 15.4 %   Platelets 211 150 - 450 x10E3/uL   Neutrophils 61 Not Estab. %   Lymphs 28 Not Estab. %   Monocytes 9 Not Estab. %   Eos 2 Not Estab. %   Basos 0 Not Estab. %   Neutrophils Absolute 3.7 1.4 - 7.0 x10E3/uL   Lymphocytes Absolute 1.7 0.7 - 3.1 x10E3/uL   Monocytes Absolute 0.6 0.1 - 0.9 x10E3/uL   EOS (ABSOLUTE) 0.1 0.0 - 0.4 x10E3/uL   Basophils Absolute 0.0 0.0 - 0.2 x10E3/uL   Immature Granulocytes 0 Not Estab. %   Immature Grans (Abs) 0.0 0.0 - 0.1  x10E3/uL  Lipid panel     Status: Abnormal   Collection Time: 05/29/22  8:09 AM  Result Value Ref Range   Cholesterol, Total 142 100 - 199 mg/dL   Triglycerides 989 (HH) 0 - 149 mg/dL    Comment: Results confirmed on dilution.    HDL 17 (L) >39 mg/dL   VLDL Cholesterol Cal Comment (A) 5 - 40 mg/dL    Comment: The calculation for the VLDL cholesterol is not valid when triglyceride level is >800 mg/dL.    LDL Chol Calc (NIH) Comment (A) 0 - 99 mg/dL    Comment: Triglyceride result indicated is too high for an accurate LDL cholesterol estimation.    Chol/HDL Ratio 8.4 (H) 0.0 - 5.0 ratio    Comment:                                   T. Chol/HDL Ratio                                             Men  Women                               1/2 Avg.Risk  3.4    3.3                                   Avg.Risk  5.0    4.4                                2X Avg.Risk  9.6    7.1                                3X Avg.Risk 23.4   11.0   POCT urinalysis dipstick     Status: Abnormal   Collection Time: 05/29/22  9:15 AM  Result Value Ref Range   Color, UA Yellow    Clarity, UA Clear    Glucose, UA Positive (A) Negative   Bilirubin, UA Negative    Ketones, UA Small    Spec Grav, UA 1.015 1.010 - 1.025   Blood, UA Negative    pH, UA 5.0 5.0 - 8.0   Protein, UA Negative Negative   Urobilinogen, UA 0.2 0.2 or 1.0 E.U./dL   Nitrite, UA Negative    Leukocytes, UA Negative Negative   Appearance     Odor    POC COVID-19     Status: Normal   Collection Time: 08/07/22  9:01 AM  Result Value Ref Range   SARS Coronavirus 2 Ag Negative Negative  POCT Influenza A/B     Status: Normal   Collection Time: 08/07/22  9:02 AM  Result Value Ref Range   Influenza A, POC Negative Negative   Influenza B, POC Negative Negative      Assessment/Plan: 1. Viral upper respiratory tract infection Discussed using Mucinex OTC Push fluids  Rest  RTC if symptoms persist or with new concerns as discussed  -  POC COVID-19 negative  - POCT Influenza A/B negative    Also in need for new PCP  Has refills on medications for next three months Scheduled with new PCP @ [facility]   General Counseling: Benuel verbalizes understanding of the findings of todays visit and agrees with plan of treatment. I have discussed any further diagnostic evaluation that may be needed or ordered today. We also reviewed his medications today. he has been encouraged to call the office with any questions or concerns that should arise related to todays visit.   Orders Placed This Encounter  Procedures   POC COVID-19   POCT Influenza A/B    No orders of the defined types were placed in this encounter.   Time spent:10 [facility] Family Nurse Practitioner

## 2022-08-09 ENCOUNTER — Ambulatory Visit (INDEPENDENT_AMBULATORY_CARE_PROVIDER_SITE_OTHER): Payer: Self-pay | Admitting: Nurse Practitioner

## 2022-08-09 DIAGNOSIS — J40 Bronchitis, not specified as acute or chronic: Secondary | ICD-10-CM

## 2022-08-09 MED ORDER — BENZONATATE 100 MG PO CAPS: 100.0000 mg | ORAL_CAPSULE | Freq: Three times a day (TID) | ORAL | 0 refills | Status: DC | PRN

## 2022-08-09 MED ORDER — AZITHROMYCIN 250 MG PO TABS: ORAL_TABLET | ORAL | 0 refills | Status: AC

## 2022-08-09 NOTE — Progress Notes (Signed)
Virtual Visit Consent   [name], you are scheduled for a virtual visit with a [facility] provider today. Just as with appointments in the office, your consent must be obtained to participate.   I need to obtain your verbal consent now. Are you willing to proceed with your visit today? [name] has provided verbal consent on 08/09/2022 for a virtual visit (video or telephone). [name], FNP  Date: 08/09/2022 8:11 AM  Virtual Visit via Video Note   I, [name], connected with  Cory Houston  ([id number], 1956-06-09) on 08/09/22 at  8:30 AM EDT by telephone and verified that I am speaking with the correct person using two identifiers.  Location: Patient: Virtual Visit Location Patient: Home Provider: Virtual Visit Location Provider: Home Office   I discussed the limitations of evaluation and management by telemedicine and the availability of in person appointments. The patient expressed understanding and agreed to proceed.    History of Present Illness: Cory Houston is a 66 y.o. and is being seen today for follow up regarding ongoing URI symptoms and dry cough with fevers at night.  He did try benzonatate last night with success on decreased coughing.   He was in the office earlier this week and tested negative for COVID at that time   Problems:  Patient Active Problem List   Diagnosis Date Noted   Noncompliance with CPAP treatment 01/09/2018   Prediabetes 01/31/2017   Hypertension 01/31/2017    Allergies:  Allergies  Allergen Reactions   Codeine Anaphylaxis   Medications:  Current Outpatient Medications:    azithromycin (ZITHROMAX) 250 MG tablet, Take 2 tablets on day 1, then 1 tablet daily on days 2 through 5, Disp: 6 tablet, Rfl: 0   benzonatate (TESSALON) 100 MG capsule, Take 1 capsule (100 mg total) by mouth 3 (three) times daily as needed for cough., Disp: 30 capsule, Rfl: 0   amLODipine (NORVASC) 10 MG tablet, TAKE 1 TABLET BY MOUTH EVERY DAY, Disp: 90  tablet, Rfl: 1   Cholecalciferol (VITAMIN D-3) 5000 units TABS, Take 1 tablet by mouth every other day. , Disp: , Rfl:    ezetimibe (ZETIA) 10 MG tablet, Take 1 tablet (10 mg total) by mouth daily., Disp: 30 tablet, Rfl: 3   fenofibrate (TRICOR) 145 MG tablet, Take 1 tablet (145 mg total) by mouth daily., Disp: 30 tablet, Rfl: 5   icosapent Ethyl (VASCEPA) 1 g capsule, Take 2 capsules (2 g total) by mouth 2 (two) times daily., Disp: 120 capsule, Rfl: 2   JARDIANCE 10 MG TABS tablet, TAKE 1 TABLET BY MOUTH EVERY DAY BEFORE BREAKFAST, Disp: 90 tablet, Rfl: 1   loratadine (CLARITIN) 10 MG tablet, TAKE 1 TABLET BY MOUTH EVERY DAY, Disp: 90 tablet, Rfl: 3   metFORMIN (GLUCOPHAGE-XR) 500 MG 24 hr tablet, TAKE 2 TABLETS BY MOUTH TWICE A DAY, Disp: 360 tablet, Rfl: 1   metoprolol succinate (TOPROL XL) 25 MG 24 hr tablet, Take 1 tablet (25 mg total) by mouth daily., Disp: 30 tablet, Rfl: 3   Multiple Vitamin (MULTIVITAMIN) tablet, Take 1 tablet by mouth daily., Disp: , Rfl:    omeprazole (PRILOSEC) 20 MG capsule, TAKE 1 CAPSULE BY MOUTH EVERY DAY, Disp: 90 capsule, Rfl: 1   ramipril (ALTACE) 5 MG capsule, TAKE 1 CAPSULE BY MOUTH DAILY PLEASE CALL OFFICE TO SCHEDULE LABS NOW & BLOOD PRESSURE RECHECK AFTER, Disp: 90 capsule, Rfl: 0   rosuvastatin (CRESTOR) 40 MG tablet, Take 1 tablet (40 mg total) by mouth  daily., Disp: 30 tablet, Rfl: 3   triamcinolone cream (KENALOG) 0.1 %, Apply 1 Application topically 2 (two) times daily. To affected area., Disp: 30 g, Rfl: 0  Observations/Objective: No labored breathing.  Speech is clear and coherent with logical content.  Patient is alert and oriented at baseline.    Assessment and Plan: 1. Bronchitis - benzonatate (TESSALON) 100 MG capsule; Take 1 capsule (100 mg total) by mouth 3 (three) times daily as needed for cough.  Dispense: 30 capsule; Refill: 0 - azithromycin (ZITHROMAX) 250 MG tablet; Take 2 tablets on day 1, then 1 tablet daily on days 2 through 5   Dispense: 6 tablet; Refill: 0    Follow Up Instructions: I discussed the assessment and treatment plan with the patient. The patient was provided an opportunity to ask questions and all were answered. The patient agreed with the plan and demonstrated an understanding of the instructions.    The patient was advised to call back or seek an in-person evaluation if the symptoms worsen or if the condition fails to improve as anticipated.  Time:  I spent 5 minutes with the patient via telehealth technology discussing the above problems/concerns.    [name], FNP

## 2022-08-14 ENCOUNTER — Other Ambulatory Visit: Payer: Self-pay | Admitting: Nurse Practitioner

## 2022-08-14 DIAGNOSIS — I10 Essential (primary) hypertension: Secondary | ICD-10-CM

## 2022-08-26 ENCOUNTER — Other Ambulatory Visit: Payer: Self-pay

## 2022-08-26 MED ORDER — ROSUVASTATIN CALCIUM 40 MG PO TABS: 40.0000 mg | ORAL_TABLET | Freq: Every day | ORAL | 3 refills | Status: DC

## 2022-09-02 ENCOUNTER — Other Ambulatory Visit: Payer: Self-pay

## 2022-09-02 MED ORDER — EZETIMIBE 10 MG PO TABS: 10.0000 mg | ORAL_TABLET | Freq: Every day | ORAL | 2 refills | Status: DC

## 2022-09-04 ENCOUNTER — Encounter: Payer: Self-pay | Admitting: Nurse Practitioner

## 2022-09-04 ENCOUNTER — Ambulatory Visit (INDEPENDENT_AMBULATORY_CARE_PROVIDER_SITE_OTHER): Payer: Self-pay | Admitting: Nurse Practitioner

## 2022-09-04 VITALS — BP 120/80 | HR 69 | Temp 98.4°F

## 2022-09-04 DIAGNOSIS — J40 Bronchitis, not specified as acute or chronic: Secondary | ICD-10-CM

## 2022-09-04 MED ORDER — FAMOTIDINE 20 MG PO TABS: 20.0000 mg | ORAL_TABLET | Freq: Two times a day (BID) | ORAL | 0 refills | Status: DC

## 2022-09-04 MED ORDER — PREDNISONE 20 MG PO TABS: 20.0000 mg | ORAL_TABLET | Freq: Two times a day (BID) | ORAL | 0 refills | Status: AC

## 2022-09-04 NOTE — Progress Notes (Signed)
[facility] [address], [place] [address] [phone]  Office Visit Note  Patient Name: Cory Houston Date of Birth [id number]  Medical Record number [id number]  Date of Service: 09/04/2022    HPI 66 year old male presenting to [facility] with complaints of ongoing cough. He was initially seen 08/07/22 with viral URI symptoms, had a follow up later that week with worsening symptoms and was started on azithromycin and given benzonatate cough support. He was feeling better for a few days after his antibiotic. Then had to travel to [place] last week for a funeral. At the end of last week he had a day or two of body aches and fever followed by a return of nasal congestion and cough.   Denies a history of asthma or need for inhalers   He does suffer from GERD and stays on omeprazole daily for that  Notes the cough worsens while he eats     Current Medication:  Outpatient Encounter Medications as of 09/04/2022  Medication Sig   amLODipine (NORVASC) 10 MG tablet TAKE 1 TABLET BY MOUTH EVERY DAY   benzonatate (TESSALON) 100 MG capsule Take 1 capsule (100 mg total) by mouth 3 (three) times daily as needed for cough.   Cholecalciferol (VITAMIN D-3) 5000 units TABS Take 1 tablet by mouth every other day.    ezetimibe (ZETIA) 10 MG tablet Take 1 tablet (10 mg total) by mouth daily.   fenofibrate (TRICOR) 145 MG tablet Take 1 tablet (145 mg total) by mouth daily.   icosapent Ethyl (VASCEPA) 1 g capsule Take 2 capsules (2 g total) by mouth 2 (two) times daily.   JARDIANCE 10 MG TABS tablet TAKE 1 TABLET BY MOUTH EVERY DAY BEFORE BREAKFAST   loratadine (CLARITIN) 10 MG tablet TAKE 1 TABLET BY MOUTH EVERY DAY   metFORMIN (GLUCOPHAGE-XR) 500 MG 24 hr tablet TAKE 2 TABLETS BY MOUTH TWICE A DAY   metoprolol succinate (TOPROL XL) 25 MG 24 hr tablet Take 1 tablet (25 mg total) by mouth daily.   Multiple Vitamin (MULTIVITAMIN) tablet Take 1 tablet by mouth daily.    omeprazole (PRILOSEC) 20 MG capsule TAKE 1 CAPSULE BY MOUTH EVERY DAY   ramipril (ALTACE) 5 MG capsule TAKE 1 CAPSULE BY MOUTH DAILY PLEASE CALL OFFICE TO SCHEDULE LABS NOW & BLOOD PRESSURE RECHECK AFTER   rosuvastatin (CRESTOR) 40 MG tablet Take 1 tablet (40 mg total) by mouth daily.   triamcinolone cream (KENALOG) 0.1 % Apply 1 Application topically 2 (two) times daily. To affected area.   No facility-administered encounter medications on file as of 09/04/2022.      Medical History: Past Medical History:  Diagnosis Date   Allergy    Arthritis    Diabetes mellitus without complication (HCC)    GERD (gastroesophageal reflux disease)    Hyperlipidemia    Hypertension    Sleep apnea      Vital Signs: Today's Vitals   09/04/22 1532  BP: 120/80  Pulse: 69  Temp: 98.4 F (36.9 C)  TempSrc: Tympanic  SpO2: 98%   There is no height or weight on file to calculate BMI.   Review of Systems  Constitutional: Negative.   Respiratory:  Positive for cough.   Cardiovascular: Negative.   Gastrointestinal: Negative.   Endocrine: Negative.   Genitourinary: Negative.   Musculoskeletal: Negative.   Neurological: Negative.     Physical Exam HENT:     Head: Normocephalic.     Right Ear:  External ear normal.     Left Ear: External ear normal.     Nose: Nose normal.     Mouth/Throat:     Mouth: Mucous membranes are moist.     Pharynx: No oropharyngeal exudate or posterior oropharyngeal erythema.  Cardiovascular:     Rate and Rhythm: Regular rhythm.     Heart sounds: Normal heart sounds.  Pulmonary:     Effort: Pulmonary effort is normal.     Breath sounds: Normal breath sounds.  Skin:    General: Skin is warm.  Neurological:     Mental Status: He is alert and oriented to person, place, and time. Mental status is at baseline.  Psychiatric:        Mood and Affect: Mood normal.       Assessment/Plan: 1. Bronchitis  - famotidine (PEPCID) 20 MG tablet; Take 1 tablet (20  mg total) by mouth 2 (two) times daily for 7 days.  Dispense: 14 tablet; Refill: 0 - predniSONE (DELTASONE) 20 MG tablet; Take 1 tablet (20 mg total) by mouth 2 (two) times daily with a meal for 5 days.  Dispense: 10 tablet; Refill: 0    General Counseling: Sebastin verbalizes understanding of the findings of todays visit and agrees with plan of treatment. I have discussed any further diagnostic evaluation that may be needed or ordered today. We also reviewed his medications today. he has been encouraged to call the office with any questions or concerns that should arise related to todays visit.     Time spent:15 [facility] Family Nurse Practitioner

## 2022-09-05 ENCOUNTER — Other Ambulatory Visit: Payer: Self-pay

## 2022-09-05 MED ORDER — METOPROLOL SUCCINATE ER 25 MG PO TB24: 25.0000 mg | ORAL_TABLET | Freq: Every day | ORAL | 1 refills | Status: DC

## 2022-10-15 ENCOUNTER — Encounter: Payer: Self-pay | Admitting: Physician Assistant

## 2022-10-15 ENCOUNTER — Ambulatory Visit (INDEPENDENT_AMBULATORY_CARE_PROVIDER_SITE_OTHER): Payer: Self-pay | Admitting: Physician Assistant

## 2022-10-15 VITALS — BP 132/90 | HR 75 | Temp 98.0°F | Wt 222.6 lb

## 2022-10-15 DIAGNOSIS — W57XXXA Bitten or stung by nonvenomous insect and other nonvenomous arthropods, initial encounter: Secondary | ICD-10-CM

## 2022-10-15 DIAGNOSIS — S0086XA Insect bite (nonvenomous) of other part of head, initial encounter: Secondary | ICD-10-CM

## 2022-10-15 NOTE — Progress Notes (Signed)
[facility] [address]  [place], [place] [address] Phone [phone] Fax  [phone]  Worker's Compensation Report Form   Cory Houston Date of [place] Phone Number:[phone] Email: [place] [employer] Supervisor Supervisor:[name] Curator Notified:N  Date of Injury:10/15/2022 Time of Injury:1:50 PM Shift Worked:1st Location where injury occurred (address or landmark):[address]  Body Part Injured: Left sided face  Vital Signs BP (!) 132/90 (BP Location: Left Arm, Patient Position: Sitting, Cuff Size: Normal)   Pulse 75   Temp 98 F (36.7 C) (Tympanic)   Wt 222 lb 9.6 oz (101 kg)   SpO2 98%   BMI 31.05 kg/m   Injury Description   Blowing leaves, was stung on around left eye   Provider Note S: 66 y/o M was blowing leaves when he was stung by yellow jackets on the left side of face below  left eye. Pt was wearing eye protection, but the yellow jacket flew under the goggles and stung him. Pt denies h/o allergic reaction to insects.   O: Constitutional:      Appearance: Normal appearance. Able to speak without difficulty.  HENT:     Head: Atraumatic.     Right Ear: Tympanic membrane, ear canal occluded with cerumen, TM not visualized. External ear appears normal.     Left Ear: Tympanic membrane, ear canal occluded with cerumen, TM not visualized. External ear appears normal.      Nose: Nose normal. No hematoma or foreign bodies.     Mouth/Throat:     Mouth: Mucous membranes are moist.     Pharynx: Oropharynx is clear.  Eyes:     Extraocular Movements: Extraocular movements intact.  Cardiovascular:     Rate and Rhythm: Normal rate and regular rhythm.  Pulmonary:     Effort: Pulmonary effort is normal. No respiratory distress.     Breath sounds: Normal breath sounds. No wheezing, rhonchi or rales.      Musculoskeletal:     Cervical back: Neck supple.   Skin:    General: Skin is warm. Slight redness over the area between the bottom left eye and nose and upper left cheek.    Neurological:     Mental Status: He is alert.  A/P:  Apply ice packs to the affected area.  Take Benadryl at bedtime. 4 tabs of otc Diphenhydramine '25mg'$  supplied at the clinic today.  Take Claritin in the evening.  Continue to watch for worsening symptoms.  If symptoms worsen ie swelling, redness, difficulty with breathing or swelling of the eyelid then call 911 or go to ER immediately. Pt verbalized understanding and in agreement.  RTC tomorrow for re-evaluation.   Diagnosis  Insect bite of face without infection, initial encounter   Medications Prescribed  No orders of the defined types were placed in this encounter.   Referred to n/a    Return to Work Status Regular duty. Must wear eye protection   Provider Signature ________________________________________Date_________   Employee Signature _______________________________________Date_________   Please email this completed form to [name], Director of Risk Management at [email or web address] within 24 hours of visit.

## 2022-10-16 ENCOUNTER — Telehealth: Payer: Self-pay

## 2022-10-16 ENCOUNTER — Encounter: Payer: Self-pay | Admitting: Internal Medicine

## 2022-10-16 ENCOUNTER — Encounter: Payer: Self-pay | Admitting: Nurse Practitioner

## 2022-10-16 ENCOUNTER — Ambulatory Visit (INDEPENDENT_AMBULATORY_CARE_PROVIDER_SITE_OTHER): Payer: Self-pay | Admitting: Nurse Practitioner

## 2022-10-16 ENCOUNTER — Ambulatory Visit: Payer: BC Managed Care – PPO | Attending: Internal Medicine | Admitting: Internal Medicine

## 2022-10-16 VITALS — HR 65 | Temp 98.0°F

## 2022-10-16 DIAGNOSIS — E783 Hyperchylomicronemia: Secondary | ICD-10-CM

## 2022-10-16 DIAGNOSIS — I10 Essential (primary) hypertension: Secondary | ICD-10-CM | POA: Diagnosis not present

## 2022-10-16 DIAGNOSIS — Z8249 Family history of ischemic heart disease and other diseases of the circulatory system: Secondary | ICD-10-CM | POA: Diagnosis not present

## 2022-10-16 DIAGNOSIS — S0086XD Insect bite (nonvenomous) of other part of head, subsequent encounter: Secondary | ICD-10-CM

## 2022-10-16 DIAGNOSIS — W57XXXD Bitten or stung by nonvenomous insect and other nonvenomous arthropods, subsequent encounter: Secondary | ICD-10-CM

## 2022-10-16 DIAGNOSIS — E119 Type 2 diabetes mellitus without complications: Secondary | ICD-10-CM

## 2022-10-16 NOTE — Progress Notes (Signed)
Virtual Visit via Video Note   Because of Cory Houston's co-morbid illnesses, he is at least at moderate risk for complications without adequate follow up.  This format is felt to be most appropriate for this patient at this time.  All issues noted in this document were discussed and addressed.  A limited physical exam was performed with this format.  Please refer to the patient's chart for his consent to telehealth for [facility].      Date:  10/16/2022   ID:  Cory Houston, DOB Jul 15, 1956, MRN [id number] The patient was identified using 2 identifiers.  Evaluation Performed:  New Patient Evaluation  Patient Location:  [place] [id number]  Provider location:   [address] [address] [place], [place] [address]  PCP:  Merryl Hacker, No  Cardiologist:  [name], MD Electrophysiologist:  None   Chief Complaint:  High triglycerides  History of Present Illness:    Cory Houston is a 66 y.o. male who presents via audio/video conferencing for a telehealth visit today.  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  He says he lives in [place].  He has a longstanding history of high cholesterol including high triglycerides.  There is a family history of heart disease as well in his father who had coronary artery disease and ultimately had an LVAD and died several years ago.  He has been on combination therapy for high triglycerides but despite this continues to have elevations.  His last lipid profile this past summer, demonstrated total cholesterol 142, triglycerides 989 and HDL 17.  His last calculated LDL was a year ago at 104 however triglycerides were down to 349 at the time.  He does have a history of diabetes but recently has had better control with his hemoglobin A1c in the 7 to 8% range.  He tries to avoid sweets and particularly reduce his carbohydrates but may not necessarily keep fats low.  He was recently started on  a statin in addition to Vascepa, fenofibrate and ezetimibe.  He has not had lab work since then but has been on the statin at least for 3 months.  He denies any side effects with this.  He has no known cardiovascular disease.  Prior CV studies:   The following studies were reviewed today:  Labwork, chart reviewed  PMHx:  Past Medical History:  Diagnosis Date   Allergy    Arthritis    Diabetes mellitus without complication (HCC)    GERD (gastroesophageal reflux disease)    Hyperlipidemia    Hypertension    Sleep apnea     Past Surgical History:  Procedure Laterality Date   VASECTOMY      FAMHx:  Family History  Problem Relation Age of Onset   Diabetes Mother    Obesity Mother    Hypertension Mother    Heart disease Mother    Congestive Heart Failure Mother    Hypercholesterolemia Mother    Diabetes Father    Congestive Heart Failure Father         cardiopulmonary edema.- Father 79 deceased from complications.    Diverticulitis Father    Hypertension Father    Hypercholesterolemia Father    Cataracts Sister    Hypertension Brother    Hypertension Paternal Aunt    Hypertension Paternal Uncle    Heart attack Maternal Grandmother    Lung cancer Maternal Grandmother    Stroke Paternal Grandmother    Stroke Paternal Grandfather  Heart murmur Son     SOCHx:   reports that he has never smoked. He has never used smokeless tobacco. He reports current alcohol use. He reports that he does not use drugs.  ALLERGIES:  Allergies  Allergen Reactions   Codeine Anaphylaxis    MEDS:  Current Meds  Medication Sig   amLODipine (NORVASC) 10 MG tablet TAKE 1 TABLET BY MOUTH EVERY DAY   Cholecalciferol (VITAMIN D-3) 5000 units TABS Take 1 tablet by mouth every other day.    ezetimibe (ZETIA) 10 MG tablet Take 1 tablet (10 mg total) by mouth daily.   fenofibrate (TRICOR) 145 MG tablet Take 1 tablet (145 mg total) by mouth daily.   icosapent Ethyl (VASCEPA) 1 g capsule Take  2 capsules (2 g total) by mouth 2 (two) times daily.   JARDIANCE 10 MG TABS tablet TAKE 1 TABLET BY MOUTH EVERY DAY BEFORE BREAKFAST   loratadine (CLARITIN) 10 MG tablet TAKE 1 TABLET BY MOUTH EVERY DAY   metFORMIN (GLUCOPHAGE-XR) 500 MG 24 hr tablet TAKE 2 TABLETS BY MOUTH TWICE A DAY   metoprolol succinate (TOPROL XL) 25 MG 24 hr tablet Take 1 tablet (25 mg total) by mouth daily. Please keep schedule appointment for additional refills.   Multiple Vitamin (MULTIVITAMIN) tablet Take 1 tablet by mouth daily.   omeprazole (PRILOSEC) 20 MG capsule TAKE 1 CAPSULE BY MOUTH EVERY DAY   ramipril (ALTACE) 5 MG capsule TAKE 1 CAPSULE BY MOUTH DAILY PLEASE CALL OFFICE TO SCHEDULE LABS NOW & BLOOD PRESSURE RECHECK AFTER   rosuvastatin (CRESTOR) 40 MG tablet Take 1 tablet (40 mg total) by mouth daily.   triamcinolone cream (KENALOG) 0.1 % Apply 1 Application topically 2 (two) times daily. To affected area.     ROS: Pertinent items noted in HPI and remainder of comprehensive ROS otherwise negative.  Labs/Other Tests and Data Reviewed:    Recent Labs: 05/29/2022: ALT 59; BUN 17; Creatinine, Ser 0.95; Hemoglobin 17.2; Platelets 211; Potassium 4.4; Sodium 136; TSH 2.120   Recent Lipid Panel Lab Results  Component Value Date/Time   CHOL 142 05/29/2022 08:09 AM   TRIG 989 (HH) 05/29/2022 08:09 AM   HDL 17 (L) 05/29/2022 08:09 AM   CHOLHDL 8.4 (H) 05/29/2022 08:09 AM   CHOLHDL 6.2 08/15/2021 08:23 AM   LDLCALC Comment (A) 05/29/2022 08:09 AM   LDLDIRECT 65.7 08/15/2021 08:23 AM    Wt Readings from Last 3 Encounters:  10/15/22 222 lb 9.6 oz (101 kg)  08/07/22 224 lb 9.6 oz (101.9 kg)  06/18/22 226 lb (102.5 kg)     Exam:    Vital Signs:  There were no vitals taken for this visit.   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 has hyperchylomicronemia with very high trigs and family history of heart disease in his father.  Other cardiac risk factors include type 2 diabetes and hypertension.  Despite additional medications in the past he has had difficulty controlling his triglycerides.  I would like to provide him with some additional dietary recommendations.  He has been on a statin now in addition to his meds for several months.  This could provide another 20 to 30% reduction in triglycerides.  I will order repeat labs as he  is scheduled to get the drawn tomorrow.  Further recommendations based on this and he may be a candidate for one of our APO-CIII inhibitor trials. He indicated a willingness to participate.  Thanks again for the kind referral.  Patient Risk:   After full review of this patients clinical status, I feel that they are at least moderate risk at this time.  Time:   Today, I have spent 25 minutes with the patient with telehealth technology discussing high triglycerides.     Medication Adjustments/Labs and Tests Ordered: Current medicines are reviewed at length with the patient today.  Concerns regarding medicines are outlined above.   Tests Ordered: Orders Placed This Encounter  Procedures   Apolipoprotein B   NMR, lipoprofile   Lipoprotein A (LPA)    Medication Changes: No orders of the defined types were placed in this encounter.   Disposition:  in 4 month(s)  Pixie Casino, MD, [facility], [place] Director of the Advanced Lipid Disorders &  Cardiovascular Risk Reduction Clinic Diplomate of the American Board of Clinical Lipidology Attending Cardiologist  Direct Dial: [phone]  Fax: [phone]  Website:  [email or web address]  Pixie Casino, MD  10/16/2022 9:56 AM

## 2022-10-16 NOTE — Patient Instructions (Addendum)
Medication Instructions:   Your physician recommends that you continue on your current medications as directed. Please refer to the Current Medication list given to you today.  *If you need a refill on your cardiac medications before your next appointment, please call your pharmacy*  Lab Work: Your physician recommends that you return for lab work TODAY or TOMORROW:  APO B LP(a)  NMR  If you have labs (blood work) drawn today and your tests are completely normal, you will receive your results only by: [place] (if you have MyChart) OR A paper copy in the mail If you have any lab test that is abnormal or we need to change your treatment, we will call you to review the results.  Testing/Procedures: NONE ordered at this time of appointment   Follow-Up: At [facility], you and your health needs are our priority.  As part of our continuing mission to provide you with exceptional heart care, we have created designated Provider Care Teams.  These Care Teams include your primary Cardiologist (physician) and Advanced Practice Providers (APPs -  Physician Assistants and Nurse Practitioners) who all work together to provide you with the care you need, when you need it.   Your next appointment:   4 month(s)  The format for your next appointment:   Virtual Visit   Provider:   Dr. Debara Pickett     Other Instructions  Important Information About Sugar

## 2022-10-16 NOTE — Telephone Encounter (Signed)
  Patient Consent for Virtual Visit        Cory Houston has provided verbal consent on 10/16/2022 for a virtual visit (video or telephone).   CONSENT FOR VIRTUAL VISIT FOR:  Cory Houston  By participating in this virtual visit I agree to the following:  I hereby voluntarily request, consent and authorize [facility] and its employed or contracted physicians, physician assistants, nurse practitioners or other licensed health care professionals (the Practitioner), to provide me with telemedicine health care services (the "Services") as deemed necessary by the treating Practitioner. I acknowledge and consent to receive the Services by the Practitioner via telemedicine. I understand that the telemedicine visit will involve communicating with the Practitioner through live audiovisual communication technology and the disclosure of certain medical information by electronic transmission. I acknowledge that I have been given the opportunity to request an in-person assessment or other available alternative prior to the telemedicine visit and am voluntarily participating in the telemedicine visit.  I understand that I have the right to withhold or withdraw my consent to the use of telemedicine in the course of my care at any time, without affecting my right to future care or treatment, and that the Practitioner or I may terminate the telemedicine visit at any time. I understand that I have the right to inspect all information obtained and/or recorded in the course of the telemedicine visit and may receive copies of available information for a reasonable fee.  I understand that some of the potential risks of receiving the Services via telemedicine include:  Delay or interruption in medical evaluation due to technological equipment failure or disruption; Information transmitted may not be sufficient (e.g. poor resolution of images) to allow for appropriate medical decision making by the Practitioner;  and/or  In rare instances, security protocols could fail, causing a breach of personal health information.  Furthermore, I acknowledge that it is my responsibility to provide information about my medical history, conditions and care that is complete and accurate to the best of my ability. I acknowledge that Practitioner's advice, recommendations, and/or decision may be based on factors not within their control, such as incomplete or inaccurate data provided by me or distortions of diagnostic images or specimens that may result from electronic transmissions. I understand that the practice of medicine is not an exact science and that Practitioner makes no warranties or guarantees regarding treatment outcomes. I acknowledge that a copy of this consent can be made available to me via my patient portal ([facility]), or I can request a printed copy by calling the office of [facility].    I understand that my insurance will be billed for this visit.   I have read or had this consent read to me. I understand the contents of this consent, which adequately explains the benefits and risks of the Services being provided via telemedicine.  I have been provided ample opportunity to ask questions regarding this consent and the Services and have had my questions answered to my satisfaction. I give my informed consent for the services to be provided through the use of telemedicine in my medical care

## 2022-10-16 NOTE — Telephone Encounter (Signed)
Called patient to discuss AVS instructions gave Dr. Lysbeth Penner recommendations and patient voiced understanding. AVS summary mailed to patient.
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
## 2022-10-18 ENCOUNTER — Telehealth: Payer: Self-pay

## 2022-10-18 ENCOUNTER — Other Ambulatory Visit (HOSPITAL_COMMUNITY): Payer: Self-pay

## 2022-10-18 ENCOUNTER — Encounter: Payer: Self-pay | Admitting: Nurse Practitioner

## 2022-10-18 ENCOUNTER — Ambulatory Visit: Payer: BC Managed Care – PPO | Admitting: Nurse Practitioner

## 2022-10-18 VITALS — BP 120/82 | HR 69 | Temp 97.9°F | Resp 12 | Ht 71.0 in | Wt 223.4 lb

## 2022-10-18 DIAGNOSIS — I10 Essential (primary) hypertension: Secondary | ICD-10-CM

## 2022-10-18 DIAGNOSIS — Z23 Encounter for immunization: Secondary | ICD-10-CM

## 2022-10-18 DIAGNOSIS — E1165 Type 2 diabetes mellitus with hyperglycemia: Secondary | ICD-10-CM

## 2022-10-18 DIAGNOSIS — G4733 Obstructive sleep apnea (adult) (pediatric): Secondary | ICD-10-CM

## 2022-10-18 DIAGNOSIS — K219 Gastro-esophageal reflux disease without esophagitis: Secondary | ICD-10-CM

## 2022-10-18 DIAGNOSIS — E781 Pure hyperglyceridemia: Secondary | ICD-10-CM | POA: Diagnosis not present

## 2022-10-18 DIAGNOSIS — Z1211 Encounter for screening for malignant neoplasm of colon: Secondary | ICD-10-CM

## 2022-10-18 DIAGNOSIS — H6123 Impacted cerumen, bilateral: Secondary | ICD-10-CM | POA: Diagnosis not present

## 2022-10-18 LAB — POCT GLYCOSYLATED HEMOGLOBIN (HGB A1C): Hemoglobin A1C: 8.5 % — AB (ref 4.0–5.6)

## 2022-10-18 LAB — COMPREHENSIVE METABOLIC PANEL
ALT: 47 U/L (ref 0–53)
AST: 30 U/L (ref 0–37)
Albumin: 4.8 g/dL (ref 3.5–5.2)
Alkaline Phosphatase: 111 U/L (ref 39–117)
BUN: 18 mg/dL (ref 6–23)
CO2: 25 mEq/L (ref 19–32)
Calcium: 9.1 mg/dL (ref 8.4–10.5)
Chloride: 102 mEq/L (ref 96–112)
Creatinine, Ser: 1.05 mg/dL (ref 0.40–1.50)
GFR: 75.21 mL/min (ref >60.00)
Glucose, Bld: 205 mg/dL — ABNORMAL HIGH (ref 70–99)
Potassium: 4.1 mEq/L (ref 3.5–5.1)
Sodium: 136 mEq/L (ref 135–145)
Total Bilirubin: 0.6 mg/dL (ref 0.2–1.2)
Total Protein: 7.2 g/dL (ref 6.0–8.3)

## 2022-10-18 LAB — CBC
HCT: 46.4 % (ref 39.0–52.0)
Hemoglobin: 16.1 g/dL (ref 13.0–17.0)
MCHC: 34.6 g/dL (ref 30.0–36.0)
MCV: 85.7 fl (ref 78.0–100.0)
Platelets: 210 10*3/uL (ref 150.0–400.0)
RBC: 5.41 Mil/uL (ref 4.22–5.81)
RDW: 15 % (ref 11.5–15.5)
WBC: 6.8 10*3/uL (ref 4.0–10.5)

## 2022-10-18 LAB — MICROALBUMIN / CREATININE URINE RATIO
Creatinine,U: 45.6 mg/dL
Microalb Creat Ratio: 1.5 mg/g (ref 0.0–30.0)
Microalb, Ur: <0.7 mg/dL (ref 0.0–1.9)

## 2022-10-18 MED ORDER — TIRZEPATIDE 2.5 MG/0.5ML ~~LOC~~ SOAJ: 2.5000 mg | SUBCUTANEOUS | 0 refills | Status: DC

## 2022-10-18 NOTE — Assessment & Plan Note (Addendum)
Blood pressure under good control today.  Patient currently maintained on ramipril, metoprolol, amlodipine.  Continue medications as prescribed

## 2022-10-18 NOTE — Assessment & Plan Note (Signed)
Patient currently maintained on Vascepa, Tricor, Zetia, statin medication.  He is followed by lipid specialist Dr. Lyman Bishop.  Continue taking medication as prescribed and follow-up with specialist as recommended.

## 2022-10-18 NOTE — Patient Instructions (Signed)
Nice to see you today Dr. Frederico Hamman Copland is our sports medicine physician. Call and schedule at your convenience I want to see you in 3 months for a follow up on your diabetes  Cory Houston will be 2.'5mg'$  once a week for a month then we will titrate up to '5mg'$  once a week thereafter until I see you again

## 2022-10-18 NOTE — Assessment & Plan Note (Signed)
Verbal consent obtained.  Patient was prepped per office policy.  Cerumen softening eardrops were placed mixture of water and hydroperoxide was used to irrigate bilateral ears.  Patient tolerated procedure well.  Bilateral disimpaction successful

## 2022-10-18 NOTE — Progress Notes (Signed)
New Patient Office Visit  Subjective    Patient ID: Cory Houston, male    DOB: 07/10/1956  Age: 66 y.o. MRN: [id number]  CC:  Chief Complaint  Patient presents with   Establish Care    HPI Cory Houston presents to establish care   HTN: Does have a cuff. Does not check his BP at home. Tolerates BP medications well   HLD: 6-10 miles a day with employment. Followed By Dr. Lamount Cohen    DM2: Does check at home 2-3 times a week. States that he runs high in the am 130-140 and later in the afternoon 90-100. Tolerates metformin and jardanice well  GERD: Takes mediaction and well controlled. Endoscopy years ago   OSA: Has a CPAP but has been losing weight and has bot been using it as much. States less snoring not gasping and feels rested   Immunizations: -Tetanus:2023 -Influenza: 10/18/2022 -Shingles: first one today -Pneumonia: too young  -HPV: aged out   Eye exam: wears glasses has been approx 1.5 years. Needs updating     Colonoscopy:  amb refer   Other providers Dr. Chrissie Noa Hilty-lipid specialist Dr Lanae Crumbly Etang-cardiologist  Last CPE 06/04/2022-through occupational [facility]  Outpatient Encounter Medications as of 10/18/2022  Medication Sig   amLODipine (NORVASC) 10 MG tablet TAKE 1 TABLET BY MOUTH EVERY DAY   Cholecalciferol (VITAMIN D-3) 5000 units TABS Take 1 tablet by mouth every other day.    ezetimibe (ZETIA) 10 MG tablet Take 1 tablet (10 mg total) by mouth daily.   fenofibrate (TRICOR) 145 MG tablet Take 1 tablet (145 mg total) by mouth daily.   icosapent Ethyl (VASCEPA) 1 g capsule Take 2 capsules (2 g total) by mouth 2 (two) times daily.   JARDIANCE 10 MG TABS tablet TAKE 1 TABLET BY MOUTH EVERY DAY BEFORE BREAKFAST   loratadine (CLARITIN) 10 MG tablet TAKE 1 TABLET BY MOUTH EVERY DAY   metFORMIN (GLUCOPHAGE-XR) 500 MG 24 hr tablet TAKE 2 TABLETS BY MOUTH TWICE A DAY   metoprolol succinate (TOPROL XL) 25 MG 24 hr tablet Take 1 tablet  (25 mg total) by mouth daily. Please keep schedule appointment for additional refills.   Multiple Vitamin (MULTIVITAMIN) tablet Take 1 tablet by mouth daily.   omeprazole (PRILOSEC) 20 MG capsule TAKE 1 CAPSULE BY MOUTH EVERY DAY   ramipril (ALTACE) 5 MG capsule TAKE 1 CAPSULE BY MOUTH DAILY PLEASE CALL OFFICE TO SCHEDULE LABS NOW & BLOOD PRESSURE RECHECK AFTER   rosuvastatin (CRESTOR) 40 MG tablet Take 1 tablet (40 mg total) by mouth daily.   tirzepatide [facility]) 2.5 MG/0.5ML Pen Inject 2.5 mg into the skin once a week.   triamcinolone cream (KENALOG) 0.1 % Apply 1 Application topically 2 (two) times daily. To affected area.   No facility-administered encounter medications on file as of 10/18/2022.    Past Medical History:  Diagnosis Date   Allergy    Arthritis    Diabetes mellitus without complication (HCC)    GERD (gastroesophageal reflux disease)    Hypertension    Hypertriglyceridemia    Sleep apnea     Past Surgical History:  Procedure Laterality Date   VASECTOMY      Family History  Problem Relation Age of Onset   Diabetes Mother    Obesity Mother    Hypertension Mother    Heart disease Mother    Congestive Heart Failure Mother    Hypercholesterolemia Mother    Diabetes Father    Congestive Heart  Failure Father         cardiopulmonary edema.- Father 69 deceased from complications.    Diverticulitis Father    Hypertension Father    Hypercholesterolemia Father    Hearing loss Father    Cataracts Sister    Obesity Brother    Hypertension Brother    Heart murmur Son    Hypertension Paternal Aunt    Hypertension Paternal Uncle    Heart attack Maternal Grandmother    Lung cancer Maternal Grandmother    Stroke Paternal Grandmother    Stroke Paternal Grandfather     Social History   Socioeconomic History   Marital status: Married    Spouse name: Shanette   Number of children: 2   Years of education: Not on file   Highest education level: Not on file   Occupational History   Not on file  Tobacco Use   Smoking status: Never   Smokeless tobacco: Never  Vaping Use   Vaping Use: Never used  Substance and Sexual Activity   Alcohol use: Yes    Comment: rarely 1 per month   Drug use: No   Sexual activity: Yes    Birth control/protection: Surgical  Other Topics Concern   Not on file  Social History Narrative   4 step children    Widowed   Son (72) [place] (25) daughter       Fulltime: Insurance underwriter       Hobbies: model Museum/gallery curator   Social Determinants of Radio broadcast assistant Strain: Not on Art therapist Insecurity: Not on file  Transportation Needs: Not on file  Physical Activity: Not on file  Stress: Not on file  Social Connections: Not on file  Intimate Partner Violence: Not on file    Review of Systems  Constitutional:  Negative for chills and fever.  Respiratory:  Negative for shortness of breath.   Cardiovascular:  Negative for chest pain.  Gastrointestinal:  Negative for abdominal pain, blood in stool, diarrhea, melena, nausea and vomiting.       BM daily   Genitourinary:  Negative for dysuria and hematuria.  Neurological:  Negative for headaches.  Psychiatric/Behavioral:  Negative for hallucinations and suicidal ideas.         Objective    BP 120/82   Pulse 69   Temp 97.9 F (36.6 C) (Oral)   Resp 12   Ht '5\' 11"'$  (1.803 m)   Wt 223 lb 6 oz (101.3 kg)   SpO2 96%   BMI 31.15 kg/m   Physical Exam Vitals and nursing note reviewed.  Constitutional:      Appearance: Normal appearance.  HENT:     Right Ear: Ear canal and external ear normal. There is impacted cerumen.     Left Ear: Ear canal and external ear normal. There is impacted cerumen.  Eyes:     Extraocular Movements: Extraocular movements intact.     Pupils: Pupils are equal, round, and reactive to light.     Comments: Wears glasses  Cardiovascular:     Rate and Rhythm: Normal rate and regular rhythm.     Heart sounds:  Normal heart sounds.  Pulmonary:     Breath sounds: Normal breath sounds.  Abdominal:     General: Bowel sounds are normal.  Musculoskeletal:     Right lower leg: No edema.     Left lower leg: No edema.  Lymphadenopathy:     Cervical: No cervical adenopathy.  Skin:  General: Skin is warm.  Neurological:     Mental Status: He is alert.  Psychiatric:        Mood and Affect: Mood normal.        Behavior: Behavior normal.        Thought Content: Thought content normal.        Judgment: Judgment normal.         Assessment & Plan:   Problem List Items Addressed This Visit       Cardiovascular and Mediastinum   Hypertension    Blood pressure under good control today.  Patient currently maintained on ramipril, metoprolol, amlodipine.  Continue medications as prescribed      Relevant Orders   CBC   Comprehensive metabolic panel     Respiratory   OSA (obstructive sleep apnea)    Patient does have a CPAP but does not wear it super often.  States he is lost approximately 30 pounds and is not snoring as much and does not feel as tired when he wakes up.  Consider having a retitration study.  Encourage patient to wear CPAP and discussed sequela of untreated sleep apnea        Digestive   Gastroesophageal reflux disease    Well-controlled with the use of medication, omeprazole.  Continue        Endocrine   Uncontrolled diabetes mellitus with hyperglycemia (HCC)    A1c above 8 today.  Continue Jardiance 10 and metformin 1000 twice daily.  Will add on Mounjaro.  Patient has no history of medullary thyroid cancer, multiple endocrine neoplasia syndrome type II or pancreatitis.  We will do 2.5 mg once weekly for 4 weeks then titrate to 5 mg weekly thereafter      Relevant Medications   tirzepatide (MOUNJARO) 2.5 MG/0.5ML Pen   Other Relevant Orders   POCT glycosylated hemoglobin (Hb A1C) (Completed)   Microalbumin/Creatinine Ratio, Urine   CBC   Comprehensive metabolic  panel     Nervous and Auditory   Bilateral impacted cerumen    Verbal consent obtained.  Patient was prepped per office policy.  Cerumen softening eardrops were placed mixture of water and hydroperoxide was used to irrigate bilateral ears.  Patient tolerated procedure well.  Bilateral disimpaction successful      Relevant Orders   Ear Lavage     Other   Hypertriglyceridemia    Patient currently maintained on Vascepa, Tricor, Zetia, statin medication.  He is followed by lipid specialist Dr. Lyman Bishop.  Continue taking medication as prescribed and follow-up with specialist as recommended.      Other Visit Diagnoses     Need for immunization against influenza    -  Primary   Relevant Orders   Flu Vaccine QUAD 6+ mos PF IM (Fluarix Quad PF) (Completed)   Screening for colon cancer       Relevant Orders   Ambulatory referral to Gastroenterology   Need for shingles vaccine       Relevant Orders   Zoster Recombinant (Shingrix ) (Completed)       Return in about 3 months (around 01/17/2023) for DM 2 recheck .   Romilda Garret, NP

## 2022-10-18 NOTE — Assessment & Plan Note (Signed)
A1c above 8 today.  Continue Jardiance 10 and metformin 1000 twice daily.  Will add on Mounjaro.  Patient has no history of medullary thyroid cancer, multiple endocrine neoplasia syndrome type II or pancreatitis.  We will do 2.5 mg once weekly for 4 weeks then titrate to 5 mg weekly thereafter

## 2022-10-18 NOTE — Telephone Encounter (Signed)
Mounjaro needs PA via covermeds. Key [id number]

## 2022-10-18 NOTE — Telephone Encounter (Signed)
Pharmacy Patient Advocate Encounter   Received notification from [facility] that prior authorization for Mounjaro 2.'5mg'$ /0.61m is required/requested.    PA submitted on 10/18/22 to [employer] via CoverMyMeds  Key [id number]  Status is pending

## 2022-10-18 NOTE — Assessment & Plan Note (Signed)
Patient does have a CPAP but does not wear it super often.  States he is lost approximately 30 pounds and is not snoring as much and does not feel as tired when he wakes up.  Consider having a retitration study.  Encourage patient to wear CPAP and discussed sequela of untreated sleep apnea

## 2022-10-18 NOTE — Assessment & Plan Note (Signed)
Well-controlled with the use of medication, omeprazole.  Continue

## 2022-10-21 NOTE — Telephone Encounter (Signed)
Patient is diabetic is currently on metformin and is not controlled. This should be approved by the information sent to me

## 2022-10-21 NOTE — Telephone Encounter (Signed)
Pharmacy Patient Advocate Encounter  Received notification from [facility] that the request for prior authorization for [facility] 2.'5MG'$ /0.5ML has been denied due to .

## 2022-10-22 NOTE — Telephone Encounter (Signed)
Resubmitted PA: [place] Patient Advocate Encounter  Prior Authorization for Darcel Bayley has been approved.    PA# [id number] Effective dates: 10/22/2022 through 10/21/2023

## 2022-10-25 ENCOUNTER — Telehealth: Payer: Self-pay | Admitting: Nurse Practitioner

## 2022-10-25 NOTE — Telephone Encounter (Signed)
Pt called stating he was prescribed tirzepatide [facility]) 2.5 MG/0.5ML Pen by Charmian Muff & it was denied by his insurance. Pt asked is there another type of medication he could get or what should he do? Call back # [phone]

## 2022-10-28 ENCOUNTER — Other Ambulatory Visit: Payer: Self-pay

## 2022-10-28 MED ORDER — ICOSAPENT ETHYL 1 G PO CAPS: 2.0000 g | ORAL_CAPSULE | Freq: Two times a day (BID) | ORAL | 0 refills | Status: DC

## 2022-10-28 NOTE — Telephone Encounter (Signed)
Can we call the pharmacy and see the reason for denial

## 2022-10-29 NOTE — Telephone Encounter (Signed)
  Called pharmacy and they said they never got the approval letter. I did call the pt to inform him that it has been approved.

## 2022-10-31 NOTE — Telephone Encounter (Signed)
Patient notified, he will start regimen now and then resume when he get back from trip.

## 2022-10-31 NOTE — Telephone Encounter (Signed)
Patient called and stated he is going on a trip to [place] 11/21/2022-12/11/2022 and he can't guarantee the Cory Houston will be refrigerated and wanted a call back.. Call back number [phone]

## 2022-10-31 NOTE — Telephone Encounter (Signed)
Called and spoke with patient. He asked the pharmacy when he picked up the medication if it had to be refrigerated and they advised him that it would be best to keep it in the fridge till 1 hour before injecting. With this knowledge, the patient is wanting to know if it would be best to wait and start the regimen when he gets back from [place] or should he go ahead and do a couple weeks worth of injections before he leaves, skip a couple weeks while he is gone and then resume when he returns?  Please advise.

## 2022-10-31 NOTE — Telephone Encounter (Signed)
He can start it and then stop while he is out of country and then start back when he comes back. Hopefully we will get some better glucose control with that

## 2022-11-05 ENCOUNTER — Other Ambulatory Visit: Payer: Self-pay

## 2022-11-05 ENCOUNTER — Other Ambulatory Visit: Payer: Self-pay | Admitting: Physician Assistant

## 2022-11-05 DIAGNOSIS — E783 Hyperchylomicronemia: Secondary | ICD-10-CM

## 2022-11-06 LAB — SPECIMEN STATUS

## 2022-11-06 LAB — LIPID PANEL W/O CHOL/HDL RATIO
Cholesterol, Total: 96 mg/dL — ABNORMAL LOW (ref 100–199)
HDL: 23 mg/dL — ABNORMAL LOW (ref >39)
LDL Chol Calc (NIH): 18 mg/dL (ref 0–99)
Triglycerides: 396 mg/dL — ABNORMAL HIGH (ref 0–149)
VLDL Cholesterol Cal: 55 mg/dL — ABNORMAL HIGH (ref 5–40)

## 2022-11-07 LAB — NMR, LIPOPROFILE
Cholesterol, Total: 112 mg/dL (ref 100–199)
HDL Particle Number: 26.8 umol/L — ABNORMAL LOW (ref >=30.5)
HDL-C: 25 mg/dL — ABNORMAL LOW (ref >39)
LDL Particle Number: 494 nmol/L (ref <1000)
LDL Size: 19.7 nm — ABNORMAL LOW (ref >20.5)
LDL-C (NIH Calc): 32 mg/dL (ref 0–99)
LP-IR Score: 77 — ABNORMAL HIGH (ref <=45)
Small LDL Particle Number: 399 nmol/L (ref <=527)
Triglycerides: 378 mg/dL — ABNORMAL HIGH (ref 0–149)

## 2022-11-07 LAB — APOLIPOPROTEIN B: Apolipoprotein B: 65 mg/dL (ref <90)

## 2022-11-07 LAB — LIPOPROTEIN A (LPA): Lipoprotein (a): 60.1 nmol/L (ref <75.0)

## 2022-11-08 ENCOUNTER — Encounter: Payer: Self-pay | Admitting: Cardiology

## 2022-11-08 ENCOUNTER — Ambulatory Visit: Payer: BC Managed Care – PPO | Attending: Cardiology | Admitting: Cardiology

## 2022-11-08 VITALS — BP 120/70 | HR 61 | Ht 70.0 in | Wt 225.0 lb

## 2022-11-08 DIAGNOSIS — I10 Essential (primary) hypertension: Secondary | ICD-10-CM | POA: Diagnosis not present

## 2022-11-08 DIAGNOSIS — E781 Pure hyperglyceridemia: Secondary | ICD-10-CM | POA: Diagnosis not present

## 2022-11-08 DIAGNOSIS — I493 Ventricular premature depolarization: Secondary | ICD-10-CM | POA: Diagnosis not present

## 2022-11-08 NOTE — Patient Instructions (Signed)
Medication Instructions:  No changes *If you need a refill on your cardiac medications before your next appointment, please call your pharmacy*   Lab Work: None ordered If you have labs (blood work) drawn today and your tests are completely normal, you will receive your results only by: [place] (if you have MyChart) OR A paper copy in the mail If you have any lab test that is abnormal or we need to change your treatment, we will call you to review the results.   Testing/Procedures: None ordered   Follow-Up: At [facility], you and your health needs are our priority.  As part of our continuing mission to provide you with exceptional heart care, we have created designated Provider Care Teams.  These Care Teams include your primary Cardiologist (physician) and Advanced Practice Providers (APPs -  Physician Assistants and Nurse Practitioners) who all work together to provide you with the care you need, when you need it.  We recommend signing up for the patient portal called "MyChart".  Sign up information is provided on this After Visit Summary.  MyChart is used to connect with patients for Virtual Visits (Telemedicine).  Patients are able to view lab/test results, encounter notes, upcoming appointments, etc.  Non-urgent messages can be sent to your provider as well.   To learn more about what you can do with MyChart, go to [email or web address].    Your next appointment:   12 month(s)  The format for your next appointment:   In Person  Provider:   You may see [name], MD or one of the following Advanced Practice Providers on your designated Care Team:   Murray Hodgkins, NP Christell Faith, PA-C Cadence Kathlen Mody, PA-C Gerrie Nordmann, NP

## 2022-11-08 NOTE — Progress Notes (Signed)
Cardiology Office Note:    Date:  11/08/2022   ID:  Kamar, Callender 1956/01/31, MRN [id number]  PCP:  Michela Pitcher, NP   [place]  Cardiologist:  [name], MD  Advanced Practice Provider:  No care team member to display Electrophysiologist:  None       Referring MD: No ref. provider found   Chief Complaint  Patient presents with   5 month follow up     "Doing well." Medications reviewed by the patient verbally. 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     History of Present Illness:    Cory Houston is a 66 y.o. male with a hx of hypertension, hypertriglyceridemia, PVCs, diabetes presents for follow-up.   He feels well, denies palpitations.  Compliant with cholesterol medicines as prescribed.  Last cholesterol check showed improved triglyceride numbers.  Follows up with lipid clinic.  Planning a trip to [place] in the next several weeks.  States feeling great, no new concerns at this time.   Prior notes Cardiac monitor 02/2021 frequent PVCs, 2.3% PVC burden.   Past Medical History:  Diagnosis Date   Allergy    Arthritis    Diabetes mellitus without complication (HCC)    GERD (gastroesophageal reflux disease)    Hypertension    Hypertriglyceridemia    Sleep apnea     Past Surgical History:  Procedure Laterality Date   VASECTOMY      Current Medications: Current Meds  Medication Sig   amLODipine (NORVASC) 10 MG tablet TAKE 1 TABLET BY MOUTH EVERY DAY   Cholecalciferol (VITAMIN D-3) 5000 units TABS Take 1 tablet by mouth every other day.    ezetimibe (ZETIA) 10 MG tablet Take 1 tablet (10 mg total) by mouth daily.   fenofibrate (TRICOR) 145 MG tablet Take 1 tablet (145 mg total) by mouth daily.   icosapent Ethyl (VASCEPA) 1 g capsule Take 2 capsules (2 g total) by mouth 2 (two) times daily.   JARDIANCE 10 MG TABS tablet  TAKE 1 TABLET BY MOUTH EVERY DAY BEFORE BREAKFAST   loratadine (CLARITIN) 10 MG tablet TAKE 1 TABLET BY MOUTH EVERY DAY   metFORMIN (GLUCOPHAGE-XR) 500 MG 24 hr tablet TAKE 2 TABLETS BY MOUTH TWICE A DAY   metoprolol succinate (TOPROL XL) 25 MG 24 hr tablet Take 1 tablet (25 mg total) by mouth daily. Please keep schedule appointment for additional refills.   Multiple Vitamin (MULTIVITAMIN) tablet Take 1 tablet by mouth daily.   omeprazole (PRILOSEC) 20 MG capsule TAKE 1 CAPSULE BY MOUTH EVERY DAY   ramipril (ALTACE) 5 MG capsule TAKE 1 CAPSULE BY MOUTH DAILY PLEASE CALL OFFICE TO SCHEDULE LABS NOW & BLOOD PRESSURE RECHECK AFTER   rosuvastatin (CRESTOR) 40 MG tablet Take 1 tablet (40 mg total) by mouth daily.   tirzepatide [facility]) 2.5 MG/0.5ML Pen Inject 2.5 mg into the skin once a week.   triamcinolone cream (KENALOG) 0.1 % Apply 1 Application topically 2 (two) times daily. To affected area.     Allergies:   Codeine   Social History   Socioeconomic History   Marital status: Married    Spouse name: Shanette   Number of children: 2   Years of education: Not on file   Highest education level: Not on file  Occupational History   Not on file  Tobacco Use   Smoking status: Never  Smokeless tobacco: Never  Vaping Use   Vaping Use: Never used  Substance and Sexual Activity   Alcohol use: Yes    Comment: rarely 1 per month   Drug use: No   Sexual activity: Yes    Birth control/protection: Surgical  Other Topics Concern   Not on file  Social History Narrative   4 step children    Widowed   Son (39) [place] (42) daughter       Fulltime: Insurance underwriter       Hobbies: model Museum/gallery curator   Social Determinants of Radio broadcast assistant Strain: Not on Art therapist Insecurity: Not on file  Transportation Needs: Not on file  Physical Activity: Not on file  Stress: Not on file  Social Connections: Not on file     Family History: The patient's family history  includes Cataracts in his sister; Congestive Heart Failure in his father and mother; Diabetes in his father and mother; Diverticulitis in his father; Hearing loss in his father; Heart attack in his maternal grandmother; Heart disease in his mother; Heart murmur in his son; Hypercholesterolemia in his father and mother; Hypertension in his brother, father, mother, paternal aunt, and paternal uncle; Lung cancer in his maternal grandmother; Obesity in his brother and mother; Stroke in his paternal grandfather and paternal grandmother.  ROS:   Please see the history of present illness.     All other systems reviewed and are negative.  EKGs/Labs/Other Studies Reviewed:    The following studies were reviewed today:   EKG:  EKG is ordered today.  EKG shows normal sinus rhythm, normal ECG  Recent Labs: 05/29/2022: TSH 2.120 10/18/2022: ALT 47; BUN 18; Creatinine, Ser 1.05; Potassium 4.1; Sodium 136 11/05/2022: Hemoglobin WILL FOLLOW; Platelets WILL FOLLOW  Recent Lipid Panel    Component Value Date/Time   CHOL 96 (L) 11/05/2022 1117   TRIG 396 (H) 11/05/2022 1117   HDL 23 (L) 11/05/2022 1117   CHOLHDL 8.4 (H) 05/29/2022 0809   CHOLHDL 6.2 08/15/2021 0823   VLDL NOT CALCULATED 08/15/2021 0823   LDLCALC 18 11/05/2022 1117   LDLDIRECT 65.7 08/15/2021 0823     Risk Assessment/Calculations:      Physical Exam:    VS:  BP 120/70 (BP Location: Left Arm, Patient Position: Sitting, Cuff Size: Normal)   Pulse 61   Ht '5\' 10"'$  (1.778 m)   Wt 225 lb (102.1 kg)   SpO2 99%   BMI 32.28 kg/m     Wt Readings from Last 3 Encounters:  11/08/22 225 lb (102.1 kg)  10/18/22 223 lb 6 oz (101.3 kg)  10/15/22 222 lb 9.6 oz (101 kg)     GEN:  Well nourished, well developed in no acute distress HEENT: Normal NECK: No JVD; No carotid bruits CARDIAC: RRR, no murmurs, rubs, gallops RESPIRATORY:  Clear to auscultation without rales, wheezing or rhonchi  ABDOMEN: Soft, non-tender,  non-distended MUSCULOSKELETAL:  No edema; No deformity  SKIN: Warm and dry NEUROLOGIC:  Alert and oriented x 3 PSYCHIATRIC:  Normal affect   ASSESSMENT:    1. PVC's (premature ventricular contractions)   2. Pure hypertriglyceridemia   3. Primary hypertension    PLAN:    In order of problems listed above:  Frequent PVCs, palpitations, resolved with Toprol, continue Toprol-XL 25 mg daily. hypertriglyceridemia.  Triglycerides improving.  Continue Crestor 40 mg daily Zetia, Vascepa,  fenofibrate.  Keep follow-up with lipid clinic. Hypertension, BP controlled.  Continue Norvasc 10 and  ramipril '5mg'$ .  Follow-up in 1 year.  Medication Adjustments/Labs and Tests Ordered: Current medicines are reviewed at length with the patient today.  Concerns regarding medicines are outlined above.  Orders Placed This Encounter  Procedures   EKG 12-Lead    No orders of the defined types were placed in this encounter.    Patient Instructions  Medication Instructions:  No changes *If you need a refill on your cardiac medications before your next appointment, please call your pharmacy*   Lab Work: None ordered If you have labs (blood work) drawn today and your tests are completely normal, you will receive your results only by: [place] (if you have MyChart) OR A paper copy in the mail If you have any lab test that is abnormal or we need to change your treatment, we will call you to review the results.   Testing/Procedures: None ordered   Follow-Up: At [facility], you and your health needs are our priority.  As part of our continuing mission to provide you with exceptional heart care, we have created designated Provider Care Teams.  These Care Teams include your primary Cardiologist (physician) and Advanced Practice Providers (APPs -  Physician Assistants and Nurse Practitioners) who all work together to provide you with the care you need, when you need it.  We recommend  signing up for the patient portal called "MyChart".  Sign up information is provided on this After Visit Summary.  MyChart is used to connect with patients for Virtual Visits (Telemedicine).  Patients are able to view lab/test results, encounter notes, upcoming appointments, etc.  Non-urgent messages can be sent to your provider as well.   To learn more about what you can do with MyChart, go to [email or web address].    Your next appointment:   12 month(s)  The format for your next appointment:   In Person  Provider:   You may see [name], MD or one of the following Advanced Practice Providers on your designated Care Team:   Murray Hodgkins, NP Christell Faith, PA-C Cadence Kathlen Mody, PA-C Gerrie Nordmann, NP        Signed, [name], MD  11/08/2022 10:31 AM    [place]

## 2022-11-19 ENCOUNTER — Telehealth: Payer: Self-pay | Admitting: Nurse Practitioner

## 2022-11-19 ENCOUNTER — Other Ambulatory Visit: Payer: Self-pay

## 2022-11-19 MED ORDER — METOPROLOL SUCCINATE ER 25 MG PO TB24: 25.0000 mg | ORAL_TABLET | Freq: Every day | ORAL | 3 refills | Status: DC

## 2022-11-19 NOTE — Telephone Encounter (Signed)
  Encourage patient to contact the pharmacy for refills or they can request refills through [place]:  Please schedule appointment if longer than 1 year  NEXT APPOINTMENT DATE:  MEDICATION:JARDIANCE 10 MG TABS tablet   metFORMIN (GLUCOPHAGE-XR) 500 MG 24 hr tablet  Is the patient out of medication?   [id number] [facility] [address]    Let patient know to contact pharmacy at the end of the day to make sure medication is ready.  Please notify patient to allow 48-72 hours to process  CLINICAL FILLS OUT ALL BELOW:   LAST REFILL:  QTY:  REFILL DATE:    OTHER COMMENTS:    Okay for refill?  Please advise

## 2022-11-19 NOTE — Telephone Encounter (Signed)
Patient seen 10/18/22 for New Patient.  Per chart patient has not had Jardiance or Metformin sent to pharmacy in a long time.  [facility]'s [facility]2022 and nothing since.   Please advise, thanks!

## 2022-11-20 ENCOUNTER — Other Ambulatory Visit: Payer: Self-pay | Admitting: Nurse Practitioner

## 2022-11-20 DIAGNOSIS — E119 Type 2 diabetes mellitus without complications: Secondary | ICD-10-CM

## 2022-11-20 DIAGNOSIS — R7309 Other abnormal glucose: Secondary | ICD-10-CM

## 2022-11-20 MED ORDER — METFORMIN HCL ER 500 MG PO TB24: 1000.0000 mg | ORAL_TABLET | Freq: Two times a day (BID) | ORAL | 1 refills | Status: DC

## 2022-11-20 MED ORDER — EMPAGLIFLOZIN 10 MG PO TABS: ORAL_TABLET | ORAL | 1 refills | Status: DC

## 2022-11-20 NOTE — Telephone Encounter (Signed)
Pt notified that Jardiance and metformin had been sent to [facility] [place] by Romilda Garret NP. Pt voiced understanding; nothing further needed and pt voiced appreciation.

## 2022-11-20 NOTE — Telephone Encounter (Signed)
Refill sent in

## 2022-11-20 NOTE — Telephone Encounter (Signed)
[place] Night - Client TELEPHONE ADVICE RECORD AccessNurse Patient Name: Cory Houston Gender: Male DOB: 24-Dec-1955 Age: 66 Y 2 M 24 D Return Phone Number: [phone] (Primary) Address: City/ State/ Zip: [place]  [address] [place] Night - Client Client Site [place] Provider Romilda Garret- NP Contact Type Call Who Is Calling Patient / Member / Family / Caregiver Call Type Triage / Clinical Relationship To Patient Self Return Phone Number [phone] (Primary) Chief Complaint Prescription Refill or Medication Request (non symptomatic) Reason for Call Medication Question / Request Initial Comment Caller states he called earlier about his prescription and they are not approved yet. No current sx. he is going on a trip and neeeds it prior ro leaving. Translation No Nurse Assessment Nurse: Lucky Cowboy, RN, Levada Dy Date/Time (Eastern Time): 11/20/2022 7:51:58 AM Confirm and document reason for call. If symptomatic, describe symptoms. ---Caller stated that he needs Metformin and Jardiance refilled before his trip on Thurs. Denies any s/s. He has meds to get him through until next week, but he is leaving for [place] early in the morning. Ins to call back to the office when open. Does the patient have any new or worsening symptoms? ---No Disp. Time Eilene Ghazi Time) Disposition Final User 11/19/2022 6:03:48 PM Send To Nurse Lonia Farber, RN, Greg 11/20/2022 1:56:04 AM FINAL ATTEMPT MADE - message left Richardean Chimera 11/20/2022 1:56:16 AM Send to RN Final Attempt Fanny Bien, RN, Noah Delaine 11/20/2022 7:53:50 AM Clinical Call Yes Lucky Cowboy, RN, Levada Dy Final Disposition 11/20/2022 7:53:50 AM Clinical Call Yes Lucky Cowboy, RN, Glenard Haring

## 2022-11-25 ENCOUNTER — Other Ambulatory Visit: Payer: Self-pay

## 2022-11-25 MED ORDER — ROSUVASTATIN CALCIUM 40 MG PO TABS: 40.0000 mg | ORAL_TABLET | Freq: Every day | ORAL | 11 refills | Status: DC

## 2022-12-14 ENCOUNTER — Other Ambulatory Visit: Payer: Self-pay | Admitting: Nurse Practitioner

## 2022-12-14 DIAGNOSIS — E1165 Type 2 diabetes mellitus with hyperglycemia: Secondary | ICD-10-CM

## 2022-12-14 DIAGNOSIS — I10 Essential (primary) hypertension: Secondary | ICD-10-CM

## 2022-12-16 ENCOUNTER — Other Ambulatory Visit: Payer: Self-pay

## 2022-12-16 MED ORDER — FENOFIBRATE 145 MG PO TABS: 145.0000 mg | ORAL_TABLET | Freq: Every day | ORAL | 9 refills | Status: DC

## 2022-12-16 MED ORDER — EZETIMIBE 10 MG PO TABS: 10.0000 mg | ORAL_TABLET | Freq: Every day | ORAL | 9 refills | Status: DC

## 2022-12-16 MED ORDER — ICOSAPENT ETHYL 1 G PO CAPS: 2.0000 g | ORAL_CAPSULE | Freq: Two times a day (BID) | ORAL | 9 refills | Status: DC

## 2022-12-17 ENCOUNTER — Other Ambulatory Visit: Payer: Self-pay | Admitting: Nurse Practitioner

## 2022-12-17 DIAGNOSIS — E1165 Type 2 diabetes mellitus with hyperglycemia: Secondary | ICD-10-CM

## 2022-12-17 MED ORDER — TIRZEPATIDE 5 MG/0.5ML ~~LOC~~ SOAJ: 5.0000 mg | SUBCUTANEOUS | 0 refills | Status: DC

## 2022-12-18 ENCOUNTER — Telehealth: Payer: Self-pay | Admitting: Nurse Practitioner

## 2022-12-18 DIAGNOSIS — I10 Essential (primary) hypertension: Secondary | ICD-10-CM

## 2022-12-18 MED ORDER — RAMIPRIL 5 MG PO CAPS: 5.0000 mg | ORAL_CAPSULE | Freq: Every day | ORAL | 1 refills | Status: DC

## 2022-12-18 NOTE — Telephone Encounter (Signed)
Patient was advised to stop taking medication tirzepatide [facility]) 5 MG/0.5ML Pen  while he was on vacation in [place]. He has now returned and would like to start back this medication. Just needs it called into the pharmacy [id number] [address] [phone] Fax: [phone]

## 2022-12-18 NOTE — Telephone Encounter (Signed)
Medication was sent to pharmacy yesterday.

## 2022-12-18 NOTE — Telephone Encounter (Signed)
Prescription Request  12/18/2022  Is this a "Controlled Substance" medicine? No  LOV: 10/18/2022  What is the name of the medication or equipment? ramipril (ALTACE) 5 MG capsule   Have you contacted your pharmacy to request a refill? Yes   Which pharmacy would you like this sent to?  [id number]: [phone]: [phone]     Patient notified that their request is being sent to the clinical staff for review and that they should receive a response within 2 business days.   Please advise at Mobile [phone](mobile)

## 2022-12-18 NOTE — Telephone Encounter (Signed)
Medication sent in

## 2022-12-18 NOTE — Addendum Note (Signed)
Addended by: Michela Pitcher on: 12/18/2022 05:03 PM   Modules accepted: Orders

## 2022-12-20 ENCOUNTER — Ambulatory Visit (AMBULATORY_SURGERY_CENTER): Payer: BC Managed Care – PPO

## 2022-12-20 VITALS — Ht 70.0 in | Wt 225.0 lb

## 2022-12-20 DIAGNOSIS — Z1211 Encounter for screening for malignant neoplasm of colon: Secondary | ICD-10-CM

## 2022-12-20 MED ORDER — NA SULFATE-K SULFATE-MG SULF 17.5-3.13-1.6 GM/177ML PO SOLN: 1.0000 | Freq: Once | ORAL | 0 refills | Status: AC

## 2022-12-20 NOTE — Progress Notes (Signed)

## 2022-12-30 ENCOUNTER — Encounter: Payer: Self-pay | Admitting: Internal Medicine

## 2023-01-10 ENCOUNTER — Encounter: Payer: Self-pay | Admitting: Internal Medicine

## 2023-01-10 ENCOUNTER — Ambulatory Visit (AMBULATORY_SURGERY_CENTER): Payer: BC Managed Care – PPO | Admitting: Internal Medicine

## 2023-01-10 VITALS — BP 114/67 | HR 77 | Temp 97.8°F | Resp 12 | Ht 70.0 in | Wt 225.0 lb

## 2023-01-10 DIAGNOSIS — D124 Benign neoplasm of descending colon: Secondary | ICD-10-CM

## 2023-01-10 DIAGNOSIS — D128 Benign neoplasm of rectum: Secondary | ICD-10-CM | POA: Diagnosis not present

## 2023-01-10 DIAGNOSIS — Z1211 Encounter for screening for malignant neoplasm of colon: Secondary | ICD-10-CM | POA: Diagnosis present

## 2023-01-10 DIAGNOSIS — D122 Benign neoplasm of ascending colon: Secondary | ICD-10-CM

## 2023-01-10 DIAGNOSIS — D120 Benign neoplasm of cecum: Secondary | ICD-10-CM

## 2023-01-10 MED ORDER — SODIUM CHLORIDE 0.9 % IV SOLN: 500.0000 mL | Freq: Once | INTRAVENOUS | Status: DC

## 2023-01-10 NOTE — Patient Instructions (Signed)
Thank you for letting us take care of your healthcare needs today. Please see handouts given to you on Polyps, Diverticulosis and Hemorrhoids.    YOU HAD AN ENDOSCOPIC PROCEDURE TODAY AT [facility]:   Refer to the procedure report that was given to you for any specific questions about what was found during the examination.  If the procedure report does not answer your questions, please call your gastroenterologist to clarify.  If you requested that your care partner not be given the details of your procedure findings, then the procedure report has been included in a sealed envelope for you to review at your convenience later.  YOU SHOULD EXPECT: Some feelings of bloating in the abdomen. Passage of more gas than usual.  Walking can help get rid of the air that was put into your GI tract during the procedure and reduce the bloating. If you had a lower endoscopy (such as a colonoscopy or flexible sigmoidoscopy) you may notice spotting of blood in your stool or on the toilet paper. If you underwent a bowel prep for your procedure, you may not have a normal bowel movement for a few days.  Please Note:  You might notice some irritation and congestion in your nose or some drainage.  This is from the oxygen used during your procedure.  There is no need for concern and it should clear up in a day or so.  SYMPTOMS TO REPORT IMMEDIATELY:  Following lower endoscopy (colonoscopy or flexible sigmoidoscopy):  Excessive amounts of blood in the stool  Significant tenderness or worsening of abdominal pains  Swelling of the abdomen that is new, acute  Fever of 100F or higher   For urgent or emergent issues, a gastroenterologist can be reached at any hour by calling [phone]. Do not use MyChart messaging for urgent concerns.    DIET:  We do recommend a small meal at first, but then you may proceed to your regular diet.  Drink plenty of fluids but you should avoid alcoholic beverages for  24 hours.  ACTIVITY:  You should plan to take it easy for the rest of today and you should NOT DRIVE or use heavy machinery until tomorrow (because of the sedation medicines used during the test).    FOLLOW UP: Our staff will call the number listed on your records the next business day following your procedure.  We will call around 7:15- 8:00 am to check on you and address any questions or concerns that you may have regarding the information given to you following your procedure. If we do not reach you, we will leave a message.     If any biopsies were taken you will be contacted by phone or by letter within the next 1-3 weeks.  Please call us at [phone] if you have not heard about the biopsies in 3 weeks.    SIGNATURES/CONFIDENTIALITY: You and/or your care partner have signed paperwork which will be entered into your electronic medical record.  These signatures attest to the fact that that the information above on your After Visit Summary has been reviewed and is understood.  Full responsibility of the confidentiality of this discharge information lies with you and/or your care-partner.

## 2023-01-10 NOTE — Progress Notes (Signed)
GASTROENTEROLOGY PROCEDURE H&P NOTE   Primary Care Physician: Michela Pitcher, NP    Reason for Procedure:   Colon cancer screening  Plan:    colonoscopy  Patient is appropriate for endoscopic procedure(s) in the ambulatory ([facility]) setting.  The nature of the procedure, as well as the risks, benefits, and alternatives were carefully and thoroughly reviewed with the patient. Ample time for discussion and questions allowed. The patient understood, was satisfied, and agreed to proceed.     HPI: Cory Houston is a 67 y.o. male who presents for colonoscopy.  Medical history as below.  Tolerated the prep.  No recent chest pain or shortness of breath.  No abdominal pain today.  Past Medical History:  Diagnosis Date   Allergy    Arthritis    Diabetes mellitus without complication (HCC)    GERD (gastroesophageal reflux disease)    Hypertension    Hypertriglyceridemia    Sleep apnea     Past Surgical History:  Procedure Laterality Date   KNEE SURGERY Right 2004   VASECTOMY      Prior to Admission medications   Medication Sig Start Date End Date Taking? Authorizing Provider  amLODipine (NORVASC) 10 MG tablet TAKE 1 TABLET BY MOUTH EVERY DAY 08/14/22  Yes [name], FNP  Cholecalciferol (VITAMIN D-3) 5000 units TABS Take 1 tablet by mouth every other day.    Yes [provider]  empagliflozin (JARDIANCE) 10 MG TABS tablet TAKE 1 TABLET BY MOUTH EVERY DAY BEFORE BREAKFAST 11/20/22  Yes Michela Pitcher, NP  ezetimibe (ZETIA) 10 MG tablet Take 1 tablet (10 mg total) by mouth daily. 12/16/22  Yes Agbor-Etang, Aaron Edelman, MD  fenofibrate (TRICOR) 145 MG tablet Take 1 tablet (145 mg total) by mouth daily. 12/16/22  Yes Agbor-Etang, Aaron Edelman, MD  icosapent Ethyl (VASCEPA) 1 g capsule Take 2 capsules (2 g total) by mouth 2 (two) times daily. 12/16/22  Yes Agbata, Tochukwu, MD  loratadine (CLARITIN) 10 MG tablet TAKE 1 TABLET BY MOUTH EVERY DAY 05/06/22  Yes Ratcliffe, [name], PA-C   metFORMIN (GLUCOPHAGE-XR) 500 MG 24 hr tablet Take 2 tablets (1,000 mg total) by mouth 2 (two) times daily. 11/20/22  Yes Michela Pitcher, NP  metoprolol succinate (TOPROL XL) 25 MG 24 hr tablet Take 1 tablet (25 mg total) by mouth daily. 11/19/22  Yes [name], MD  Multiple Vitamin (MULTIVITAMIN) tablet Take 1 tablet by mouth daily.   Yes [provider]  omeprazole (PRILOSEC) 20 MG capsule TAKE 1 CAPSULE BY MOUTH EVERY DAY 11/23/20  Yes [name], FNP  ramipril (ALTACE) 5 MG capsule Take 1 capsule (5 mg total) by mouth daily. 12/18/22  Yes Michela Pitcher, NP  rosuvastatin (CRESTOR) 40 MG tablet Take 1 tablet (40 mg total) by mouth daily. 11/25/22  Yes Agbor-Etang, Aaron Edelman, MD  tirzepatide [facility]) 5 MG/0.5ML Pen Inject 5 mg into the skin once a week. 12/17/22  Yes Michela Pitcher, NP  triamcinolone cream (KENALOG) 0.1 % Apply 1 Application topically 2 (two) times daily. To affected area. Patient not taking: Reported on 12/20/2022 06/04/22   [name], PA-C    Current Outpatient Medications  Medication Sig Dispense Refill   amLODipine (NORVASC) 10 MG tablet TAKE 1 TABLET BY MOUTH EVERY DAY 90 tablet 1   Cholecalciferol (VITAMIN D-3) 5000 units TABS Take 1 tablet by mouth every other day.      empagliflozin (JARDIANCE) 10 MG TABS tablet TAKE 1 TABLET BY MOUTH EVERY DAY BEFORE  BREAKFAST 90 tablet 1   ezetimibe (ZETIA) 10 MG tablet Take 1 tablet (10 mg total) by mouth daily. 30 tablet 9   fenofibrate (TRICOR) 145 MG tablet Take 1 tablet (145 mg total) by mouth daily. 30 tablet 9   icosapent Ethyl (VASCEPA) 1 g capsule Take 2 capsules (2 g total) by mouth 2 (two) times daily. 120 capsule 9   loratadine (CLARITIN) 10 MG tablet TAKE 1 TABLET BY MOUTH EVERY DAY 90 tablet 3   metFORMIN (GLUCOPHAGE-XR) 500 MG 24 hr tablet Take 2 tablets (1,000 mg total) by mouth 2 (two) times daily. 360 tablet 1   metoprolol succinate (TOPROL XL) 25 MG 24 hr tablet Take 1 tablet (25 mg total) by  mouth daily. 90 tablet 3   Multiple Vitamin (MULTIVITAMIN) tablet Take 1 tablet by mouth daily.     omeprazole (PRILOSEC) 20 MG capsule TAKE 1 CAPSULE BY MOUTH EVERY DAY 90 capsule 1   ramipril (ALTACE) 5 MG capsule Take 1 capsule (5 mg total) by mouth daily. 90 capsule 1   rosuvastatin (CRESTOR) 40 MG tablet Take 1 tablet (40 mg total) by mouth daily. 30 tablet 11   tirzepatide (MOUNJARO) 5 MG/0.5ML Pen Inject 5 mg into the skin once a week. 6 mL 0   triamcinolone cream (KENALOG) 0.1 % Apply 1 Application topically 2 (two) times daily. To affected area. (Patient not taking: Reported on 12/20/2022) 30 g 0   Current Facility-Administered Medications  Medication Dose Route Frequency Provider Last Rate Last Admin   0.9 %  sodium chloride infusion  500 mL Intravenous Once Artavia Jeanlouis, Lajuan Lines, MD        Allergies as of 01/10/2023 - Review Complete 01/10/2023  Allergen Reaction Noted   Codeine Anaphylaxis 01/31/2017   Pholcodine Anaphylaxis 12/20/2022    Family History  Problem Relation Age of Onset   Diabetes Mother    Obesity Mother    Hypertension Mother    Heart disease Mother    Congestive Heart Failure Mother    Hypercholesterolemia Mother    Diabetes Father    Congestive Heart Failure Father         cardiopulmonary edema.- Father 77 deceased from complications.    Diverticulitis Father    Hypertension Father    Hypercholesterolemia Father    Hearing loss Father    Cataracts Sister    Obesity Brother    Hypertension Brother    Hypertension Paternal Aunt    Hypertension Paternal Uncle    Heart attack Maternal Grandmother    Lung cancer Maternal Grandmother    Stroke Paternal Grandmother    Stroke Paternal Grandfather    Heart murmur Son    Colon cancer Neg Hx    Colon polyps Neg Hx    Esophageal cancer Neg Hx    Rectal cancer Neg Hx    Stomach cancer Neg Hx     Social History   Socioeconomic History   Marital status: Married    Spouse name: Shanette   Number of  children: 2   Years of education: Not on file   Highest education level: Not on file  Occupational History   Not on file  Tobacco Use   Smoking status: Never   Smokeless tobacco: Never  Vaping Use   Vaping Use: Never used  Substance and Sexual Activity   Alcohol use: Yes    Comment: rarely 1 per month   Drug use: No   Sexual activity: Yes    Birth control/protection: Surgical  Other Topics Concern   Not on file  Social History Narrative   4 step children    Widowed   Son (90) [place] (38) daughter       Fulltime: Insurance underwriter       Hobbies: model Museum/gallery curator   Social Determinants of Radio broadcast assistant Strain: Not on Art therapist Insecurity: Not on file  Transportation Needs: Not on file  Physical Activity: Not on file  Stress: Not on file  Social Connections: Not on file  Intimate Partner Violence: Not on file    Physical Exam: Vital signs in last 24 hours: '@BP'$  (!) 150/62   Pulse 70   Temp 97.8 F (36.6 C)   Ht '5\' 10"'$  (1.778 m)   Wt 225 lb (102.1 kg)   SpO2 97%   BMI 32.28 kg/m  GEN: NAD EYE: Sclerae anicteric ENT: MMM CV: Non-tachycardic Pulm: CTA b/l GI: Soft, NT/ND NEURO:  Alert & Oriented x 3   Zenovia Jarred, MD [facility]  01/10/2023 8:00 AM

## 2023-01-10 NOTE — Progress Notes (Signed)
Sedate, gd SR, tolerated procedure well, VSS, report to RN 

## 2023-01-10 NOTE — Progress Notes (Signed)
Called to room to assist during endoscopic procedure.  Patient ID and intended procedure confirmed with present staff. Received instructions for my participation in the procedure from the performing physician.  

## 2023-01-10 NOTE — Op Note (Signed)
[facility] Patient Name: Cory Houston Procedure Date: 01/10/2023 7:15 AM MRN: [id number] Endoscopist: Jerene Bears , MD, [id number] Age: 67 Referring MD:  Date of Birth: 12-26-1955 Gender: Male Account #: [id number] Procedure:                Colonoscopy Indications:              Screening for colorectal malignant neoplasm, This                            is the patient's first colonoscopy Medicines:                Monitored Anesthesia Care Procedure:                Pre-Anesthesia Assessment:                           - Prior to the procedure, a History and Physical                            was performed, and patient medications and                            allergies were reviewed. The patient's tolerance of                            previous anesthesia was also reviewed. The risks                            and benefits of the procedure and the sedation                            options and risks were discussed with the patient.                            All questions were answered, and informed consent                            was obtained. Prior Anticoagulants: The patient has                            taken no anticoagulant or antiplatelet agents. ASA                            Grade Assessment: II - A patient with mild systemic                            disease. After reviewing the risks and benefits,                            the patient was deemed in satisfactory condition to                            undergo the procedure.  After obtaining informed consent, the colonoscope                            was passed under direct vision. Throughout the                            procedure, the patient's blood pressure, pulse, and                            oxygen saturations were monitored continuously. The                            Olympus CF-HQ190L [id number]) Colonoscope was                            introduced through the anus and  advanced to the                            cecum, identified by appendiceal orifice and                            ileocecal valve. The colonoscopy was performed                            without difficulty. The patient tolerated the                            procedure well. The quality of the bowel                            preparation was excellent. The ileocecal valve,                            appendiceal orifice, and rectum were photographed. Scope In: 8:07:51 AM Scope Out: 8:26:52 AM Scope Withdrawal Time: 0 hours 16 minutes 12 seconds  Total Procedure Duration: 0 hours 19 minutes 1 second  Findings:                 The digital rectal exam was normal.                           A 5 mm polyp was found in the cecum. The polyp was                            sessile. The polyp was removed with a cold snare.                            Resection and retrieval were complete.                           A 5 mm polyp was found in the ascending colon. The                            polyp was sessile. The polyp  was removed with a                            cold snare. Resection and retrieval were complete.                           Three sessile polyps were found in the descending                            colon. The polyps were 3 to 6 mm in size. These                            polyps were removed with a cold snare. Resection                            and retrieval were complete.                           A 7 mm polyp was found in the rectum. The polyp was                            semi-pedunculated. The polyp was removed with a                            cold snare. Resection and retrieval were complete.                           Multiple large-mouthed, medium-mouthed and                            small-mouthed diverticula were found in the sigmoid                            colon and descending colon.                           External hemorrhoids were found during                             retroflexion. The hemorrhoids were small. Complications:            No immediate complications. Estimated Blood Loss:     Estimated blood loss was minimal. Impression:               - One 5 mm polyp in the cecum, removed with a cold                            snare. Resected and retrieved.                           - One 5 mm polyp in the ascending colon, removed                            with a cold snare. Resected and retrieved.                           -  Three 3 to 6 mm polyps in the descending colon,                            removed with a cold snare. Resected and retrieved.                           - One 7 mm polyp in the rectum, removed with a cold                            snare. Resected and retrieved.                           - Moderate diverticulosis in the sigmoid colon and                            in the descending colon.                           - Small external hemorrhoids. Recommendation:           - Patient has a contact number available for                            emergencies. The signs and symptoms of potential                            delayed complications were discussed with the                            patient. Return to normal activities tomorrow.                            Written discharge instructions were provided to the                            patient.                           - Resume previous diet.                           - Continue present medications.                           - Await pathology results.                           - Repeat colonoscopy is recommended for                            surveillance. The colonoscopy date will be                            determined after pathology results from today's  exam become available for review. Jerene Bears, MD 01/10/2023 8:28:16 AM This report has been signed electronically.

## 2023-01-10 NOTE — Progress Notes (Signed)
Pt's states no medical or surgical changes since previsit or office visit. 

## 2023-01-13 ENCOUNTER — Telehealth: Payer: Self-pay

## 2023-01-13 NOTE — Telephone Encounter (Signed)
Attempted f/u call. No answer, left VM. 

## 2023-01-14 ENCOUNTER — Encounter: Payer: Self-pay | Admitting: Internal Medicine

## 2023-01-17 ENCOUNTER — Ambulatory Visit: Payer: BC Managed Care – PPO | Admitting: Nurse Practitioner

## 2023-01-17 ENCOUNTER — Encounter: Payer: Self-pay | Admitting: Nurse Practitioner

## 2023-01-17 VITALS — BP 102/70 | HR 75 | Temp 97.8°F | Resp 16 | Ht 70.0 in | Wt 213.1 lb

## 2023-01-17 DIAGNOSIS — I10 Essential (primary) hypertension: Secondary | ICD-10-CM

## 2023-01-17 DIAGNOSIS — E1165 Type 2 diabetes mellitus with hyperglycemia: Secondary | ICD-10-CM

## 2023-01-17 DIAGNOSIS — Z23 Encounter for immunization: Secondary | ICD-10-CM

## 2023-01-17 LAB — POCT GLYCOSYLATED HEMOGLOBIN (HGB A1C): Hemoglobin A1C: 7.1 % — AB (ref 4.0–5.6)

## 2023-01-17 NOTE — Progress Notes (Signed)
Established Patient Office Visit  Subjective   Patient ID: Cory Houston, male    DOB: 07/12/1956  Age: 67 y.o. MRN: [id number]  Chief Complaint  Patient presents with   Diabetes      DM2: does check glucose at home. He is on metformin jardiance and mounjaro. States that he has been checking them every morning 120-130 in the am  States weeks 110-115. Exercise with work is walking a lot   HTN: he is on amlodipine and ramipril. Does have a blood pressure cuff at home.  States he does check it infrequently.  He denies any lightheadedness or dizziness currently.  Tolerating medications well    Review of Systems  Constitutional:  Negative for chills and fever.  Respiratory:  Negative for shortness of breath.   Cardiovascular:  Negative for chest pain.  Gastrointestinal:  Negative for abdominal pain, constipation, nausea and vomiting.      Objective:     BP 102/70   Pulse 75   Temp 97.8 F (36.6 C)   Resp 16   Ht '5\' 10"'$  (1.778 m)   Wt 213 lb 2 oz (96.7 kg)   SpO2 97%   BMI 30.58 kg/m  BP Readings from Last 3 Encounters:  01/17/23 102/70  01/10/23 114/67  11/08/22 120/70   Wt Readings from Last 3 Encounters:  01/17/23 213 lb 2 oz (96.7 kg)  01/10/23 225 lb (102.1 kg)  12/20/22 225 lb (102.1 kg)      Physical Exam Vitals and nursing note reviewed.  Constitutional:      Appearance: Normal appearance.  Cardiovascular:     Rate and Rhythm: Normal rate and regular rhythm.     Heart sounds: Normal heart sounds.  Pulmonary:     Effort: Pulmonary effort is normal.     Breath sounds: Normal breath sounds.  Abdominal:     General: Bowel sounds are normal. There is no distension.     Palpations: There is no mass.     Tenderness: There is no abdominal tenderness.     Hernia: No hernia is present.  Neurological:     Mental Status: He is alert.      Results for orders placed or performed in visit on 01/17/23  POCT glycosylated hemoglobin (Hb A1C)  Result  Value Ref Range   Hemoglobin A1C 7.1 (A) 4.0 - 5.6 %   HbA1c POC (<> result, manual entry)     HbA1c, POC (prediabetic range)     HbA1c, POC (controlled diabetic range)        The ASCVD Risk score (Arnett DK, et al., 2019) failed to calculate for the following reasons:   The valid total cholesterol range is 130 to 320 mg/dL    Assessment & Plan:   Problem List Items Addressed This Visit       Cardiovascular and Mediastinum   Hypertension    Patient currently maintained on amlodipine 10 mg and ramipril 5 mg.  Patient has lost 12 pounds blood pressure is trending down.  He denies any lightheadedness or dizziness.  He can check his blood pressure at home encouraged him to do so on occasion if he gets lightheaded dizziness he will reach out to me and we will put that the amlodipine in half.  Continue medications as prescribed currently        Endocrine   Uncontrolled diabetes mellitus with hyperglycemia ([place]) - Primary    Patient currently maintained on metformin, Jardiance, Mounjaro.  He has been on [employer]  5 mg for 1 month.  A1c reduction to 7.1%.  Will continue medications as prescribed currently continue checking glucose daily no hypoglycemic events currently.  Follow-up 3 months sooner if needed we will check labs next office visit      Relevant Orders   POCT glycosylated hemoglobin (Hb A1C) (Completed)   Other Visit Diagnoses     Need for shingles vaccine       Relevant Orders   Zoster Recombinant (Shingrix ) (Completed)       Return in about 3 months (around 04/19/2023) for DM recheck and labs.    Romilda Garret, NP

## 2023-01-17 NOTE — Assessment & Plan Note (Signed)
Patient currently maintained on amlodipine 10 mg and ramipril 5 mg.  Patient has lost 12 pounds blood pressure is trending down.  He denies any lightheadedness or dizziness.  He can check his blood pressure at home encouraged him to do so on occasion if he gets lightheaded dizziness he will reach out to me and we will put that the amlodipine in half.  Continue medications as prescribed currently

## 2023-01-17 NOTE — Assessment & Plan Note (Signed)
Patient currently maintained on metformin, Jardiance, Mounjaro.  He has been on Mounjaro 5 mg for 1 month.  A1c reduction to 7.1%.  Will continue medications as prescribed currently continue checking glucose daily no hypoglycemic events currently.  Follow-up 3 months sooner if needed we will check labs next office visit

## 2023-01-17 NOTE — Patient Instructions (Signed)
Nice to see you today You have lost 12 pounds and your A1C is 7.1! That is great keep up the good work  We will keep your medications the same today Follow up with me in 3 months, sooner if you need me

## 2023-02-16 IMAGING — CR DG RIBS 2V*L*
1 series · 4 of 4 positions shown · non-contrast
Comparison: None.

CLINICAL DATA: Left-sided chest tenderness.

EXAM:
LEFT RIBS - 2 VIEW

[Series 1: dg ribs unilateral left · 0.14mm/px · 4 of 4 slices shown]
[im 1/4]
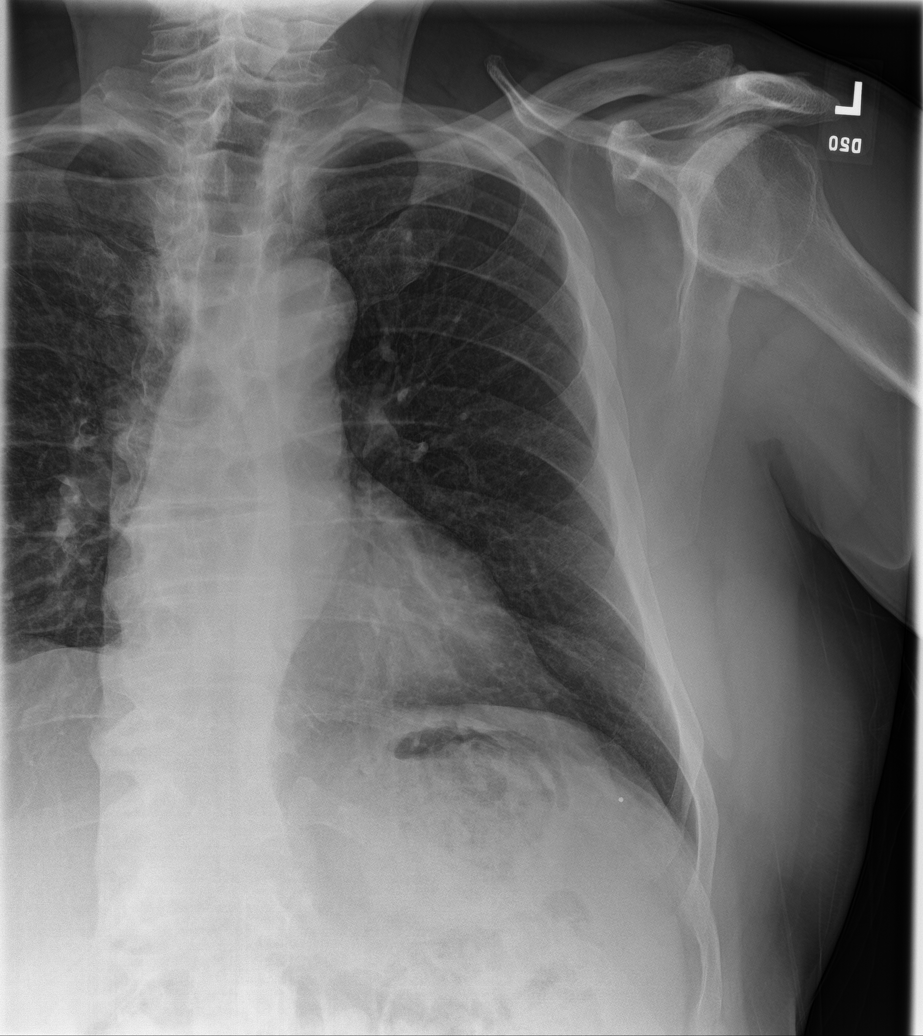
[im 2/4]
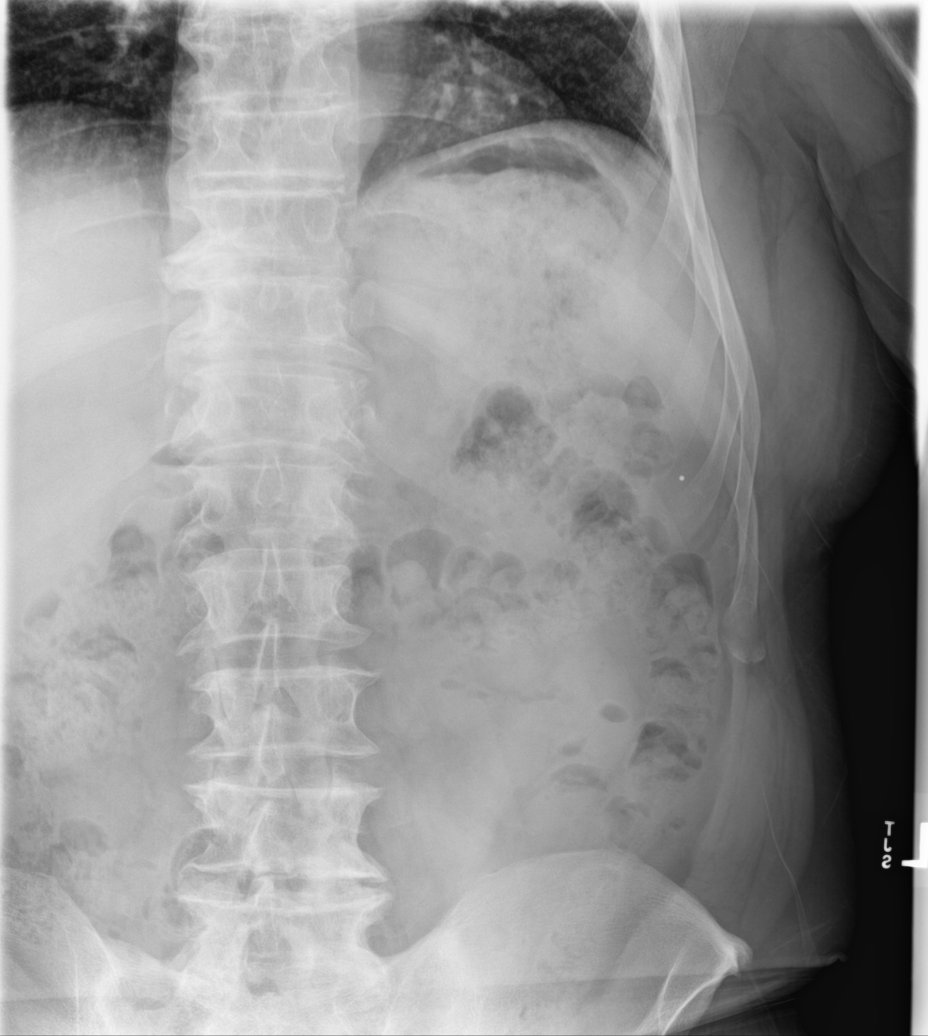
[im 3/4]
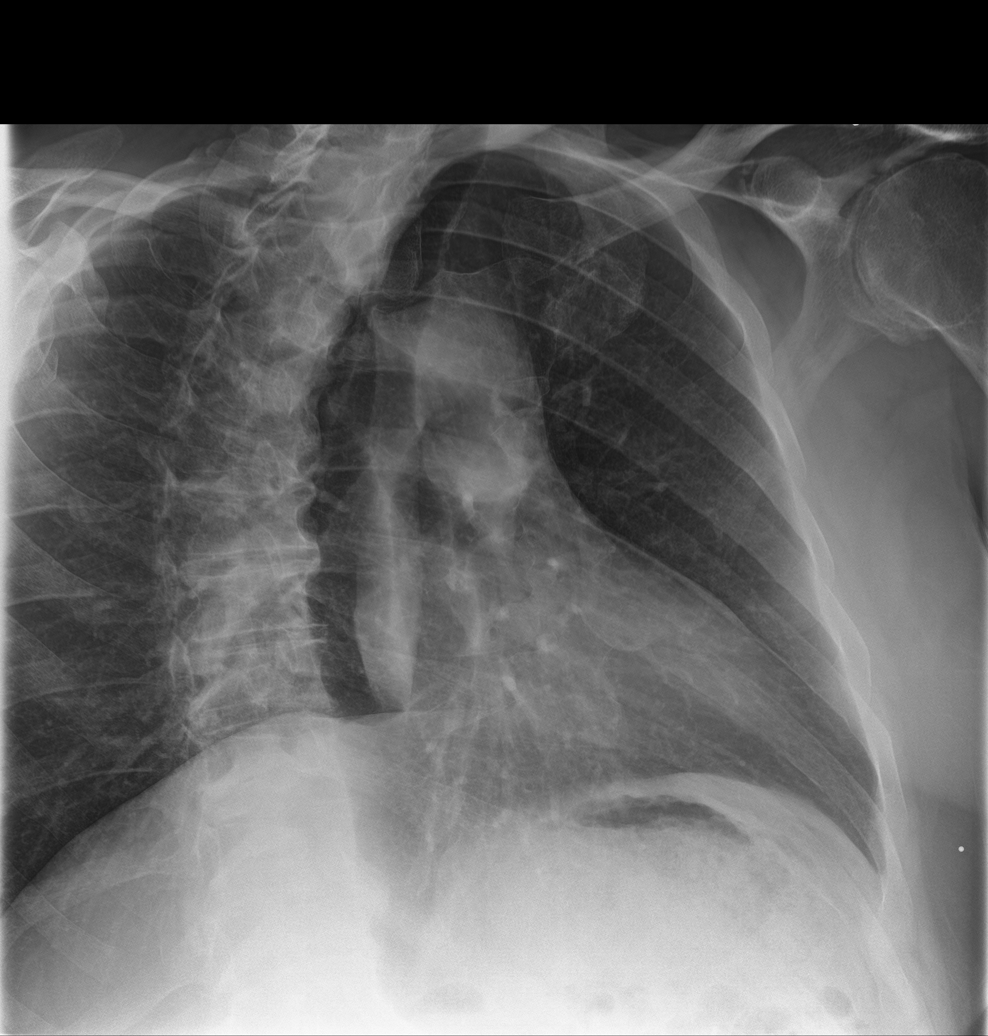
[im 4/4]
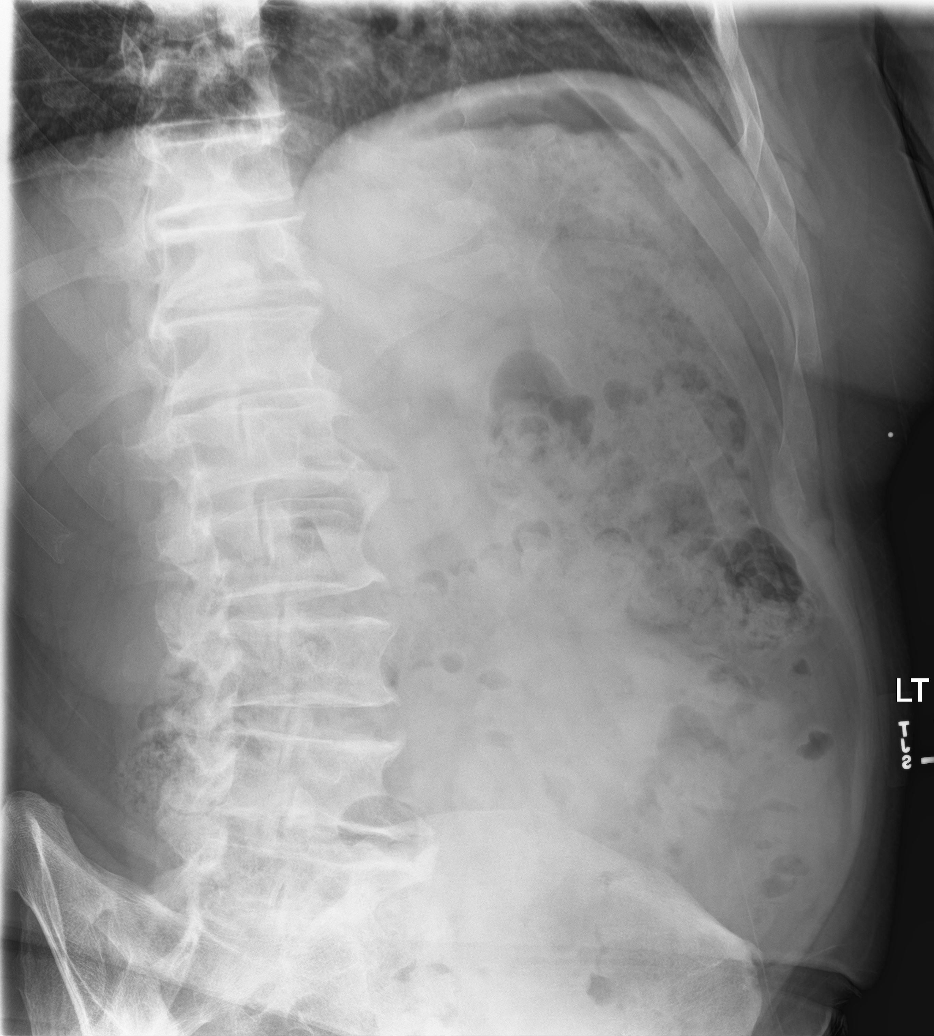

[4 of 4 positions shown; findings below may reference images not displayed]

FINDINGS: No fracture or other bone lesions are seen involving the ribs.
IMPRESSION: Negative.

## 2023-02-25 ENCOUNTER — Ambulatory Visit (HOSPITAL_BASED_OUTPATIENT_CLINIC_OR_DEPARTMENT_OTHER): Payer: BC Managed Care – PPO | Admitting: Internal Medicine

## 2023-02-25 ENCOUNTER — Encounter (HOSPITAL_BASED_OUTPATIENT_CLINIC_OR_DEPARTMENT_OTHER): Payer: Self-pay | Admitting: Internal Medicine

## 2023-02-25 VITALS — BP 124/72 | HR 76 | Ht 70.0 in | Wt 217.2 lb

## 2023-02-25 DIAGNOSIS — E119 Type 2 diabetes mellitus without complications: Secondary | ICD-10-CM | POA: Diagnosis not present

## 2023-02-25 DIAGNOSIS — E783 Hyperchylomicronemia: Secondary | ICD-10-CM

## 2023-02-25 DIAGNOSIS — E781 Pure hyperglyceridemia: Secondary | ICD-10-CM

## 2023-02-25 NOTE — Patient Instructions (Signed)
Medication Instructions:  NO CHANGES  *If you need a refill on your cardiac medications before your next appointment, please call your pharmacy*   Lab Work: FASTING lab work to check cholesterol in 6 months ** prior to next visit  If you have labs (blood work) drawn today and your tests are completely normal, you will receive your results only by: MyChart Message (if you have MyChart) OR A paper copy in the mail If you have any lab test that is abnormal or we need to change your treatment, we will call you to review the results.   Follow-Up: At [facility], you and your health needs are our priority.  As part of our continuing mission to provide you with exceptional heart care, we have created designated Provider Care Teams.  These Care Teams include your primary Cardiologist (physician) and Advanced Practice Providers (APPs -  Physician Assistants and Nurse Practitioners) who all work together to provide you with the care you need, when you need it.  We recommend signing up for the patient portal called "MyChart".  Sign up information is provided on this After Visit Summary.  MyChart is used to connect with patients for Virtual Visits (Telemedicine).  Patients are able to view lab/test results, encounter notes, upcoming appointments, etc.  Non-urgent messages can be sent to your provider as well.   To learn more about what you can do with MyChart, go to [email or web address].    Your next appointment:    6 months with Dr. Rennis Golden -- call in mid-May or early June for an October appointment

## 2023-02-25 NOTE — Progress Notes (Signed)
OFFICE NOTE  Chief Complaint:  Follow-up dyslipidemia  Primary Care Physician: Eden Emms, NP  HPI:  Cory Houston is a 67 y.o. male with a past medial history significant for a longstanding history of high cholesterol including high triglycerides.  There is a family history of heart disease as well in his father who had coronary artery disease and ultimately had an LVAD and died several years ago.  He has been on combination therapy for high triglycerides but despite this continues to have elevations.  His last lipid profile this past summer, demonstrated total cholesterol 142, triglycerides 989 and HDL 17.  His last calculated LDL was a year ago at 104 however triglycerides were down to 349 at the time.  He does have a history of diabetes but recently has had better control with his hemoglobin A1c in the 7 to 8% range.  He tries to avoid sweets and particularly reduce his carbohydrates but may not necessarily keep fats low.  He was recently started on a statin in addition to Vascepa, fenofibrate and ezetimibe.  He has not had lab work since then but has been on the statin at least for 3 months.  He denies any side effects with this.  He has no known cardiovascular disease.   02/25/2023  Cory Houston returns today for follow-up.  He had repeat lipids in December.  His cholesterol is significantly lower with total 96, triglycerides 396, HDL 23 and LDL of 18.  The little leg was negative at 60.1 nmol/L.  His LDL particle number was 494 and his small LDL particle was 399.  This is significantly improved from previous labs.  PMHx:  Past Medical History:  Diagnosis Date   Allergy    Arthritis    Diabetes mellitus without complication    GERD (gastroesophageal reflux disease)    Hypertension    Hypertriglyceridemia    Sleep apnea     Past Surgical History:  Procedure Laterality Date   KNEE SURGERY Right 2004   VASECTOMY      FAMHx:  Family History  Problem Relation Age of Onset    Diabetes Mother    Obesity Mother    Hypertension Mother    Heart disease Mother    Congestive Heart Failure Mother    Hypercholesterolemia Mother    Diabetes Father    Congestive Heart Failure Father         cardiopulmonary edema.- Father 27 deceased from complications.    Diverticulitis Father    Hypertension Father    Hypercholesterolemia Father    Hearing loss Father    Cataracts Sister    Obesity Brother    Hypertension Brother    Hypertension Paternal Aunt    Hypertension Paternal Uncle    Heart attack Maternal Grandmother    Lung cancer Maternal Grandmother    Stroke Paternal Grandmother    Stroke Paternal Grandfather    Heart murmur Son    Colon cancer Neg Hx    Colon polyps Neg Hx    Esophageal cancer Neg Hx    Rectal cancer Neg Hx    Stomach cancer Neg Hx     SOCHx:   reports that he has never smoked. He has never used smokeless tobacco. He reports current alcohol use. He reports that he does not use drugs.  ALLERGIES:  Allergies  Allergen Reactions   Codeine Anaphylaxis   Pholcodine Anaphylaxis    ROS: Pertinent items noted in HPI and remainder of comprehensive ROS otherwise negative.  HOME MEDS: Current Outpatient Medications on File Prior to Visit  Medication Sig Dispense Refill   amLODipine (NORVASC) 10 MG tablet TAKE 1 TABLET BY MOUTH EVERY DAY 90 tablet 1   Cholecalciferol (VITAMIN D-3) 5000 units TABS Take 1 tablet by mouth every other day.      empagliflozin (JARDIANCE) 10 MG TABS tablet TAKE 1 TABLET BY MOUTH EVERY DAY BEFORE BREAKFAST 90 tablet 1   ezetimibe (ZETIA) 10 MG tablet Take 1 tablet (10 mg total) by mouth daily. 30 tablet 9   fenofibrate (TRICOR) 145 MG tablet Take 1 tablet (145 mg total) by mouth daily. 30 tablet 9   icosapent Ethyl (VASCEPA) 1 g capsule Take 2 capsules (2 g total) by mouth 2 (two) times daily. 120 capsule 9   loratadine (CLARITIN) 10 MG tablet TAKE 1 TABLET BY MOUTH EVERY DAY 90 tablet 3   metFORMIN (GLUCOPHAGE-XR)  500 MG 24 hr tablet Take 2 tablets (1,000 mg total) by mouth 2 (two) times daily. 360 tablet 1   metoprolol succinate (TOPROL XL) 25 MG 24 hr tablet Take 1 tablet (25 mg total) by mouth daily. 90 tablet 3   Multiple Vitamin (MULTIVITAMIN) tablet Take 1 tablet by mouth daily.     omeprazole (PRILOSEC) 20 MG capsule TAKE 1 CAPSULE BY MOUTH EVERY DAY 90 capsule 1   ramipril (ALTACE) 5 MG capsule Take 1 capsule (5 mg total) by mouth daily. 90 capsule 1   rosuvastatin (CRESTOR) 40 MG tablet Take 1 tablet (40 mg total) by mouth daily. 30 tablet 11   tirzepatide (MOUNJARO) 5 MG/0.5ML Pen Inject 5 mg into the skin once a week. 6 mL 0   triamcinolone cream (KENALOG) 0.1 % Apply 1 Application topically 2 (two) times daily. To affected area. 30 g 0   No current facility-administered medications on file prior to visit.    LABS/IMAGING: No results found for this or any previous visit (from the past 48 hour(s)). No results found.  LIPID PANEL:    Component Value Date/Time   CHOL 96 (L) 11/05/2022 1117   TRIG 396 (H) 11/05/2022 1117   HDL 23 (L) 11/05/2022 1117   CHOLHDL 8.4 (H) 05/29/2022 0809   CHOLHDL 6.2 08/15/2021 0823   VLDL NOT CALCULATED 08/15/2021 0823   LDLCALC 18 11/05/2022 1117   LDLDIRECT 65.7 08/15/2021 0823     WEIGHTS: Wt Readings from Last 3 Encounters:  02/25/23 217 lb 3.2 oz (98.5 kg)  01/17/23 213 lb 2 oz (96.7 kg)  01/10/23 225 lb (102.1 kg)    VITALS: BP 124/72   Pulse 76   Ht 5\' 10"  (1.778 m)   Wt 217 lb 3.2 oz (98.5 kg)   SpO2 96%   BMI 31.16 kg/m   EXAM: Deferred  EKG: Deferred  ASSESSMENT: Hyperchylomicronemia, 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   Cory Houston has had significant improvement in his triglycerides now in the 300s.  He is out of the range of danger with regards to pancreatitis.  His LDL cholesterol is improved significantly as well.  Overall he  feels well on the regimen.  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  I expect that he will be more physically active and his triglycerides will improve further.  Will plan repeat lipids in about 6 months and follow-up with me afterwards.  Chrystie NoseKenneth C. Yesica Kemler, MD, [facility]rest HospitalFACC, FACP  [place]  [facility]  Medical Director of the [facility]  [facility]ovascular Risk [facility] Diplomate of the American Board of Clinical Lipidology Attending Cardiologist  Direct Dial: [phone]  Fax: [phone]  Website:  [email or web address] 02/25/2023, 8:48 AM

## 2023-03-10 ENCOUNTER — Other Ambulatory Visit: Payer: Self-pay | Admitting: Nurse Practitioner

## 2023-03-10 DIAGNOSIS — E1165 Type 2 diabetes mellitus with hyperglycemia: Secondary | ICD-10-CM

## 2023-03-12 ENCOUNTER — Other Ambulatory Visit: Payer: Self-pay | Admitting: Nurse Practitioner

## 2023-03-12 DIAGNOSIS — E1165 Type 2 diabetes mellitus with hyperglycemia: Secondary | ICD-10-CM

## 2023-03-17 ENCOUNTER — Other Ambulatory Visit: Payer: Self-pay | Admitting: Nurse Practitioner

## 2023-03-17 DIAGNOSIS — E119 Type 2 diabetes mellitus without complications: Secondary | ICD-10-CM

## 2023-03-22 LAB — HM DIABETES EYE EXAM

## 2023-03-26 NOTE — Telephone Encounter (Signed)
Patient called in and stated that this medication is on back order. He stated that they had the 2.5 on hand but not the 5. He was wanting to know what to he should do. Please advise. Thank you!

## 2023-03-27 MED ORDER — TIRZEPATIDE 7.5 MG/0.5ML ~~LOC~~ SOAJ: 7.5000 mg | SUBCUTANEOUS | 0 refills | Status: DC

## 2023-03-27 NOTE — Telephone Encounter (Signed)
Please send 7.5 mg to [facility].

## 2023-03-27 NOTE — Addendum Note (Signed)
Addended by: Eden Emms on: 03/27/2023 01:47 PM   Modules accepted: Orders

## 2023-03-27 NOTE — Telephone Encounter (Signed)
He can either call other pharmacies and see if they have it in stock or if he is tolerating it we can go up to the next dose of 7.5mg . if he is not comfortable with that then we can go back to the 2.5mg 

## 2023-03-27 NOTE — Telephone Encounter (Signed)
Left message to return call to our office.  

## 2023-03-27 NOTE — Telephone Encounter (Signed)
Pt is ok with going up to 7.5

## 2023-04-02 ENCOUNTER — Telehealth: Payer: Self-pay | Admitting: Nurse Practitioner

## 2023-04-02 NOTE — Telephone Encounter (Signed)
Patient called in stating that he took the 7.5 of mounjaro on yesterday,he called in today stating that  he had a reaction to it of stomach pain,diarrhea and vomiting. He is wondering if it has to do with him not taking 5.0 due to it being out of stock and just jumping to 7.5? He would like to know should he continue or what would [place] suggest?

## 2023-04-02 NOTE — Telephone Encounter (Signed)
Called pt and he was on 2.5 MG back in December he did say he went to the 5 Mg in February. He stated that he took 7.5 yesterday.  He stated that he was off the 5 mg for 3 weeks.

## 2023-04-02 NOTE — Telephone Encounter (Signed)
My understanding was that he had been on the 5mg  but when he went to refill it was out. Has he done the 5mg  at all?

## 2023-04-03 NOTE — Telephone Encounter (Signed)
Called and informed pt of this information. He stated that he would continue on the 7.5.

## 2023-04-03 NOTE — Telephone Encounter (Signed)
Him having the period of being off the medication for the 3 weeks then going to the higher dose is what made him sick. He has injected it now so he should be fine. He may have a similar feeling next time he injects.

## 2023-04-21 ENCOUNTER — Encounter: Payer: Self-pay | Admitting: Nurse Practitioner

## 2023-04-21 ENCOUNTER — Ambulatory Visit: Payer: BC Managed Care – PPO | Admitting: Nurse Practitioner

## 2023-04-21 VITALS — BP 110/68 | HR 65 | Temp 97.4°F | Resp 16 | Ht 70.0 in | Wt 210.4 lb

## 2023-04-21 DIAGNOSIS — I10 Essential (primary) hypertension: Secondary | ICD-10-CM

## 2023-04-21 DIAGNOSIS — Z7984 Long term (current) use of oral hypoglycemic drugs: Secondary | ICD-10-CM | POA: Diagnosis not present

## 2023-04-21 DIAGNOSIS — E1165 Type 2 diabetes mellitus with hyperglycemia: Secondary | ICD-10-CM | POA: Diagnosis not present

## 2023-04-21 DIAGNOSIS — E119 Type 2 diabetes mellitus without complications: Secondary | ICD-10-CM

## 2023-04-21 DIAGNOSIS — Z7985 Long-term (current) use of injectable non-insulin antidiabetic drugs: Secondary | ICD-10-CM

## 2023-04-21 LAB — POCT GLYCOSYLATED HEMOGLOBIN (HGB A1C): Hemoglobin A1C: 6.1 % — AB (ref 4.0–5.6)

## 2023-04-21 NOTE — Assessment & Plan Note (Signed)
Patient's A1c is 6.1% today patient's diabetes well-controlled we will continue patient on Jardiance, metformin, Mounjaro.  Continue working on healthy lifestyle modifications also

## 2023-04-21 NOTE — Assessment & Plan Note (Signed)
Patient currently maintained on metoprolol 25 mg, ramipril 5 mg, amlodipine 10 mg.  Blood pressure well-controlled.  Patient's not having any symptoms of high blood tension.  We did have a discussion that this is a possibility with weight loss using Mounjaro injection.  Patient will continue taking blood pressure at home he experiences the symptoms he will reach out to the office will consider reducing his blood pressure medicine likely amlodipine.  Continue medications as prescribed currently

## 2023-04-21 NOTE — Progress Notes (Signed)
Established Patient Office Visit  Subjective   Patient ID: Cory Houston, male    DOB: Feb 16, 1956  Age: 67 y.o. MRN: [id number]  Chief Complaint  Patient presents with   Diabetes     DM2: patient is currently on  hardiance, metformin, and mounjaro. Checks his sugar 3 times a week . Average readings of 125-135 Hypoglycemia: none Hyperglycemia: none that he is aware of   Patient has lost weight since last office visit.  Patient has lost approximately 25 pounds since December 2023.   States that he has done 3 almost 4 weeks of Mounjaro 7.5 mg.  States he is tolerating medication well at this juncture.  States the first 2 weeks for breath in regards to the nausea and vomiting disease been out of medication due to a stock issue for several weeks.  HTN: Patient is currently maintained on amlodipine 10 mg, metoprolol 25 mg, ramipril 5 mg.  Patient is checking blood pressure at home about once a week.  States getting readings comparable to what we got in office.  Patient has not had any lightheadedness or dizziness per his report.   Review of Systems  Constitutional:  Negative for chills and fever.  Respiratory:  Negative for shortness of breath.   Cardiovascular:  Negative for chest pain.  Gastrointestinal:  Negative for abdominal pain, constipation, diarrhea, nausea and vomiting.  Neurological:  Negative for tingling and headaches.  Psychiatric/Behavioral:  Negative for hallucinations and suicidal ideas.       Objective:     BP 110/68   Pulse 65   Temp (!) 97.4 F (36.3 C) (Temporal)   Resp 16   Ht 5\' 10"  (1.778 m)   Wt 210 lb 6 oz (95.4 kg)   SpO2 98%   BMI 30.19 kg/m  BP Readings from Last 3 Encounters:  04/21/23 110/68  02/25/23 124/72  01/17/23 102/70   Wt Readings from Last 3 Encounters:  04/21/23 210 lb 6 oz (95.4 kg)  02/25/23 217 lb 3.2 oz (98.5 kg)  01/17/23 213 lb 2 oz (96.7 kg)      Physical Exam Vitals and nursing note reviewed.  Constitutional:       Appearance: Normal appearance.  Cardiovascular:     Rate and Rhythm: Normal rate and regular rhythm.     Heart sounds: Normal heart sounds.  Pulmonary:     Effort: Pulmonary effort is normal.     Breath sounds: Normal breath sounds.  Abdominal:     General: Bowel sounds are normal.  Musculoskeletal:     Right lower leg: No edema.     Left lower leg: No edema.  Neurological:     Mental Status: He is alert.      Results for orders placed or performed in visit on 04/21/23  POCT glycosylated hemoglobin (Hb A1C)  Result Value Ref Range   Hemoglobin A1C 6.1 (A) 4.0 - 5.6 %   HbA1c POC (<> result, manual entry)     HbA1c, POC (prediabetic range)     HbA1c, POC (controlled diabetic range)        The ASCVD Risk score (Arnett DK, et al., 2019) failed to calculate for the following reasons:   The valid total cholesterol range is 130 to 320 mg/dL    Assessment & Plan:   Problem List Items Addressed This Visit       Cardiovascular and Mediastinum   Hypertension    Patient currently maintained on metoprolol 25 mg, ramipril 5 mg, amlodipine 10  mg.  Blood pressure well-controlled.  Patient's not having any symptoms of high blood tension.  We did have a discussion that this is a possibility with weight loss using Mounjaro injection.  Patient will continue taking blood pressure at home he experiences the symptoms he will reach out to the office will consider reducing his blood pressure medicine likely amlodipine.  Continue medications as prescribed currently        Endocrine   Well controlled type 2 diabetes mellitus without evidence of diabetic end organ manifestation (HCC) - Primary    Patient's A1c is 6.1% today patient's diabetes well-controlled we will continue patient on Jardiance, metformin, Mounjaro.  Continue working on healthy lifestyle modifications also       Return in about 3 months (around 07/22/2023) for CPE and Labs, DM recheck.    Audria Nine, NP

## 2023-04-21 NOTE — Patient Instructions (Signed)
Nice to see you today Your A1C was 6.1% today. That is fantastic Continue to check your sugar and blood pressure at home See me in 3 months for you physical and a full panel of labs, sooner if you need me

## 2023-05-09 ENCOUNTER — Telehealth: Payer: Self-pay | Admitting: Nurse Practitioner

## 2023-05-09 DIAGNOSIS — I10 Essential (primary) hypertension: Secondary | ICD-10-CM

## 2023-05-09 MED ORDER — AMLODIPINE BESYLATE 10 MG PO TABS
10.0000 mg | ORAL_TABLET | Freq: Every day | ORAL | 1 refills | Status: DC
Start: 2023-05-09 — End: 2023-11-10

## 2023-05-09 NOTE — Telephone Encounter (Signed)
Refill provided

## 2023-05-09 NOTE — Addendum Note (Signed)
Addended by: Eden Emms on: 05/09/2023 02:18 PM   Modules accepted: Orders

## 2023-05-09 NOTE — Telephone Encounter (Signed)
Prescription Request  05/09/2023  LOV: 04/21/2023  What is the name of the medication or equipment? amLODipine (NORVASC) 10 MG tablet   Have you contacted your pharmacy to request a refill? Yes   Which pharmacy would you like this sent to?  [id number] [place], [place] - [address] [place] [address] Phone: [phone] Fax: [phone]   Patient notified that their request is being sent to the clinical staff for review and that they should receive a response within 2 business days.   Please advise at [facility] [phone]

## 2023-05-22 ENCOUNTER — Other Ambulatory Visit: Payer: Self-pay | Admitting: Nurse Practitioner

## 2023-05-22 DIAGNOSIS — R7309 Other abnormal glucose: Secondary | ICD-10-CM

## 2023-05-23 NOTE — Telephone Encounter (Signed)
Medication: Jardiance 10 mg  Directions: Take 1 tablet po daily before breakfast  Last given: 11/20/22 Number refills: 1 Last o/v: 04/21/23 Follow up: 07/23/23 Labs: 04/21/23

## 2023-06-18 ENCOUNTER — Other Ambulatory Visit: Payer: Self-pay | Admitting: Nurse Practitioner

## 2023-06-18 DIAGNOSIS — I10 Essential (primary) hypertension: Secondary | ICD-10-CM

## 2023-06-18 NOTE — Telephone Encounter (Signed)
LAST APPOINTMENT DATE: 04/21/23   NEXT APPOINTMENT DATE: 07/23/2023   RAMIPRIL  LAST REFILL: 12/18/22  QTY: #90 1RF

## 2023-06-21 ENCOUNTER — Other Ambulatory Visit: Payer: Self-pay | Admitting: Nurse Practitioner

## 2023-06-21 DIAGNOSIS — E1165 Type 2 diabetes mellitus with hyperglycemia: Secondary | ICD-10-CM

## 2023-07-03 ENCOUNTER — Encounter (INDEPENDENT_AMBULATORY_CARE_PROVIDER_SITE_OTHER): Payer: Self-pay

## 2023-07-23 ENCOUNTER — Ambulatory Visit (INDEPENDENT_AMBULATORY_CARE_PROVIDER_SITE_OTHER): Payer: BC Managed Care – PPO | Admitting: Nurse Practitioner

## 2023-07-23 ENCOUNTER — Encounter: Payer: Self-pay | Admitting: Nurse Practitioner

## 2023-07-23 VITALS — BP 100/80 | HR 60 | Temp 97.5°F | Ht 69.05 in | Wt 206.2 lb

## 2023-07-23 DIAGNOSIS — E119 Type 2 diabetes mellitus without complications: Secondary | ICD-10-CM

## 2023-07-23 DIAGNOSIS — Z125 Encounter for screening for malignant neoplasm of prostate: Secondary | ICD-10-CM

## 2023-07-23 DIAGNOSIS — I10 Essential (primary) hypertension: Secondary | ICD-10-CM | POA: Diagnosis not present

## 2023-07-23 DIAGNOSIS — Z23 Encounter for immunization: Secondary | ICD-10-CM | POA: Diagnosis not present

## 2023-07-23 DIAGNOSIS — K219 Gastro-esophageal reflux disease without esophagitis: Secondary | ICD-10-CM

## 2023-07-23 DIAGNOSIS — Z7984 Long term (current) use of oral hypoglycemic drugs: Secondary | ICD-10-CM | POA: Diagnosis not present

## 2023-07-23 DIAGNOSIS — Z0000 Encounter for general adult medical examination without abnormal findings: Secondary | ICD-10-CM

## 2023-07-23 DIAGNOSIS — G4733 Obstructive sleep apnea (adult) (pediatric): Secondary | ICD-10-CM | POA: Diagnosis not present

## 2023-07-23 HISTORY — DX: Encounter for general adult medical examination without abnormal findings: Z00.00

## 2023-07-23 LAB — COMPREHENSIVE METABOLIC PANEL
ALT: 37 U/L (ref 0–53)
AST: 31 U/L (ref 0–37)
Albumin: 4.4 g/dL (ref 3.5–5.2)
Alkaline Phosphatase: 82 U/L (ref 39–117)
BUN: 20 mg/dL (ref 6–23)
CO2: 28 meq/L (ref 19–32)
Calcium: 9 mg/dL (ref 8.4–10.5)
Chloride: 106 meq/L (ref 96–112)
Creatinine, Ser: 0.99 mg/dL (ref 0.40–1.50)
GFR: 80.28 mL/min (ref >60.00)
Glucose, Bld: 95 mg/dL (ref 70–99)
Potassium: 4.4 meq/L (ref 3.5–5.1)
Sodium: 139 meq/L (ref 135–145)
Total Bilirubin: 0.6 mg/dL (ref 0.2–1.2)
Total Protein: 7 g/dL (ref 6.0–8.3)

## 2023-07-23 LAB — POCT GLYCOSYLATED HEMOGLOBIN (HGB A1C): Hemoglobin A1C: 6 % — AB (ref 4.0–5.6)

## 2023-07-23 LAB — LIPID PANEL
Cholesterol: 82 mg/dL (ref 0–200)
HDL: 23.6 mg/dL — ABNORMAL LOW (ref >39.00)
LDL Cholesterol: 26 mg/dL (ref 0–99)
NonHDL: 58.71
Total CHOL/HDL Ratio: 3
Triglycerides: 164 mg/dL — ABNORMAL HIGH (ref 0.0–149.0)
VLDL: 32.8 mg/dL (ref 0.0–40.0)

## 2023-07-23 LAB — CBC
HCT: 44.8 % (ref 39.0–52.0)
Hemoglobin: 14.8 g/dL (ref 13.0–17.0)
MCHC: 33.1 g/dL (ref 30.0–36.0)
MCV: 87.8 fl (ref 78.0–100.0)
Platelets: 162 10*3/uL (ref 150.0–400.0)
RBC: 5.11 Mil/uL (ref 4.22–5.81)
RDW: 14.8 % (ref 11.5–15.5)
WBC: 4.9 10*3/uL (ref 4.0–10.5)

## 2023-07-23 LAB — PSA: PSA: 0.2 ng/mL (ref 0.10–4.00)

## 2023-07-23 LAB — MICROALBUMIN / CREATININE URINE RATIO
Creatinine,U: 99.1 mg/dL
Microalb Creat Ratio: 0.7 mg/g (ref 0.0–30.0)
Microalb, Ur: <0.7 mg/dL (ref 0.0–1.9)

## 2023-07-23 LAB — TSH: TSH: 1.51 u[IU]/mL (ref 0.35–5.50)

## 2023-07-23 NOTE — Assessment & Plan Note (Signed)
Discussed age-appropriate immunizations and screening exams.  Did review patient's personal, surgical, social, family histories.  Patient is up-to-date with age-appropriate vaccinations.  Update flu vaccine today.  Patient is up-to-date on CRC screening.  PSA for prostate cancer screening today.  Patient was given information at discharge about preventative healthcare maintenance with anticipatory guidance.

## 2023-07-23 NOTE — Patient Instructions (Addendum)
Nice to see you today I want to see you in 6 months for an A1C recheck, sooner if you need me  We did update your flu vaccine today

## 2023-07-23 NOTE — Progress Notes (Signed)
Established Patient Office Visit  Subjective   Patient ID: Cory Houston, male    DOB: January 19, 1956  Age: 67 y.o. MRN: [id number]  Chief Complaint  Patient presents with   Annual Exam    Pt would like flu shot.    Medication Refill    Pt requests refill for Loratadine 10mg       DM2: Patient currently maintained on metformin 1000 mg twice daily, Jardiance 10 mg, tirzepatide 7.5 mg. States that he will check it weekly in the 120 range. States that the lowest was 111. No hypoglycemic episodes per patient report   HTN: Patient currently maintained on amlodipine 10 mg, metoprolol 25 mg, ramipril 5 mg. Tolerating medications well. No lightheadedness or dizziness   HLD: Patient is followed by Rondel Baton, MD lipid clinic.  Patient currently maintained on ezetimibe, fenofibrate, Vascepa, rosuvastatin.  GERD: Patient currently maintained on omeprazole 20 mg. States that he is taking it every other day and doing   OSA: states that he no longer uses since losing weight    for complete physical and follow up of chronic conditions.  Immunizations: -Tetanus: Completed in 2023 -Influenza: Completed today -Shingles: Completed Shingrix series -Pneumonia: Completed   Diet: Fair diet. States that he is doing 3 meals and diet coke. Very little water Exercise: With his employment walks 6-8 miles a day   Eye exam: Completes annually. Glasses.   Dental exam: Completes semi-annually    Colonoscopy: Completed in 01/10/2023, recall 3 years. Due 2027 Lung Cancer Screening: NA  PSA: Due  Sleep: states bed at 930-430. Feels rested. Unsure if he snores    Review of Systems  Constitutional:  Negative for chills and fever.  Respiratory:  Negative for shortness of breath.   Cardiovascular:  Negative for chest pain and leg swelling.  Gastrointestinal:  Negative for abdominal pain, blood in stool, constipation, diarrhea, nausea and vomiting.       Daily BM  Genitourinary:  Negative for  dysuria and hematuria.  Neurological:  Negative for tingling and headaches.  Psychiatric/Behavioral:  Negative for hallucinations and suicidal ideas.       Objective:     BP 100/80   Pulse 60   Temp (!) 97.5 F (36.4 C) (Temporal)   Ht 5' 9.05" (1.754 m)   Wt 206 lb 3.2 oz (93.5 kg)   SpO2 97%   BMI 30.41 kg/m  BP Readings from Last 3 Encounters:  07/23/23 100/80  04/21/23 110/68  02/25/23 124/72   Wt Readings from Last 3 Encounters:  07/23/23 206 lb 3.2 oz (93.5 kg)  04/21/23 210 lb 6 oz (95.4 kg)  02/25/23 217 lb 3.2 oz (98.5 kg)      Physical Exam Vitals and nursing note reviewed.  Constitutional:      Appearance: Normal appearance.  HENT:     Right Ear: Tympanic membrane, ear canal and external ear normal.     Left Ear: Tympanic membrane, ear canal and external ear normal.     Mouth/Throat:     Mouth: Mucous membranes are moist.     Pharynx: Oropharynx is clear.  Eyes:     Extraocular Movements: Extraocular movements intact.     Pupils: Pupils are equal, round, and reactive to light.  Cardiovascular:     Rate and Rhythm: Normal rate and regular rhythm.     Pulses: Normal pulses.     Heart sounds: Normal heart sounds.  Pulmonary:     Effort: Pulmonary effort is normal.  Breath sounds: Normal breath sounds.  Abdominal:     General: Bowel sounds are normal. There is no distension.     Palpations: There is no mass.     Tenderness: There is no abdominal tenderness.     Hernia: No hernia is present.  Genitourinary:    Comments: deferred Musculoskeletal:     Right lower leg: No edema.     Left lower leg: No edema.  Lymphadenopathy:     Cervical: No cervical adenopathy.  Skin:    General: Skin is warm.  Neurological:     General: No focal deficit present.     Mental Status: He is alert.     Deep Tendon Reflexes:     Reflex Scores:      Bicep reflexes are 2+ on the right side and 2+ on the left side.      Patellar reflexes are 2+ on the right side  and 2+ on the left side.    Comments: Bilateral upper and lower extremity strength 5/5  Psychiatric:        Mood and Affect: Mood normal.        Behavior: Behavior normal.        Thought Content: Thought content normal.        Judgment: Judgment normal.     Diabetic Foot Form - Detailed   Diabetic Foot Exam - detailed Is there swelling or and abnormal foot shape?: No Is there a claw toe deformity?: No Is there elevated skin temparature?: No Pulse Foot Exam completed.: Yes   Right posterior Tibialias: Present Left posterior Tibialias: Present   Right Dorsalis Pedis: Present Left Dorsalis Pedis: Present  Sensory Foot Exam Completed.: Yes Semmes-Weinstein Monofilament Test   Comments: All 10 sites sensation intact bilaterally        Results for orders placed or performed in visit on 07/23/23  POCT glycosylated hemoglobin (Hb A1C)  Result Value Ref Range   Hemoglobin A1C 6.0 (A) 4.0 - 5.6 %   HbA1c POC (<> result, manual entry)     HbA1c, POC (prediabetic range)     HbA1c, POC (controlled diabetic range)         The ASCVD Risk score (Arnett DK, et al., 2019) failed to calculate for the following reasons:   The valid total cholesterol range is 130 to 320 mg/dL    Assessment & Plan:   Problem List Items Addressed This Visit       Cardiovascular and Mediastinum   Hypertension    Continue metoprolol 25 mg, ramipril 5 mg, amlodipine 10 mg.  Blood pressure is well-controlled.  No symptoms of hypotension.  Did discuss if he starts having symptoms we will pull back on the amlodipine medication.  The patient stable at this juncture continue medications as prescribed      Relevant Orders   CBC   Comprehensive metabolic panel   TSH     Respiratory   OSA (obstructive sleep apnea)    Since weight loss patient has not been using CPAP        Digestive   Gastroesophageal reflux disease    Patient currently taking omeprazole 20 mg every other day.  Has improved with  weight loss stable continue        Endocrine   Well controlled type 2 diabetes mellitus without evidence of diabetic end organ manifestation [facility])    Patient currently maintained on Jardiance, tirzepatide, metformin.  A1c well-controlled today.  No episodes of hypoglycemia.  Continue medication as  prescribed      Relevant Orders   POCT glycosylated hemoglobin (Hb A1C) (Completed)   Lipid panel   Microalbumin / creatinine urine ratio     Other   Preventative health care - Primary    Discussed age-appropriate immunizations and screening exams.  Did review patient's personal, surgical, social, family histories.  Patient is up-to-date with age-appropriate vaccinations.  Update flu vaccine today.  Patient is up-to-date on CRC screening.  PSA for prostate cancer screening today.  Patient was given information at discharge about preventative healthcare maintenance with anticipatory guidance.      Relevant Orders   CBC   Comprehensive metabolic panel   TSH   Other Visit Diagnoses     Screening for prostate cancer       Relevant Orders   PSA   Need for influenza vaccination       Relevant Orders   Flu vaccine trivalent PF, 6mos and older(Flulaval,Afluria,Fluarix,Fluzone) (Completed)       Return in about 6 months (around 01/20/2024) for DM recheck, BP recheck.    Audria Nine, NP

## 2023-07-23 NOTE — Assessment & Plan Note (Signed)
Patient currently maintained on Jardiance, tirzepatide, metformin.  A1c well-controlled today.  No episodes of hypoglycemia.  Continue medication as prescribed

## 2023-07-23 NOTE — Assessment & Plan Note (Signed)
Patient currently taking omeprazole 20 mg every other day.  Has improved with weight loss stable continue

## 2023-07-23 NOTE — Assessment & Plan Note (Signed)
Continue metoprolol 25 mg, ramipril 5 mg, amlodipine 10 mg.  Blood pressure is well-controlled.  No symptoms of hypotension.  Did discuss if he starts having symptoms we will pull back on the amlodipine medication.  The patient stable at this juncture continue medications as prescribed

## 2023-07-23 NOTE — Assessment & Plan Note (Signed)
Since weight loss patient has not been using CPAP

## 2023-07-24 ENCOUNTER — Other Ambulatory Visit (HOSPITAL_COMMUNITY): Payer: Self-pay

## 2023-07-24 ENCOUNTER — Telehealth: Payer: Self-pay

## 2023-07-24 NOTE — Telephone Encounter (Signed)
Pharmacy Patient Advocate Encounter   Received notification from CoverMyMeds that prior authorization for VASCEPA is required/requested.   Insurance verification completed.   The patient is insured through [facility] .   Per test claim: PA required; PA submitted to [facility] via CoverMyMeds Key/confirmation #/EOC Key: [id number]     Status is pending

## 2023-07-24 NOTE — Telephone Encounter (Signed)
Pharmacy Patient Advocate Encounter  Received notification from [facility] that Prior Authorization for Vascepa has been APPROVED from 9.5.24 to 9.5.25. Ran test claim, Copay is $12.00 for 1 month & A DAW9. This test claim was processed through [facility]- copay amounts may vary at other pharmacies due to pharmacy/plan contracts, or as the patient moves through the different stages of their insurance plan.

## 2023-09-02 ENCOUNTER — Other Ambulatory Visit: Payer: Self-pay

## 2023-09-02 DIAGNOSIS — E781 Pure hyperglyceridemia: Secondary | ICD-10-CM

## 2023-09-02 DIAGNOSIS — E783 Hyperchylomicronemia: Secondary | ICD-10-CM

## 2023-09-06 LAB — APOLIPOPROTEIN B: Apolipoprotein B: 55 mg/dL (ref <90)

## 2023-09-06 LAB — NMR, LIPOPROFILE
Cholesterol, Total: 77 mg/dL — ABNORMAL LOW (ref 100–199)
HDL Particle Number: 27.3 umol/L — ABNORMAL LOW (ref >=30.5)
HDL-C: 24 mg/dL — ABNORMAL LOW (ref >39)
LDL Particle Number: 587 nmol/L (ref <1000)
LDL Size: 19.6 nmol — ABNORMAL LOW (ref >20.5)
LDL-C (NIH Calc): 21 mg/dL (ref 0–99)
LP-IR Score: 89 — ABNORMAL HIGH (ref <=45)
Small LDL Particle Number: 382 nmol/L (ref <=527)
Triglycerides: 202 mg/dL — ABNORMAL HIGH (ref 0–149)

## 2023-09-09 NOTE — Progress Notes (Deleted)
  Cardiology Office Note:  .   Date:  09/09/2023  ID:  Cory Houston, DOB 1956-04-18, MRN [id number] PCP: Cory Emms, NP  Cory Houston Providers Cardiologist:  Cory Odea, MD { Click to update primary MD,subspecialty MD or APP then REFRESH:1}   Patient Profile: .      PMH Dyslipidemia/Hypertriglyceridemia Family history heart disease Hypertension PVCs Diabetes  Referred to advanced lipid clinic and seen by Dr. Rennis Houston 10/16/22 for longstanding history of high cholesterol including high triglycerides.  He works as a Psychiatric nurse at [employer].  There is a family history of heart disease in his father who has coronary artery disease and ultimately had LVAD and died several years ago.  He has been on combination therapy for high triglycerides but despite this continues to have elevations.  At the time of the visit, total cholesterol 142, triglycerides 989, and HDL 17.  His last calculated LDL was a year prior at 104, however triglycerides were 349 at that time.  History of diabetes but A1c has been in the 7 to 8% range.  He tries to avoid sweets and particularly reduced his carbohydrates but does not necessarily follow a low-fat diet.  December 2023 lipids improved on statin, ezetimibe, vasectomy, and fenofibrate with total cholesterol 96, triglycerides 396, HDL 23, and LDL 18.  Lipoprotein a was negative.  His LDL particle number was 494 and small LDL particle was 399.        History of Present Illness: .   Cory Houston is a *** 67 y.o. male who is here today for follow-up of dyslipidemia. Lipid panel from 09/02/23 revealed LDL particle #587, LDL 21, HDL 24, triglycerides 202, total cholesterol 77.  ROS: ***       Studies Reviewed: .        *** Risk Assessment/Calculations:   {Does this patient have ATRIAL FIBRILLATION?:[phone]} No BP recorded.  {Refresh Note OR Click here to enter BP  :1}***       Physical Exam:   VS:  There were no vitals taken for this  visit.   Wt Readings from Last 3 Encounters:  07/23/23 206 lb 3.2 oz (93.5 kg)  04/21/23 210 lb 6 oz (95.4 kg)  02/25/23 217 lb 3.2 oz (98.5 kg)    GEN: Well nourished, well developed in no acute distress NECK: No JVD; No carotid bruits CARDIAC: ***RRR, no murmurs, rubs, gallops RESPIRATORY:  Clear to auscultation without rales, wheezing or rhonchi  ABDOMEN: Soft, non-tender, non-distended EXTREMITIES:  No edema; No deformity     ASSESSMENT AND PLAN: .    Dyslipidemia:    {Are you ordering a CV Procedure (e.g. stress test, cath, DCCV, TEE, etc)?   Press F2        :[id number]}  Dispo: ***  Signed, Eligha Bridegroom, NP-C

## 2023-09-10 ENCOUNTER — Encounter (HOSPITAL_BASED_OUTPATIENT_CLINIC_OR_DEPARTMENT_OTHER): Payer: BC Managed Care – PPO | Admitting: Nurse Practitioner

## 2023-09-10 ENCOUNTER — Encounter (HOSPITAL_BASED_OUTPATIENT_CLINIC_OR_DEPARTMENT_OTHER): Payer: Self-pay

## 2023-09-26 ENCOUNTER — Other Ambulatory Visit: Payer: Self-pay

## 2023-09-26 MED ORDER — FENOFIBRATE 145 MG PO TABS: 145.0000 mg | ORAL_TABLET | Freq: Every day | ORAL | 0 refills | Status: DC

## 2023-10-06 ENCOUNTER — Telehealth: Payer: Self-pay | Admitting: Nurse Practitioner

## 2023-10-06 NOTE — Telephone Encounter (Signed)
Patient is requesting a call back regarding a medication question, would not disclose which one it was in regards to. Please advise

## 2023-10-07 NOTE — Telephone Encounter (Signed)
Contacted pt regarding medication question. Pt stated that he would like to discuss getting a prescription for Cialis.  Advised to pt that he would need an OV.  Scheduled pt for office visit 11/20 to discuss prescription options and treatment.

## 2023-10-08 ENCOUNTER — Ambulatory Visit: Payer: BC Managed Care – PPO | Admitting: Nurse Practitioner

## 2023-10-08 VITALS — BP 110/70 | HR 69 | Temp 98.0°F | Ht 69.05 in | Wt 211.4 lb

## 2023-10-08 DIAGNOSIS — N529 Male erectile dysfunction, unspecified: Secondary | ICD-10-CM | POA: Diagnosis not present

## 2023-10-08 MED ORDER — TADALAFIL 10 MG PO TABS
10.0000 mg | ORAL_TABLET | ORAL | 0 refills | Status: DC | PRN
Start: 2023-10-08 — End: 2024-01-14

## 2023-10-08 NOTE — Progress Notes (Signed)
   Established Patient Office Visit  Subjective   Patient ID: Cory Houston, male    DOB: 1956-06-16  Age: 67 y.o. MRN: [id number]  Chief Complaint  Patient presents with   Medication Management    Pt would like to discuss taking Cialis again. States he was on prescription 12 years ago.     HPI  ED: states for the past 2-3 months he has noticed some preformance with his wife. States that he can get an erection but having difficulty maintaining an erectoin. States approx 12-13 years ago he lost his first wife. States that he started dating his current wife. States that he experienced pyschological ed and he was place on Cialis.  Patient states that his wife is younger than him and currently going through perimenopause is taking her a little longer to ejaculate during intercourse.  Patient states he has no trouble getting erection but if they change positions or some distracting event happens he will lose the erection.  States he is able to get it back but it does take some time    Review of Systems  Constitutional:  Negative for chills and fever.  Respiratory:  Negative for shortness of breath.   Cardiovascular:  Negative for chest pain.  Neurological:  Negative for headaches.      Objective:     BP 110/70   Pulse 69   Temp 98 F (36.7 C) (Oral)   Ht 5' 9.05" (1.754 m)   Wt 211 lb 6.4 oz (95.9 kg)   SpO2 95%   BMI 31.17 kg/m    Physical Exam Vitals and nursing note reviewed.  Constitutional:      Appearance: Normal appearance.  Cardiovascular:     Rate and Rhythm: Normal rate and regular rhythm.     Heart sounds: Normal heart sounds.  Pulmonary:     Effort: Pulmonary effort is normal.     Breath sounds: Normal breath sounds.  Neurological:     Mental Status: He is alert.      No results found for any visits on 10/08/23.    The ASCVD Risk score (Arnett DK, et al., 2019) failed to calculate for the following reasons:   The valid total cholesterol range is 130  to 320 mg/dL    Assessment & Plan:   Problem List Items Addressed This Visit       Other   Erectile dysfunction - Primary    History of psychological erectile dysfunction.  Patient responded well with tadalafil.  Will write patient 30 tablets which will last him 2 months.  He will use a GoodRx card as it seems to be cheaper that way.  Signs and symptoms reviewed when to stop using medication and when to seek emergent health care.      Relevant Medications   tadalafil (CIALIS) 10 MG tablet    Return in about 4 months (around 02/05/2024) for DM recheck.    Audria Nine, NP

## 2023-10-08 NOTE — Patient Instructions (Signed)
Nice to see you today I have sent the medication to the pharmacy Follow up with me in March for your diabetes, sooner if you need me

## 2023-10-08 NOTE — Assessment & Plan Note (Signed)
History of psychological erectile dysfunction.  Patient responded well with tadalafil.  Will write patient 30 tablets which will last him 2 months.  He will use a GoodRx card as it seems to be cheaper that way.  Signs and symptoms reviewed when to stop using medication and when to seek emergent health care.

## 2023-10-30 ENCOUNTER — Other Ambulatory Visit: Payer: Self-pay

## 2023-10-30 MED ORDER — EZETIMIBE 10 MG PO TABS: 10.0000 mg | ORAL_TABLET | Freq: Every day | ORAL | 9 refills | Status: AC

## 2023-10-30 NOTE — Telephone Encounter (Signed)
Requested Prescriptions   Signed Prescriptions Disp Refills   ezetimibe (ZETIA) 10 MG tablet 30 tablet 9    Sig: Take 1 tablet (10 mg total) by mouth daily. Due yearly follow up visit.  PLEASE CALL OFFICE TO SCHEDULE APPOINTMENT PRIOR TO NEXT REFILL (first attempt)    Authorizing Provider: Debbe Odea    Ordering User: Guerry Minors

## 2023-10-30 NOTE — Telephone Encounter (Signed)
Last visit: 11/09/23 with plan to f/u 12 months  next visit: none/active recall   Please schedule follow up appt, Thanks!

## 2023-11-04 NOTE — Telephone Encounter (Signed)
Appointment scheduled for 12/26/23

## 2023-11-09 ENCOUNTER — Other Ambulatory Visit: Payer: Self-pay | Admitting: Nurse Practitioner

## 2023-11-09 DIAGNOSIS — I10 Essential (primary) hypertension: Secondary | ICD-10-CM

## 2023-11-10 ENCOUNTER — Other Ambulatory Visit: Payer: Self-pay

## 2023-11-10 MED ORDER — METOPROLOL SUCCINATE ER 25 MG PO TB24: 25.0000 mg | ORAL_TABLET | Freq: Every day | ORAL | 1 refills | Status: DC

## 2023-11-10 NOTE — Telephone Encounter (Signed)
Requested Prescriptions   Signed Prescriptions Disp Refills   metoprolol succinate (TOPROL XL) 25 MG 24 hr tablet 30 tablet 1    Sig: Take 1 tablet (25 mg total) by mouth daily.    Authorizing Provider: Debbe Odea    Ordering User: Guerry Minors   last office visit: 11/08/22 plan to f/u in 12 months  next visit:  12/26/23

## 2023-11-19 ENCOUNTER — Other Ambulatory Visit: Payer: Self-pay | Admitting: Nurse Practitioner

## 2023-11-19 DIAGNOSIS — E119 Type 2 diabetes mellitus without complications: Secondary | ICD-10-CM

## 2023-11-25 ENCOUNTER — Telehealth: Payer: Self-pay

## 2023-11-25 NOTE — Telephone Encounter (Signed)
 Please start PA.   Copied from CRM [id number]. Topic: Clinical - Prescription Issue >> Nov 25, 2023 10:33 AM Macario HERO wrote: Reason for CRM: Patient said his insurance company changed prescription plan and needs prior authorization for 2 medications: tirzepatide  (MOUNJARO ) 7.5 MG/0.5ML Pen [[id number]] AND icosapent  Ethyl (VASCEPA ) 1 g capsule [[id number]]  and may need a new prescription for Mounjaro  - they only want to pay for a 1 month supply and not a 3 month supply.

## 2023-11-30 ENCOUNTER — Other Ambulatory Visit: Payer: Self-pay | Admitting: Nurse Practitioner

## 2023-12-03 ENCOUNTER — Other Ambulatory Visit (HOSPITAL_COMMUNITY): Payer: Self-pay

## 2023-12-03 ENCOUNTER — Other Ambulatory Visit (HOSPITAL_BASED_OUTPATIENT_CLINIC_OR_DEPARTMENT_OTHER): Payer: Self-pay

## 2023-12-03 NOTE — Telephone Encounter (Signed)
 When attempting PA, CMM states that pt is inactive, please have pt verify active coverage details. Thank you.

## 2023-12-04 NOTE — Telephone Encounter (Signed)
Left detailed voicemail for patient to call the office back with insurance information.

## 2023-12-05 NOTE — Telephone Encounter (Signed)
LM for pt to returncall

## 2023-12-10 NOTE — Telephone Encounter (Addendum)
3rd attempt to reach pt. Left detailed message on VM per DPR that we need a copy of his insurance card(s). Advised he could do this by taking a picture and attaching it in a MyChart Message or come by the office so they can be scanned in.

## 2023-12-21 ENCOUNTER — Other Ambulatory Visit: Payer: Self-pay | Admitting: Nurse Practitioner

## 2023-12-21 DIAGNOSIS — I10 Essential (primary) hypertension: Secondary | ICD-10-CM

## 2023-12-22 ENCOUNTER — Other Ambulatory Visit: Payer: Self-pay

## 2023-12-22 MED ORDER — ROSUVASTATIN CALCIUM 40 MG PO TABS: 40.0000 mg | ORAL_TABLET | Freq: Every day | ORAL | 0 refills | Status: DC

## 2023-12-22 NOTE — Telephone Encounter (Signed)
Requested Prescriptions   Signed Prescriptions Disp Refills   rosuvastatin (CRESTOR) 40 MG tablet 30 tablet 0    Sig: Take 1 tablet (40 mg total) by mouth daily.    Authorizing Provider: Debbe Odea    Ordering User: Guerry Minors   Last office visit:  11/08/22  with plan to f/u in 12 months  Next office visit: 12/26/23

## 2023-12-26 ENCOUNTER — Ambulatory Visit: Payer: BC Managed Care – PPO | Attending: Cardiology | Admitting: Cardiology

## 2023-12-26 ENCOUNTER — Encounter: Payer: Self-pay | Admitting: Cardiology

## 2023-12-26 VITALS — BP 126/74 | HR 63 | Ht 71.0 in | Wt 214.2 lb

## 2023-12-26 DIAGNOSIS — E781 Pure hyperglyceridemia: Secondary | ICD-10-CM

## 2023-12-26 DIAGNOSIS — I493 Ventricular premature depolarization: Secondary | ICD-10-CM | POA: Diagnosis not present

## 2023-12-26 DIAGNOSIS — I10 Essential (primary) hypertension: Secondary | ICD-10-CM

## 2023-12-26 MED ORDER — ICOSAPENT ETHYL 1 G PO CAPS: 2.0000 g | ORAL_CAPSULE | Freq: Two times a day (BID) | ORAL | 3 refills | Status: DC

## 2023-12-26 MED ORDER — FENOFIBRATE 145 MG PO TABS: 145.0000 mg | ORAL_TABLET | Freq: Every day | ORAL | 3 refills | Status: DC

## 2023-12-26 NOTE — Patient Instructions (Signed)
 Medication Instructions:   Your physician recommends that you continue on your current medications as directed. Please refer to the Current Medication list given to you today.  *If you need a refill on your cardiac medications before your next appointment, please call your pharmacy*   Lab Work:  None Ordered  If you have labs (blood work) drawn today and your tests are completely normal, you will receive your results only by: MyChart Message (if you have MyChart) OR A paper copy in the mail If you have any lab test that is abnormal or we need to change your treatment, we will call you to review the results.   Testing/Procedures:  None Ordered    Follow-Up: At [facility], you and your health needs are our priority.  As part of our continuing mission to provide you with exceptional heart care, we have created designated Provider Care Teams.  These Care Teams include your primary Cardiologist (physician) and Advanced Practice Providers (APPs -  Physician Assistants and Nurse Practitioners) who all work together to provide you with the care you need, when you need it.  We recommend signing up for the patient portal called "MyChart".  Sign up information is provided on this After Visit Summary.  MyChart is used to connect with patients for Virtual Visits (Telemedicine).  Patients are able to view lab/test results, encounter notes, upcoming appointments, etc.  Non-urgent messages can be sent to your provider as well.   To learn more about what you can do with MyChart, go to [email or web address].    Your next appointment:   12 month(s)  Provider:   You may see Debbe Odea, MD or one of the following Advanced Practice Providers on your designated Care Team:   Nicolasa Ducking, NP Eula Listen, PA-C Cadence Fransico Michael, PA-C Charlsie Quest, NP Carlos Levering, NP

## 2023-12-26 NOTE — Progress Notes (Signed)
 Cardiology Office Note:    Date:  12/26/2023   ID:  Cory Houston, DOB [birth year], MRN [id number]  PCP:  Wendee Lynwood HERO, NP   [facility]  Cardiologist:  Redell Cave, MD  Advanced Practice Provider:  No care team member to display Electrophysiologist:  None       Referring MD: Wendee Lynwood HERO, NP   No chief complaint on file.   History of Present Illness:    Cory Houston is a 68 y.o. male with a hx of hypertension, hypertriglyceridemia, PVCs, diabetes presents for follow-up.   States feeling well, recently started on Mounjaro  for diabetes control.  This has helped him with weight loss, has also help with cholesterol and A1c/diabetes control.  He feels well, compliant with medications as prescribed.  Denies any concerns today.  Prior notes Cardiac monitor 02/2021 frequent PVCs, 2.3% PVC burden.   Past Medical History:  Diagnosis Date   Allergy    Arthritis    Diabetes mellitus without complication (HCC)    GERD (gastroesophageal reflux disease)    Hypertension    Hypertriglyceridemia    Preventative health care 07/23/2023   Sleep apnea     Past Surgical History:  Procedure Laterality Date   KNEE SURGERY Right 2004   VASECTOMY      Current Medications: Current Meds  Medication Sig   amLODipine  (NORVASC ) 10 MG tablet TAKE 1 TABLET BY MOUTH EVERY DAY   Cholecalciferol (VITAMIN D -3) 5000 units TABS Take 1 tablet by mouth every other day.    ezetimibe  (ZETIA ) 10 MG tablet Take 1 tablet (10 mg total) by mouth daily. Due yearly follow up visit.  PLEASE CALL OFFICE TO SCHEDULE APPOINTMENT PRIOR TO NEXT REFILL (first attempt)   JARDIANCE  10 MG TABS tablet TAKE 1 TABLET BY MOUTH EVERY DAY BEFORE BREAKFAST   loratadine  (CLARITIN ) 10 MG tablet TAKE 1 TABLET BY MOUTH EVERY DAY   metFORMIN  (GLUCOPHAGE -XR) 500 MG 24 hr tablet TAKE 2 TABLETS BY MOUTH TWICE A DAY   metoprolol  succinate (TOPROL  XL) 25 MG 24 hr tablet Take 1 tablet (25 mg total) by mouth  daily.   Multiple Vitamin (MULTIVITAMIN) tablet Take 1 tablet by mouth daily.   omeprazole  (PRILOSEC) 20 MG capsule TAKE 1 CAPSULE BY MOUTH EVERY DAY   ramipril  (ALTACE ) 5 MG capsule TAKE 1 CAPSULE BY MOUTH EVERY DAY   rosuvastatin  (CRESTOR ) 40 MG tablet Take 1 tablet (40 mg total) by mouth daily.   tadalafil  (CIALIS ) 10 MG tablet Take 1 tablet (10 mg total) by mouth every other day as needed for erectile dysfunction.   tirzepatide  (MOUNJARO ) 7.5 MG/0.5ML Pen INJECT 7.5 MG SUBCUTANEOUSLY WEEKLY   triamcinolone  cream (KENALOG ) 0.1 % Apply 1 Application topically 2 (two) times daily. To affected area.   [DISCONTINUED] fenofibrate  (TRICOR ) 145 MG tablet Take 1 tablet (145 mg total) by mouth daily. PLEASE CALL [phone] TO SCHEDULE YEARLY VISIT AND TO RECEIVE FURTHER REFILLS. THANK YOU.   [DISCONTINUED] icosapent  Ethyl (VASCEPA ) 1 g capsule Take 2 capsules (2 g total) by mouth 2 (two) times daily.     Allergies:   Codeine and Pholcodine   Social History   Socioeconomic History   Marital status: Married    Spouse name: Shanette   Number of children: 2   Years of education: Not on file   Highest education level: Master's degree (e.g., MA, MS, MEng, MEd, MSW, MBA)  Occupational History   Not on file  Tobacco Use   Smoking status: Never  Smokeless tobacco: Never  Vaping Use   Vaping status: Never Used  Substance and Sexual Activity   Alcohol use: Yes    Comment: rarely 1 per month   Drug use: No   Sexual activity: Yes    Birth control/protection: Surgical  Other Topics Concern   Not on file  Social History Narrative   4 step children    Widowed   Son (40) Byren   Aileen (30) daughter       Fulltime: set designer       Hobbies: model proofreader   Social Drivers of Corporate Investment Banker Strain: Low Risk  (10/08/2023)   Overall Financial Resource Strain (CARDIA)    Difficulty of Paying Living Expenses: Not very hard  Food Insecurity: No Food Insecurity  (10/08/2023)   Hunger Vital Sign    Worried About Running Out of Food in the Last Year: Never true    Ran Out of Food in the Last Year: Never true  Transportation Needs: No Transportation Needs (10/08/2023)   PRAPARE - Administrator, Civil Service (Medical): No    Lack of Transportation (Non-Medical): No  Physical Activity: Sufficiently Active (10/08/2023)   Exercise Vital Sign    Days of Exercise per Week: 6 days    Minutes of Exercise per Session: 150+ min  Stress: No Stress Concern Present (10/08/2023)   [employer] of Occupational Health - Occupational Stress Questionnaire    Feeling of Stress : Only a little  Social Connections: Socially Integrated (10/08/2023)   Social Connection and Isolation Panel [NHANES]    Frequency of Communication with Friends and Family: More than three times a week    Frequency of Social Gatherings with Friends and Family: Twice a week    Attends Religious Services: More than 4 times per year    Active Member of [employer] or Organizations: Yes    Attends Engineer, Structural: More than 4 times per year    Marital Status: Married     Family History: The patient's family history includes Cataracts in his sister; Congestive Heart Failure in his father and mother; Diabetes in his father and mother; Diverticulitis in his father; Hearing loss in his father; Heart attack in his maternal grandmother; Heart disease in his mother; Heart murmur in his son; Hypercholesterolemia in his father and mother; Hypertension in his brother, father, mother, paternal aunt, and paternal uncle; Lung cancer in his maternal grandmother; Obesity in his brother and mother; Stroke in his paternal grandfather and paternal grandmother. There is no history of Colon cancer, Colon polyps, Esophageal cancer, Rectal cancer, or Stomach cancer.  ROS:   Please see the history of present illness.     All other systems reviewed and are negative.  EKGs/Labs/Other Studies  Reviewed:    The following studies were reviewed today:   EKG:  EKG is ordered today.  EKG shows normal sinus rhythm, normal ECG  Recent Labs: 07/23/2023: ALT 37; BUN 20; Creatinine, Ser 0.99; Hemoglobin 14.8; Platelets 162.0; Potassium 4.4; Sodium 139; TSH 1.51  Recent Lipid Panel    Component Value Date/Time   CHOL 82 07/23/2023 0911   CHOL 96 (L) 11/05/2022 1117   TRIG 164.0 (H) 07/23/2023 0911   HDL 23.60 (L) 07/23/2023 0911   HDL 23 (L) 11/05/2022 1117   CHOLHDL 3 07/23/2023 0911   VLDL 32.8 07/23/2023 0911   LDLCALC 26 07/23/2023 0911   LDLCALC 18 11/05/2022 1117   LDLDIRECT 65.7 08/15/2021 9176  Risk Assessment/Calculations:      Physical Exam:    VS:  BP 126/74   Pulse 63   Ht 5' 11 (1.803 m)   Wt 214 lb 3.2 oz (97.2 kg)   SpO2 96%   BMI 29.87 kg/m     Wt Readings from Last 3 Encounters:  12/26/23 214 lb 3.2 oz (97.2 kg)  10/08/23 211 lb 6.4 oz (95.9 kg)  07/23/23 206 lb 3.2 oz (93.5 kg)     GEN:  Well nourished, well developed in no acute distress HEENT: Normal NECK: No JVD; No carotid bruits CARDIAC: RRR, no murmurs, rubs, gallops RESPIRATORY:  Clear to auscultation without rales, wheezing or rhonchi  ABDOMEN: Soft, non-tender, non-distended MUSCULOSKELETAL:  No edema; No deformity  SKIN: Warm and dry NEUROLOGIC:  Alert and oriented x 3 PSYCHIATRIC:  Normal affect   ASSESSMENT:    1. PVC's (premature ventricular contractions)   2. Pure hypertriglyceridemia   3. Primary hypertension    PLAN:    In order of problems listed above:  Frequent PVCs, palpitations, resolved with Toprol , continue Toprol -XL 25 mg daily. hypertriglyceridemia.  Triglycerides improving.  Continue Crestor  40 mg daily Zetia , Vascepa ,  fenofibrate .   Hypertension, BP controlled.  Continue Norvasc  10 and ramipril  5mg .  Follow-up in 1 year.  Medication Adjustments/Labs and Tests Ordered: Current medicines are reviewed at length with the patient today.  Concerns  regarding medicines are outlined above.  Orders Placed This Encounter  Procedures   EKG 12-Lead    Meds ordered this encounter  Medications   fenofibrate  (TRICOR ) 145 MG tablet    Sig: Take 1 tablet (145 mg total) by mouth daily.    Dispense:  90 tablet    Refill:  3   icosapent  Ethyl (VASCEPA ) 1 g capsule    Sig: Take 2 capsules (2 g total) by mouth 2 (two) times daily.    Dispense:  360 capsule    Refill:  3     Patient Instructions  Medication Instructions:   Your physician recommends that you continue on your current medications as directed. Please refer to the Current Medication list given to you today.  *If you need a refill on your cardiac medications before your next appointment, please call your pharmacy*   Lab Work:  None Ordered  If you have labs (blood work) drawn today and your tests are completely normal, you will receive your results only by: MyChart Message (if you have MyChart) OR A paper copy in the mail If you have any lab test that is abnormal or we need to change your treatment, we will call you to review the results.   Testing/Procedures:  None Ordered   Follow-Up: At [facility], you and your health needs are our priority.  As part of our continuing mission to provide you with exceptional heart care, we have created designated Provider Care Teams.  These Care Teams include your primary Cardiologist (physician) and Advanced Practice Providers (APPs -  Physician Assistants and Nurse Practitioners) who all work together to provide you with the care you need, when you need it.  We recommend signing up for the patient portal called MyChart.  Sign up information is provided on this After Visit Summary.  MyChart is used to connect with patients for Virtual Visits (Telemedicine).  Patients are able to view lab/test results, encounter notes, upcoming appointments, etc.  Non-urgent messages can be sent to your provider as well.   To learn more  about what you can  do with MyChart, go to [email or web address].    Your next appointment:   12 month(s)  Provider:   You may see Redell Cave, MD or one of the following Advanced Practice Providers on your designated Care Team:   Lonni Meager, NP Bernardino Bring, PA-C Cadence Franchester, PA-C Tylene Lunch, NP Barnie Hila, NP    Signed, Redell Cave, MD  12/26/2023 10:48 AM    [facility]

## 2024-01-12 ENCOUNTER — Other Ambulatory Visit: Payer: Self-pay | Admitting: Nurse Practitioner

## 2024-01-12 DIAGNOSIS — N529 Male erectile dysfunction, unspecified: Secondary | ICD-10-CM

## 2024-01-13 ENCOUNTER — Other Ambulatory Visit: Payer: Self-pay

## 2024-01-13 MED ORDER — ROSUVASTATIN CALCIUM 40 MG PO TABS: 40.0000 mg | ORAL_TABLET | Freq: Every day | ORAL | 11 refills | Status: AC

## 2024-01-18 ENCOUNTER — Other Ambulatory Visit: Payer: Self-pay | Admitting: Nurse Practitioner

## 2024-01-27 NOTE — Progress Notes (Unsigned)
   Cory Dosanjh T. Zehra Rucci, MD, CAQ Sports Medicine [facility] [address] [place], [address]  Phone: [phone]  FAX: [phone]  Cory Houston - 68 y.o. male  MRN [id number]  Date of Birth: 04-24-1956  Date: 01/28/2024  PCP: Cory Emms, NP  Referral: Cory Emms, NP  No chief complaint on file.  Subjective:   Cory Houston is a 68 y.o. very pleasant male patient with There is no height or weight on file to calculate BMI. who presents with the following:  He is a 68 year old gentleman who presents with some ongoing shoulder pain.    Review of Systems is noted in the HPI, as appropriate  Objective:   There were no vitals taken for this visit.  GEN: No acute distress; alert,appropriate. PULM: Breathing comfortably in no respiratory distress PSYCH: Normally interactive.   Laboratory and Imaging Data:  Assessment and Plan:   ***

## 2024-01-28 ENCOUNTER — Ambulatory Visit: Admitting: Family Medicine

## 2024-01-28 ENCOUNTER — Ambulatory Visit (INDEPENDENT_AMBULATORY_CARE_PROVIDER_SITE_OTHER)
Admission: RE | Admit: 2024-01-28 | Discharge: 2024-01-28 | Disposition: A | Discharge status: Home / Self Care | Source: Ambulatory Visit | Attending: Family Medicine | Admitting: Family Medicine

## 2024-01-28 ENCOUNTER — Encounter: Payer: Self-pay | Admitting: Family Medicine

## 2024-01-28 VITALS — BP 130/80 | HR 76 | Temp 97.5°F | Ht 71.0 in | Wt 220.2 lb

## 2024-01-28 DIAGNOSIS — G8929 Other chronic pain: Secondary | ICD-10-CM

## 2024-01-28 DIAGNOSIS — M12811 Other specific arthropathies, not elsewhere classified, right shoulder: Secondary | ICD-10-CM

## 2024-01-28 DIAGNOSIS — M25511 Pain in right shoulder: Secondary | ICD-10-CM

## 2024-01-28 DIAGNOSIS — M19011 Primary osteoarthritis, right shoulder: Secondary | ICD-10-CM

## 2024-01-28 MED ORDER — TRIAMCINOLONE ACETONIDE 40 MG/ML IJ SUSP
40.0000 mg | Freq: Once | INTRAMUSCULAR | Status: AC
  Administered 2024-01-28: 40 mg via INTRA_ARTICULAR

## 2024-02-05 ENCOUNTER — Ambulatory Visit: Payer: BC Managed Care – PPO | Admitting: Nurse Practitioner

## 2024-02-05 VITALS — BP 112/70 | HR 65 | Temp 97.6°F | Ht 71.0 in | Wt 210.8 lb

## 2024-02-05 DIAGNOSIS — E119 Type 2 diabetes mellitus without complications: Secondary | ICD-10-CM | POA: Diagnosis not present

## 2024-02-05 DIAGNOSIS — L853 Xerosis cutis: Secondary | ICD-10-CM

## 2024-02-05 DIAGNOSIS — Z7984 Long term (current) use of oral hypoglycemic drugs: Secondary | ICD-10-CM

## 2024-02-05 DIAGNOSIS — N529 Male erectile dysfunction, unspecified: Secondary | ICD-10-CM

## 2024-02-05 LAB — POCT GLYCOSYLATED HEMOGLOBIN (HGB A1C): Hemoglobin A1C: 6.2 % — AB (ref 4.0–5.6)

## 2024-02-05 NOTE — Progress Notes (Signed)
 Established Patient Office Visit  Subjective   Patient ID: Cory Houston, male    DOB: Feb 09, 1956  Age: 68 y.o. MRN: [id number]  Chief Complaint  Patient presents with   Diabetes      DM2: he is currenlty on jardiance 10, metformin 500mg  2 tabs BID, and mounjaro 7.5mg  weekly. He is checking it once a week and in the morning 120-125. No hypoglycemia. No trouble with the mediaciotn   ED: he is using the Cialis and having good result.   Skin dry: states that over the past 2 months.  He is wondering everything to do with the diabetes.    Review of Systems  Constitutional:  Negative for chills and fever.  Respiratory:  Negative for shortness of breath.   Cardiovascular:  Negative for chest pain.  Gastrointestinal:  Negative for abdominal pain, constipation, diarrhea, nausea and vomiting.       BM daily       Objective:     BP 112/70   Pulse 65   Temp 97.6 F (36.4 C) (Oral)   Ht 5\' 11"  (1.803 m)   Wt 210 lb 12.8 oz (95.6 kg)   SpO2 97%   BMI 29.40 kg/m  BP Readings from Last 3 Encounters:  02/05/24 112/70  01/28/24 130/80  12/26/23 126/74   Wt Readings from Last 3 Encounters:  02/05/24 210 lb 12.8 oz (95.6 kg)  01/28/24 220 lb 4 oz (99.9 kg)  12/26/23 214 lb 3.2 oz (97.2 kg)   SpO2 Readings from Last 3 Encounters:  02/05/24 97%  01/28/24 98%  12/26/23 96%      Physical Exam Vitals and nursing note reviewed.  Constitutional:      Appearance: Normal appearance.  Cardiovascular:     Rate and Rhythm: Normal rate and regular rhythm.     Heart sounds: Normal heart sounds.  Pulmonary:     Effort: Pulmonary effort is normal.     Breath sounds: Normal breath sounds.  Abdominal:     General: Bowel sounds are normal.  Skin:    General: Skin is dry.  Neurological:     Mental Status: He is alert.      Results for orders placed or performed in visit on 02/05/24  POCT glycosylated hemoglobin (Hb A1C)  Result Value Ref Range   Hemoglobin A1C 6.2 (A) 4.0  - 5.6 %   HbA1c POC (<> result, manual entry)     HbA1c, POC (prediabetic range)     HbA1c, POC (controlled diabetic range)        The ASCVD Risk score (Arnett DK, et al., 2019) failed to calculate for the following reasons:   The valid total cholesterol range is 130 to 320 mg/dL    Assessment & Plan:   Problem List Items Addressed This Visit       Endocrine   Well controlled type 2 diabetes mellitus without evidence of diabetic end organ manifestation (HCC) - Primary   A1c well-controlled.  Patient currently maintained on metformin 1000 mg twice daily, Jardiance 10 mg daily, and Mounjaro 7.5 mg weekly.  Continue medications prescribed      Relevant Orders   POCT glycosylated hemoglobin (Hb A1C) (Completed)     Musculoskeletal and Integument   Dry skin   Patient to use lotion at least daily.  Likely secondary to cold dry air.  Patient will avoid superhot showers and use pat dry method        Other   Erectile dysfunction   Patient  currently maintained on tadalafil 10 mg every 48 hours as needed secondary course.  Good result no side effects continue       Return in about 6 months (around 08/07/2024) for CPE and Labs.    Audria Nine, NP

## 2024-02-05 NOTE — Assessment & Plan Note (Signed)
 Patient to use lotion at least daily.  Likely secondary to cold dry air.  Patient will avoid superhot showers and use pat dry method

## 2024-02-05 NOTE — Assessment & Plan Note (Signed)
 Patient currently maintained on tadalafil 10 mg every 48 hours as needed secondary course.  Good result no side effects continue

## 2024-02-05 NOTE — Patient Instructions (Signed)
 Nice to see you today A1C was 6.2% which is great Follow up with me in 6 months for your physical and labs, sooner if you need me

## 2024-02-05 NOTE — Assessment & Plan Note (Signed)
 A1c well-controlled.  Patient currently maintained on metformin 1000 mg twice daily, Jardiance 10 mg daily, and Mounjaro 7.5 mg weekly.  Continue medications prescribed

## 2024-02-09 ENCOUNTER — Ambulatory Visit (INDEPENDENT_AMBULATORY_CARE_PROVIDER_SITE_OTHER): Payer: Self-pay | Admitting: Oncology

## 2024-02-09 ENCOUNTER — Encounter: Payer: Self-pay | Admitting: Oncology

## 2024-02-09 ENCOUNTER — Other Ambulatory Visit: Payer: Self-pay

## 2024-02-09 VITALS — BP 145/75 | HR 74 | Temp 97.1°F | Ht 71.0 in

## 2024-02-09 DIAGNOSIS — R21 Rash and other nonspecific skin eruption: Secondary | ICD-10-CM

## 2024-02-09 MED ORDER — TRIAMCINOLONE ACETONIDE 0.1 % EX CREA: 1.0000 | TOPICAL_CREAM | Freq: Two times a day (BID) | CUTANEOUS | 0 refills | Status: DC

## 2024-02-09 MED ORDER — PREDNISONE 20 MG PO TABS: 40.0000 mg | ORAL_TABLET | Freq: Every day | ORAL | 0 refills | Status: DC

## 2024-02-09 NOTE — Progress Notes (Unsigned)
 [facility]  [address] [place], [place] [address] Phone: [phone] Fax: [phone]   Office Visit Note  Patient Name: Cory Houston  Date of Birth:01-13-2056  Med Rec number [id number]  Date of Service: 02/09/2024  Codeine and Pholcodine  Chief Complaint  Patient presents with   Rash    Patient c/o mildly itchy red rash on his arms and legs since Saturday. Patient denies using any new soaps or detergents but states he did use some of his wife's lotion on his upper body as a moisturizer prior to developing the rash.    HPI Patient is an 68 y.o. who presents for concerns of a rash that he noticed on saturda warly adternoon. Used his wife cerve cream because arms were dry.   Non itchy but its spreading and is ow on   Takes claritin.   Nothing OTC.   Current Medication:  Outpatient Encounter Medications as of 02/09/2024  Medication Sig   amLODipine (NORVASC) 10 MG tablet TAKE 1 TABLET BY MOUTH EVERY DAY   Cholecalciferol (VITAMIN D-3) 5000 units TABS Take 1 tablet by mouth every other day.    ezetimibe (ZETIA) 10 MG tablet Take 1 tablet (10 mg total) by mouth daily. Due yearly follow up visit.  PLEASE CALL OFFICE TO SCHEDULE APPOINTMENT PRIOR TO NEXT REFILL (first attempt)   fenofibrate (TRICOR) 145 MG tablet Take 1 tablet (145 mg total) by mouth daily.   icosapent Ethyl (VASCEPA) 1 g capsule Take 2 capsules (2 g total) by mouth 2 (two) times daily.   JARDIANCE 10 MG TABS tablet TAKE 1 TABLET BY MOUTH EVERY DAY BEFORE BREAKFAST   loratadine (CLARITIN) 10 MG tablet TAKE 1 TABLET BY MOUTH EVERY DAY   metFORMIN (GLUCOPHAGE-XR) 500 MG 24 hr tablet TAKE 2 TABLETS BY MOUTH TWICE A DAY   metoprolol succinate (TOPROL XL) 25 MG 24 hr tablet Take 1 tablet (25 mg total) by mouth daily.   Multiple Vitamin (MULTIVITAMIN) tablet Take 1 tablet by mouth daily.   omeprazole (PRILOSEC) 20 MG capsule TAKE 1 CAPSULE BY MOUTH EVERY DAY   predniSONE (DELTASONE) 20 MG tablet Take 2  tablets (40 mg total) by mouth daily with breakfast.   ramipril (ALTACE) 5 MG capsule TAKE 1 CAPSULE BY MOUTH EVERY DAY   rosuvastatin (CRESTOR) 40 MG tablet Take 1 tablet (40 mg total) by mouth daily.   tadalafil (CIALIS) 10 MG tablet TAKE 1 TABLET (10 MG TOTAL) BY MOUTH EVERY OTHER DAY AS NEEDED FOR ERECTILE DYSFUNCTION.   tirzepatide (MOUNJARO) 7.5 MG/0.5ML Pen INJECT 7.5 MG SUBCUTANEOUSLY WEEKLY   triamcinolone cream (KENALOG) 0.1 % Apply 1 Application topically 2 (two) times daily. To affected area.   triamcinolone cream (KENALOG) 0.1 % Apply 1 Application topically 2 (two) times daily.   No facility-administered encounter medications on file as of 02/09/2024.      Medical History: Past Medical History:  Diagnosis Date   Allergy    Arthritis    Diabetes mellitus without complication (HCC)    GERD (gastroesophageal reflux disease)    Hypertension    Hypertriglyceridemia    Preventative health care 07/23/2023   Sleep apnea      Vital Signs: BP (!) 145/75   Pulse 74   Temp (!) 97.1 F (36.2 C)   Ht 5\' 11"  (1.803 m)   SpO2 96%   BMI 29.40 kg/m   ROS: As per HPI.  All other pertinent ROS negative.     Review of Systems  Skin:  Positive for rash.    Physical Exam Constitutional:      Appearance: Normal appearance.  Skin:    Findings: Rash present. Rash is macular and papular.     Comments: To bilateral   Neurological:     Mental Status: He is alert.     No results found for this or any previous visit (from the past 24 hours).  Assessment/Plan: 1. Rash and nonspecific skin eruption (Primary) Unclear etiology but likely secondary to recent vacation and hiking.  Denies any encounters with poison ivy.  Rash is not itchy.  We discussed that given its spreading would recommend 40 mg prednisone each morning x 5 days and may use steroid cream as needed for painful areas or if itching starts.  Tonight I recommend you take Benadryl 25 to 50 mg as it is too late in the day  to begin your steroids and start them in the morning with food and water.  - predniSONE (DELTASONE) 20 MG tablet; Take 2 tablets (40 mg total) by mouth daily with breakfast.  Dispense: 10 tablet; Refill: 0 - triamcinolone cream (KENALOG) 0.1 %; Apply 1 Application topically 2 (two) times daily.  Dispense: 30 g; Refill: 0   General Counseling: Terique verbalizes understanding of the findings of todays visit and agrees with plan of treatment. I have discussed any further diagnostic evaluation that may be needed or ordered today. We also reviewed his medications today. he has been encouraged to call the office with any questions or concerns that should arise related to todays visit.   No orders of the defined types were placed in this encounter.   Meds ordered this encounter  Medications   predniSONE (DELTASONE) 20 MG tablet    Sig: Take 2 tablets (40 mg total) by mouth daily with breakfast.    Dispense:  10 tablet    Refill:  0   triamcinolone cream (KENALOG) 0.1 %    Sig: Apply 1 Application topically 2 (two) times daily.    Dispense:  30 g    Refill:  0    I spent 20 minutes dedicated to the care of this patient (face-to-face and non-face-to-face) on the date of the encounter to include what is described in the assessment and plan.   Durenda Hurt, NP 02/09/2024 3:27 PM

## 2024-02-17 ENCOUNTER — Telehealth: Payer: Self-pay

## 2024-02-17 NOTE — Telephone Encounter (Signed)
 Copied from CRM [id number]. Topic: Clinical - Medical Advice >> Feb 17, 2024  3:07 PM Sonny Dandy B wrote: Reason for CRM: pt is requesting an accomodation letter for work.. Pt states he was seen for rotator cuff problem. Still has problems with his rotator cuff. Pt is requesting an accomodation letter from Dr. Karleen Hampshire [place]. Please call pt back at [phone]

## 2024-02-18 ENCOUNTER — Encounter: Payer: Self-pay | Admitting: Family Medicine

## 2024-02-18 NOTE — Telephone Encounter (Signed)
 Can you help clarify.  Does he want to get FMLA or does he just want a letter?   He may have some permanent pain his shoulder - he does have severe arthritis.  I can make a poundage of lifting limitation.  Some employers may not allow him an accomodation and continue to work.   I might often say something like: due to an ongoing shoulder problem, Mr. Thune should be able to lift 10 pounds maximally, and please allow an accomodation if possible.

## 2024-02-18 NOTE — Telephone Encounter (Signed)
 Looks good

## 2024-02-18 NOTE — Telephone Encounter (Signed)
 Spoke with Cory Houston.  He needs a letter stating he needs an electric hose reel at work due to shoulder pain and trouble with rolling arm motion.  Letter written and sent to Dr. Patsy Lager to review.  Okay to send letter to his MyChart.

## 2024-02-24 ENCOUNTER — Other Ambulatory Visit: Payer: Self-pay

## 2024-02-24 MED ORDER — METOPROLOL SUCCINATE ER 25 MG PO TB24: 25.0000 mg | ORAL_TABLET | Freq: Every day | ORAL | 3 refills | Status: DC

## 2024-03-04 ENCOUNTER — Other Ambulatory Visit: Payer: Self-pay | Admitting: Nurse Practitioner

## 2024-03-04 NOTE — Telephone Encounter (Signed)
 Copied from CRM [id number]. Topic: Clinical - Medication Refill >> Mar 04, 2024 11:14 AM Elita Guitar wrote: Most Recent Primary Care Visit:  Provider: Dorothe Gaster  Department: [facility]  Visit Type: OFFICE VISIT  Date: 02/05/2024  Medication: Mounjaro  Has the patient contacted their pharmacy? Yes "They said they needed an Update from [facility]"  Is this the correct pharmacy for this prescription? Yes If no, delete pharmacy and type the correct one.  This is the patient's preferred pharmacy:  [id number] [place], [place] - [address] [place] [address] Phone: [phone] Fax: [phone]   Has the prescription been filled recently? Yes  Is the patient out of the medication? Yes  Has the patient been seen for an appointment in the last year OR does the patient have an upcoming appointment? Yes  Can we respond through MyChart? Yes  Agent: Please be advised that Rx refills may take up to 3 business days. We ask that you follow-up with your pharmacy.

## 2024-03-08 MED ORDER — MOUNJARO 7.5 MG/0.5ML ~~LOC~~ SOAJ: 7.5000 mg | SUBCUTANEOUS | 1 refills | Status: DC

## 2024-04-06 ENCOUNTER — Other Ambulatory Visit: Payer: Self-pay

## 2024-04-06 DIAGNOSIS — I10 Essential (primary) hypertension: Secondary | ICD-10-CM

## 2024-04-06 DIAGNOSIS — N529 Male erectile dysfunction, unspecified: Secondary | ICD-10-CM

## 2024-04-06 NOTE — Telephone Encounter (Signed)
 LAST APPOINTMENT DATE: 02/05/2024  NEXT APPOINTMENT DATE: 08/05/2024 Pt requests for scripts sent to [facility] pharmacy home delivery   Amlodipine  LAST REFILL: 11/10/23 QTY: 90 #1RF   Ramipril   LAST REFILL: 12/23/23 QTY: 90 #1  Tadalafil  LAST REFILL:01/14/24 QTY: 30 #0RF

## 2024-04-07 MED ORDER — RAMIPRIL 5 MG PO CAPS: 5.0000 mg | ORAL_CAPSULE | Freq: Every day | ORAL | 1 refills | Status: DC

## 2024-04-07 MED ORDER — AMLODIPINE BESYLATE 10 MG PO TABS: 10.0000 mg | ORAL_TABLET | Freq: Every day | ORAL | 1 refills | Status: DC

## 2024-04-07 MED ORDER — TADALAFIL 10 MG PO TABS: 10.0000 mg | ORAL_TABLET | ORAL | 0 refills | Status: DC | PRN

## 2024-04-08 NOTE — Telephone Encounter (Signed)
 Last read by Denis Frieling at 2:23PM on 04/06/2024.

## 2024-06-04 ENCOUNTER — Other Ambulatory Visit: Payer: Self-pay | Admitting: Nurse Practitioner

## 2024-06-04 DIAGNOSIS — I10 Essential (primary) hypertension: Secondary | ICD-10-CM

## 2024-06-12 ENCOUNTER — Other Ambulatory Visit: Payer: Self-pay | Admitting: Nurse Practitioner

## 2024-06-12 DIAGNOSIS — I10 Essential (primary) hypertension: Secondary | ICD-10-CM

## 2024-06-14 ENCOUNTER — Other Ambulatory Visit: Payer: Self-pay | Admitting: Nurse Practitioner

## 2024-06-14 DIAGNOSIS — N529 Male erectile dysfunction, unspecified: Secondary | ICD-10-CM

## 2024-06-17 ENCOUNTER — Telehealth: Payer: Self-pay

## 2024-06-17 ENCOUNTER — Other Ambulatory Visit: Payer: Self-pay

## 2024-06-17 DIAGNOSIS — E119 Type 2 diabetes mellitus without complications: Secondary | ICD-10-CM

## 2024-06-17 MED ORDER — METFORMIN HCL ER 500 MG PO TB24: 1000.0000 mg | ORAL_TABLET | Freq: Two times a day (BID) | ORAL | 1 refills | Status: DC

## 2024-06-17 MED ORDER — METOPROLOL SUCCINATE ER 25 MG PO TB24: 25.0000 mg | ORAL_TABLET | Freq: Every day | ORAL | 2 refills | Status: AC

## 2024-06-17 NOTE — Telephone Encounter (Signed)
 LAST APPOINTMENT DATE: 06/14/2024 NEXT APPOINTMENT DATE: 08/05/2024  Metformin  XR 500 MG 24 HR TABLET LAST REFILL: 11/20/23 QTY: 360 1RF  Prescription request to [facility] by [employer].

## 2024-06-24 ENCOUNTER — Other Ambulatory Visit: Payer: Self-pay

## 2024-06-25 MED ORDER — ICOSAPENT ETHYL 1 G PO CAPS: 2.0000 g | ORAL_CAPSULE | Freq: Two times a day (BID) | ORAL | 1 refills | Status: DC

## 2024-06-30 ENCOUNTER — Other Ambulatory Visit (HOSPITAL_COMMUNITY): Payer: Self-pay

## 2024-06-30 ENCOUNTER — Telehealth: Payer: Self-pay | Admitting: Pharmacy Technician

## 2024-06-30 NOTE — Telephone Encounter (Signed)
   Pharmacy Patient Advocate Encounter   Received notification from Onbase that prior authorization for ICOSAPENT  1GM is required/requested.   Insurance verification completed.   The patient is insured through [facility] .   Per test claim: PA required; PA submitted to above mentioned insurance via Latent Key/confirmation #/EOC [id number] Status is pending

## 2024-06-30 NOTE — Telephone Encounter (Signed)
 Pharmacy Patient Advocate Encounter  Received notification from [employer] that Prior Authorization for icosapent  1gm has been APPROVED from 06/30/24 to 11/17/2038   PA #/Case ID/Reference #: pt just got 06/25/24

## 2024-07-05 ENCOUNTER — Other Ambulatory Visit: Payer: Self-pay | Admitting: Cardiology

## 2024-07-06 MED ORDER — FENOFIBRATE 145 MG PO TABS: 145.0000 mg | ORAL_TABLET | Freq: Every day | ORAL | 1 refills | Status: AC

## 2024-07-21 ENCOUNTER — Ambulatory Visit (INDEPENDENT_AMBULATORY_CARE_PROVIDER_SITE_OTHER): Payer: Self-pay | Admitting: Adult Health

## 2024-07-21 ENCOUNTER — Other Ambulatory Visit: Payer: Self-pay

## 2024-07-21 VITALS — BP 130/77 | HR 66 | Temp 95.6°F | Ht 70.0 in | Wt 210.0 lb

## 2024-07-21 DIAGNOSIS — S8991XA Unspecified injury of right lower leg, initial encounter: Secondary | ICD-10-CM

## 2024-07-21 NOTE — Progress Notes (Signed)
 [facility] [address]  [place], [place] [address] Phone [phone] Fax  [phone]  Worker's Compensation Report Form   Cory Houston Date of Birth:October 04, 2056 Phone Number:[phone] Email:[email or web address] Department:[employer] Job Title:Landscape turf supervisor Supervisor:[name] Camera operator Notified:Yes  Date of Injury:07/21/2024 Time of Injury:7:50am Shift Worked:6:00am-2:30 Location where injury occurred (address or landmark):[place].  Body Part Injured:Right Lower leg  Vital Signs BP 130/77   Pulse 66   Temp (!) 95.6 F (35.3 C)   Ht 5' 10 (1.778 m)   Wt 210 lb (95.3 kg)   SpO2 98%   BMI 30.13 kg/m   Injury Description  [name] was standing on an Upright Aerator machine.  He was reversing to get into position for his next pass, and he back into a park bench.  He squeezed his leg between the bench and the machine.    Provider Note Abrasion to posterior right leg with tiny opening noted.  Small blood well controlled.    Diagnosis  1. Leg injury, right, initial encounter (Primary) Cleaned and dressed with triple antibiotic cream.  Covered with Telfa dressing and coban.      Medications Prescribed  No orders of the defined types were placed in this encounter.   Referred to: No referral at this time.     Return to Work Status Return to work now, no restrictions.    Provider Signature ________________________________________Date_________   Employee Signature _______________________________________Date_________   Please email this completed form to Berwyn Coach, Director of Risk Management at [email or web address] within 24 hours of visit.

## 2024-08-05 ENCOUNTER — Encounter: Payer: Self-pay | Admitting: Nurse Practitioner

## 2024-08-05 ENCOUNTER — Ambulatory Visit: Admitting: Nurse Practitioner

## 2024-08-05 VITALS — BP 122/70 | HR 58 | Temp 97.7°F | Ht 69.0 in | Wt 216.4 lb

## 2024-08-05 DIAGNOSIS — N529 Male erectile dysfunction, unspecified: Secondary | ICD-10-CM

## 2024-08-05 DIAGNOSIS — G4733 Obstructive sleep apnea (adult) (pediatric): Secondary | ICD-10-CM

## 2024-08-05 DIAGNOSIS — Z23 Encounter for immunization: Secondary | ICD-10-CM | POA: Diagnosis not present

## 2024-08-05 DIAGNOSIS — I10 Essential (primary) hypertension: Secondary | ICD-10-CM

## 2024-08-05 DIAGNOSIS — Z0000 Encounter for general adult medical examination without abnormal findings: Secondary | ICD-10-CM

## 2024-08-05 DIAGNOSIS — E781 Pure hyperglyceridemia: Secondary | ICD-10-CM | POA: Diagnosis not present

## 2024-08-05 DIAGNOSIS — Z7984 Long term (current) use of oral hypoglycemic drugs: Secondary | ICD-10-CM | POA: Diagnosis not present

## 2024-08-05 DIAGNOSIS — K219 Gastro-esophageal reflux disease without esophagitis: Secondary | ICD-10-CM

## 2024-08-05 DIAGNOSIS — Z7985 Long-term (current) use of injectable non-insulin antidiabetic drugs: Secondary | ICD-10-CM

## 2024-08-05 DIAGNOSIS — E119 Type 2 diabetes mellitus without complications: Secondary | ICD-10-CM

## 2024-08-05 DIAGNOSIS — Z125 Encounter for screening for malignant neoplasm of prostate: Secondary | ICD-10-CM

## 2024-08-05 LAB — CBC WITH DIFFERENTIAL/PLATELET
Basophils Absolute: 0 K/uL (ref 0.0–0.1)
Basophils Relative: 0.3 % (ref 0.0–3.0)
Eosinophils Absolute: 0.1 K/uL (ref 0.0–0.7)
Eosinophils Relative: 0.9 % (ref 0.0–5.0)
HCT: 43.2 % (ref 39.0–52.0)
Hemoglobin: 14.7 g/dL (ref 13.0–17.0)
Lymphocytes Relative: 19.5 % (ref 12.0–46.0)
Lymphs Abs: 1.3 K/uL (ref 0.7–4.0)
MCHC: 34 g/dL (ref 30.0–36.0)
MCV: 85.4 fl (ref 78.0–100.0)
Monocytes Absolute: 0.6 K/uL (ref 0.1–1.0)
Monocytes Relative: 9.8 % (ref 3.0–12.0)
Neutro Abs: 4.5 K/uL (ref 1.4–7.7)
Neutrophils Relative %: 69.5 % (ref 43.0–77.0)
Platelets: 221 K/uL (ref 150.0–400.0)
RBC: 5.06 Mil/uL (ref 4.22–5.81)
RDW: 14 % (ref 11.5–15.5)
WBC: 6.5 K/uL (ref 4.0–10.5)

## 2024-08-05 LAB — COMPREHENSIVE METABOLIC PANEL WITH GFR
ALT: 67 U/L — ABNORMAL HIGH (ref 0–53)
AST: 47 U/L — ABNORMAL HIGH (ref 0–37)
Albumin: 4.8 g/dL (ref 3.5–5.2)
Alkaline Phosphatase: 66 U/L (ref 39–117)
BUN: 21 mg/dL (ref 6–23)
CO2: 25 meq/L (ref 19–32)
Calcium: 9.4 mg/dL (ref 8.4–10.5)
Chloride: 102 meq/L (ref 96–112)
Creatinine, Ser: 1.12 mg/dL (ref 0.40–1.50)
GFR: 68.73 mL/min (ref >60.00)
Glucose, Bld: 131 mg/dL — ABNORMAL HIGH (ref 70–99)
Potassium: 4.3 meq/L (ref 3.5–5.1)
Sodium: 135 meq/L (ref 135–145)
Total Bilirubin: 0.8 mg/dL (ref 0.2–1.2)
Total Protein: 7.3 g/dL (ref 6.0–8.3)

## 2024-08-05 LAB — LIPID PANEL
Cholesterol: 89 mg/dL (ref 0–200)
HDL: 23.5 mg/dL — ABNORMAL LOW (ref >39.00)
LDL Cholesterol: 5 mg/dL (ref 0–99)
NonHDL: 65.23
Total CHOL/HDL Ratio: 4
Triglycerides: 300 mg/dL — ABNORMAL HIGH (ref 0.0–149.0)
VLDL: 60 mg/dL — ABNORMAL HIGH (ref 0.0–40.0)

## 2024-08-05 LAB — PSA: PSA: 0.36 ng/mL (ref 0.10–4.00)

## 2024-08-05 LAB — MICROALBUMIN / CREATININE URINE RATIO
Creatinine,U: 78.2 mg/dL
Microalb Creat Ratio: UNDETERMINED mg/g (ref 0.0–30.0)
Microalb, Ur: <0.7 mg/dL

## 2024-08-05 LAB — POCT GLYCOSYLATED HEMOGLOBIN (HGB A1C): Hemoglobin A1C: 6.6 % — AB (ref 4.0–5.6)

## 2024-08-05 LAB — TSH: TSH: 1.62 u[IU]/mL (ref 0.35–5.50)

## 2024-08-05 MED ORDER — EMPAGLIFLOZIN 10 MG PO TABS: 10.0000 mg | ORAL_TABLET | Freq: Every day | ORAL | 2 refills | Status: AC

## 2024-08-05 NOTE — Progress Notes (Signed)
 Established Patient Office Visit  Subjective   Patient ID: Cory Houston, male    DOB: 11/16/1956  Age: 68 y.o. MRN: [id number]  Chief Complaint  Patient presents with   Annual Exam   Medication Management    Pt requests for jardiance  to be transferred to [facility].     HPI  HTN: Patient currently maintained on amlodipine  10 mg daily, metoprolol  25 mg daily, ramipril  5 mg daily. Does not check it at home but does have a cuff. He will chek it once every 2 weeks   OSA: no longer uses due to weight loss   GERD: Patient currently maintained on omeprazole  20 milligrams daily  DM2: Currently maintained on Jardiance  10 mg daily, metformin  1000 mg twice daily, tirzepatide  7.5 mg weekly. He does check it once a week at home. States that he is getting below 100.  HLD: Patient currently maintained on ezetimibe  10 mg daily, fenofibrate  145 mg daily, Vascepa  2 g twice daily.  He is followed by cardiology  ED: Patient used tadalafil  10 mg as needed sexual intercourse  for complete physical and follow up of chronic conditions.  Immunizations: -Tetanus: Completed in 2023 -Influenza:  -Shingles: Completed Shingrix  series -Pneumonia: Completed 2020.  Due for update today  Diet: Fair diet. He is eating 3 meals a day. States some days he will eat 4 meals. He has started snacking again. Exercise: he does walk a lot at work. Yesterday he did 7.5 miles. Generally 5-7  Eye exam: needs updating. He does galsses  Dental exam: Completes 3 times a year   Colonoscopy: Completed in 01/10/2023, repeat in 3 years.  Due 2027 Lung Cancer Screening: N/A  PSA: Due  Sleep: going to bed around 930 and get yup arounnd 430am. Does not snore. States that he feels rested      08/05/2024    8:15 AM 02/05/2024    8:27 AM 10/08/2023    3:27 PM  PHQ9 SCORE ONLY  PHQ-9 Total Score 1 0  0      Data saved with a previous flowsheet row definition       08/05/2024    8:15 AM 02/05/2024    8:30 AM  10/08/2023    3:27 PM 04/21/2023    8:48 AM  GAD 7 : Generalized Anxiety Score  Nervous, Anxious, on Edge 0 0 0 0  Control/stop worrying 0 0 0 0  Worry too much - different things 0 0 0 0  Trouble relaxing 0 0 0 0  Restless 0 0 0 0  Easily annoyed or irritable 0 0 0 0  Afraid - awful might happen 0 0 0 0  Total GAD 7 Score 0 0 0 0  Anxiety Difficulty Not difficult at all Not difficult at all Not difficult at all Not difficult at all          Review of Systems  Constitutional:  Negative for chills and fever.  Respiratory:  Negative for shortness of breath.   Cardiovascular:  Negative for chest pain and leg swelling.  Gastrointestinal:  Negative for abdominal pain, blood in stool, constipation, diarrhea, nausea and vomiting.       BM daily   Genitourinary:  Negative for dysuria and hematuria.  Neurological:  Negative for dizziness, tingling and headaches.  Psychiatric/Behavioral:  Negative for hallucinations and suicidal ideas.       Objective:     BP 122/70   Pulse (!) 58   Temp 97.7 F (36.5 C) (Oral)  Ht 5' 9 (1.753 m)   Wt 216 lb 6.4 oz (98.2 kg)   SpO2 96%   BMI 31.96 kg/m  BP Readings from Last 3 Encounters:  08/05/24 122/70  07/21/24 130/77  02/09/24 (!) 145/75   Wt Readings from Last 3 Encounters:  08/05/24 216 lb 6.4 oz (98.2 kg)  07/21/24 210 lb (95.3 kg)  02/05/24 210 lb 12.8 oz (95.6 kg)   SpO2 Readings from Last 3 Encounters:  08/05/24 96%  07/21/24 98%  02/09/24 96%      Physical Exam Vitals and nursing note reviewed.  Constitutional:      Appearance: Normal appearance.  HENT:     Right Ear: Tympanic membrane, ear canal and external ear normal.     Left Ear: Tympanic membrane, ear canal and external ear normal.     Mouth/Throat:     Mouth: Mucous membranes are moist.     Pharynx: Oropharynx is clear.  Eyes:     Extraocular Movements: Extraocular movements intact.     Pupils: Pupils are equal, round, and reactive to light.   Cardiovascular:     Rate and Rhythm: Normal rate and regular rhythm.     Pulses: Normal pulses.     Heart sounds: Normal heart sounds.  Pulmonary:     Effort: Pulmonary effort is normal.     Breath sounds: Normal breath sounds.  Abdominal:     General: Bowel sounds are normal. There is no distension.     Palpations: There is no mass.     Tenderness: There is no abdominal tenderness.     Hernia: No hernia is present.  Musculoskeletal:     Right lower leg: No edema.     Left lower leg: No edema.  Lymphadenopathy:     Cervical: No cervical adenopathy.  Skin:    General: Skin is warm.  Neurological:     General: No focal deficit present.     Mental Status: He is alert.     Deep Tendon Reflexes:     Reflex Scores:      Bicep reflexes are 2+ on the right side and 2+ on the left side.      Patellar reflexes are 2+ on the right side and 2+ on the left side.    Comments: Bilateral upper and lower extremity strength 5/5  Psychiatric:        Mood and Affect: Mood normal.        Behavior: Behavior normal.        Thought Content: Thought content normal.        Judgment: Judgment normal.    Title   Diabetic Foot Exam - detailed Is there a history of foot ulcer?: No Is there a foot ulcer now?: No Is there swelling?: No Is there elevated skin temperature?: No Is there abnormal foot shape?: No Is there a claw toe deformity?: No Are the toenails long?: Yes Are the toenails thick?: No Are the toenails ingrown?: No Pulse Foot Exam completed.: Yes   Right Posterior Tibialis: Present Left posterior Tibialis: Present   Right Dorsalis Pedis: Present Left Dorsalis Pedis: Present     Semmes-Weinstein Monofilament Test + means has sensation and - means no sensation      Image components are not supported.   Image components are not supported. Image components are not supported.  Tuning Fork Comments All 10 sites tested sensation intact bilaterally     Results for orders  placed or performed in visit on 08/05/24  CBC with Differential/Platelet  Result Value Ref Range   WBC 6.5 4.0 - 10.5 K/uL   RBC 5.06 4.22 - 5.81 Mil/uL   Hemoglobin 14.7 13.0 - 17.0 g/dL   HCT 56.7 60.9 - 47.9 %   MCV 85.4 78.0 - 100.0 fl   MCHC 34.0 30.0 - 36.0 g/dL   RDW 85.9 88.4 - 84.4 %   Platelets 221.0 150.0 - 400.0 K/uL   Neutrophils Relative % 69.5 43.0 - 77.0 %   Lymphocytes Relative 19.5 12.0 - 46.0 %   Monocytes Relative 9.8 3.0 - 12.0 %   Eosinophils Relative 0.9 0.0 - 5.0 %   Basophils Relative 0.3 0.0 - 3.0 %   Neutro Abs 4.5 1.4 - 7.7 K/uL   Lymphs Abs 1.3 0.7 - 4.0 K/uL   Monocytes Absolute 0.6 0.1 - 1.0 K/uL   Eosinophils Absolute 0.1 0.0 - 0.7 K/uL   Basophils Absolute 0.0 0.0 - 0.1 K/uL  Comprehensive metabolic panel with GFR  Result Value Ref Range   Sodium 135 135 - 145 mEq/L   Potassium 4.3 3.5 - 5.1 mEq/L   Chloride 102 96 - 112 mEq/L   CO2 25 19 - 32 mEq/L   Glucose, Bld 131 (H) 70 - 99 mg/dL   BUN 21 6 - 23 mg/dL   Creatinine, Ser 8.87 0.40 - 1.50 mg/dL   Total Bilirubin 0.8 0.2 - 1.2 mg/dL   Alkaline Phosphatase 66 39 - 117 U/L   AST 47 (H) 0 - 37 U/L   ALT 67 (H) 0 - 53 U/L   Total Protein 7.3 6.0 - 8.3 g/dL   Albumin 4.8 3.5 - 5.2 g/dL   GFR 31.26 >39.99 mL/min   Calcium  9.4 8.4 - 10.5 mg/dL  TSH  Result Value Ref Range   TSH 1.62 0.35 - 5.50 uIU/mL  Microalbumin / creatinine urine ratio  Result Value Ref Range   Microalb, Ur <0.7 mg/dL   Creatinine,U 21.7 mg/dL   Microalb Creat Ratio Unable to calculate 0.0 - 30.0 mg/g  PSA  Result Value Ref Range   PSA 0.36 0.10 - 4.00 ng/mL  Lipid panel  Result Value Ref Range   Cholesterol 89 0 - 200 mg/dL   Triglycerides 699.9 (H) 0.0 - 149.0 mg/dL   HDL 76.49 (L) >60.99 mg/dL   VLDL 39.9 (H) 0.0 - 59.9 mg/dL   LDL Cholesterol 5 0 - 99 mg/dL   Total CHOL/HDL Ratio 4    NonHDL 65.23   POCT glycosylated hemoglobin (Hb A1C)  Result Value Ref Range   Hemoglobin A1C 6.6 (A) 4.0 - 5.6 %   HbA1c  POC (<> result, manual entry)     HbA1c, POC (prediabetic range)     HbA1c, POC (controlled diabetic range)        The ASCVD Risk score (Arnett DK, et al., 2019) failed to calculate for the following reasons:   The valid total cholesterol range is 130 to 320 mg/dL    Assessment & Plan:   Problem List Items Addressed This Visit       Cardiovascular and Mediastinum   Hypertension   Patient currently maintained amlodipine  10 mg daily, metoprolol  25 mg daily, ramipril  5 mg daily.  Blood pressure controlled.  Continue medication as prescribed      Relevant Orders   CBC with Differential/Platelet (Completed)   Comprehensive metabolic panel with GFR (Completed)   TSH (Completed)     Respiratory   OSA (obstructive sleep apnea)   History of  same no longer using since weight loss.      Relevant Orders   CBC with Differential/Platelet (Completed)     Digestive   Gastroesophageal reflux disease   Currently maintained on omeprazole  20 mg daily.  Stable continue        Endocrine   Well controlled type 2 diabetes mellitus without evidence of diabetic end organ manifestation [facility])   Patient currently maintained on Mounjaro  7.5 mg weekly, metformin  1000 mg twice daily, Jardiance  10 mg daily.  A1c well-controlled.  Patient tolerating medications well.  Continue medication as prescribed      Relevant Medications   empagliflozin  (JARDIANCE ) 10 MG TABS tablet   Other Relevant Orders   POCT glycosylated hemoglobin (Hb A1C) (Completed)   Microalbumin / creatinine urine ratio (Completed)   Lipid panel (Completed)     Other   Hypertriglyceridemia   History of the same.  Pending lipid panel.  Patient currently maintained on ezetimibe  10 mg daily, fenofibrate  145 mg daily, Vascepa  2 g twice daily.      Relevant Orders   Lipid panel (Completed)   Preventative health care - Primary   Discussed age-appropriate immunizations and screening exams.  Did review patient's personal, surgical,  social, family histories.  Patient is up-to-date on all age-appropriate vaccinations he would like.  Administer Prevnar 20 today along with influenza vaccine.  Patient up-to-date on CRC screening.  PSA for prostate cancer screening today.  Patient was given information at discharge about preventative healthcare maintenance with anticipatory guidance.      Relevant Orders   CBC with Differential/Platelet (Completed)   Comprehensive metabolic panel with GFR (Completed)   TSH (Completed)   Erectile dysfunction   Currently maintained on tadalafil  10 mg daily as needed sexual intercourse.  Continue      Other Visit Diagnoses       Screening for prostate cancer       Relevant Orders   PSA (Completed)     Need for influenza vaccination       Relevant Orders   Flu vaccine trivalent PF, 6mos and older(Flulaval,Afluria,Fluarix,Fluzone) (Completed)     Need for vaccination against Streptococcus pneumoniae       Relevant Orders   Pneumococcal conjugate vaccine 20-valent (Prevnar 20) (Completed)       Return in about 6 months (around 02/02/2025) for DM recheck.    Adina Crandall, NP

## 2024-08-05 NOTE — Assessment & Plan Note (Signed)
 Currently maintained on omeprazole  20 mg daily.  Stable continue

## 2024-08-05 NOTE — Assessment & Plan Note (Signed)
 Currently maintained on tadalafil  10 mg daily as needed sexual intercourse.  Continue

## 2024-08-05 NOTE — Assessment & Plan Note (Signed)
 Patient currently maintained on Mounjaro  7.5 mg weekly, metformin  1000 mg twice daily, Jardiance  10 mg daily.  A1c well-controlled.  Patient tolerating medications well.  Continue medication as prescribed

## 2024-08-05 NOTE — Patient Instructions (Signed)
 Nice to see you today  I will be in touch with the labs once I have reviewed them We did update your flu and pneumonia vaccine today  Follow up with me in 6 months, sooner if you need me

## 2024-08-05 NOTE — Assessment & Plan Note (Signed)
 Patient currently maintained amlodipine  10 mg daily, metoprolol  25 mg daily, ramipril  5 mg daily.  Blood pressure controlled.  Continue medication as prescribed

## 2024-08-05 NOTE — Assessment & Plan Note (Signed)
 History of the same.  Pending lipid panel.  Patient currently maintained on ezetimibe  10 mg daily, fenofibrate  145 mg daily, Vascepa  2 g twice daily.

## 2024-08-05 NOTE — Assessment & Plan Note (Signed)
 History of same no longer using since weight loss.

## 2024-08-05 NOTE — Assessment & Plan Note (Signed)
 Discussed age-appropriate immunizations and screening exams.  Did review patient's personal, surgical, social, family histories.  Patient is up-to-date on all age-appropriate vaccinations he would like.  Administer Prevnar 20 today along with influenza vaccine.  Patient up-to-date on CRC screening.  PSA for prostate cancer screening today.  Patient was given information at discharge about preventative healthcare maintenance with anticipatory guidance.

## 2024-08-06 ENCOUNTER — Ambulatory Visit: Payer: Self-pay | Admitting: Nurse Practitioner

## 2024-10-01 NOTE — Progress Notes (Signed)
 Leonardo Makris                                          MRN: [id number]   10/01/2024   The [facility] Quality Team Specialist reviewed this patient medical record for the purposes of chart review for care gap closure. The following were reviewed: abstraction for care gap closure-kidney health evaluation for diabetes:eGFR  and uACR.    [facility] Quality Team

## 2024-11-07 ENCOUNTER — Other Ambulatory Visit: Payer: Self-pay | Admitting: Cardiology

## 2024-11-25 ENCOUNTER — Other Ambulatory Visit: Payer: Self-pay | Admitting: Nurse Practitioner

## 2024-11-25 DIAGNOSIS — I10 Essential (primary) hypertension: Secondary | ICD-10-CM

## 2024-11-28 ENCOUNTER — Other Ambulatory Visit: Payer: Self-pay | Admitting: Nurse Practitioner

## 2024-12-09 ENCOUNTER — Other Ambulatory Visit: Payer: Self-pay | Admitting: Nurse Practitioner

## 2024-12-09 DIAGNOSIS — N529 Male erectile dysfunction, unspecified: Secondary | ICD-10-CM

## 2024-12-11 ENCOUNTER — Other Ambulatory Visit: Payer: Self-pay | Admitting: Nurse Practitioner

## 2024-12-11 DIAGNOSIS — E119 Type 2 diabetes mellitus without complications: Secondary | ICD-10-CM

## 2024-12-13 ENCOUNTER — Other Ambulatory Visit: Payer: Self-pay | Admitting: Cardiology

## 2024-12-15 ENCOUNTER — Other Ambulatory Visit: Payer: Self-pay | Admitting: Cardiology

## 2025-02-02 ENCOUNTER — Ambulatory Visit: Admitting: Nurse Practitioner
